# Patient Record
Sex: Female | Born: 1970
Health system: Southern US, Community
[De-identification: ages and names within clinical notes are randomized; demographics above are authoritative.]

## PROBLEM LIST (undated history)

## (undated) DIAGNOSIS — K561 Intussusception: Secondary | ICD-10-CM

## (undated) DIAGNOSIS — K559 Vascular disorder of intestine, unspecified: Secondary | ICD-10-CM

## (undated) DIAGNOSIS — K589 Irritable bowel syndrome without diarrhea: Secondary | ICD-10-CM

## (undated) DIAGNOSIS — K8020 Calculus of gallbladder without cholecystitis without obstruction: Secondary | ICD-10-CM

## (undated) DIAGNOSIS — J309 Allergic rhinitis, unspecified: Secondary | ICD-10-CM

## (undated) DIAGNOSIS — J45909 Unspecified asthma, uncomplicated: Secondary | ICD-10-CM

## (undated) HISTORY — DX: Vascular disorder of intestine, unspecified: K55.9

## (undated) HISTORY — DX: Irritable bowel syndrome, unspecified: K58.9

## (undated) HISTORY — DX: Intussusception: K56.1

## (undated) HISTORY — DX: Allergic rhinitis, unspecified: J30.9

## (undated) HISTORY — DX: Unspecified asthma, uncomplicated: J45.909

## (undated) HISTORY — DX: Calculus of gallbladder without cholecystitis without obstruction: K80.20

---

## 2007-04-23 DIAGNOSIS — K559 Vascular disorder of intestine, unspecified: Secondary | ICD-10-CM

## 2007-04-23 DIAGNOSIS — K561 Intussusception: Secondary | ICD-10-CM

## 2007-04-23 DIAGNOSIS — K8020 Calculus of gallbladder without cholecystitis without obstruction: Secondary | ICD-10-CM

## 2007-04-23 HISTORY — DX: Vascular disorder of intestine, unspecified: K55.9

## 2007-04-23 HISTORY — PX: COLONOSCOPY: SHX174

## 2007-04-23 HISTORY — DX: Intussusception: K56.1

## 2007-04-23 HISTORY — DX: Calculus of gallbladder without cholecystitis without obstruction: K80.20

## 2007-04-25 ENCOUNTER — Inpatient Hospital Stay (HOSPITAL_COMMUNITY): Admission: EM | Admit: 2007-04-25 | Discharge: 2007-04-27 | Payer: Self-pay | Admitting: Emergency Medicine

## 2007-04-27 ENCOUNTER — Encounter: Payer: Self-pay | Admitting: Internal Medicine

## 2007-05-01 ENCOUNTER — Ambulatory Visit: Payer: Self-pay | Admitting: Internal Medicine

## 2007-05-13 ENCOUNTER — Ambulatory Visit: Payer: Self-pay | Admitting: Internal Medicine

## 2007-05-13 ENCOUNTER — Encounter: Payer: Self-pay | Admitting: Internal Medicine

## 2007-05-13 DIAGNOSIS — R142 Eructation: Secondary | ICD-10-CM

## 2007-05-13 DIAGNOSIS — R143 Flatulence: Secondary | ICD-10-CM

## 2007-05-13 DIAGNOSIS — R141 Gas pain: Secondary | ICD-10-CM | POA: Insufficient documentation

## 2007-05-13 DIAGNOSIS — K55059 Acute (reversible) ischemia of intestine, part and extent unspecified: Secondary | ICD-10-CM | POA: Insufficient documentation

## 2007-05-13 DIAGNOSIS — K8000 Calculus of gallbladder with acute cholecystitis without obstruction: Secondary | ICD-10-CM | POA: Insufficient documentation

## 2007-05-13 DIAGNOSIS — J309 Allergic rhinitis, unspecified: Secondary | ICD-10-CM | POA: Insufficient documentation

## 2007-05-13 DIAGNOSIS — K561 Intussusception: Secondary | ICD-10-CM | POA: Insufficient documentation

## 2007-06-25 ENCOUNTER — Encounter: Payer: Self-pay | Admitting: Internal Medicine

## 2007-06-25 ENCOUNTER — Ambulatory Visit: Payer: Self-pay | Admitting: Internal Medicine

## 2007-06-25 DIAGNOSIS — K589 Irritable bowel syndrome without diarrhea: Secondary | ICD-10-CM | POA: Insufficient documentation

## 2008-09-13 ENCOUNTER — Encounter: Admission: RE | Admit: 2008-09-13 | Discharge: 2008-09-13 | Payer: Self-pay | Admitting: Family Medicine

## 2010-09-04 NOTE — Discharge Summary (Signed)
[name], [name] NO.:  [id number]   MEDICAL RECORD NO.:  [id number]          PATIENT TYPE:  INP   LOCATION:  1501                         FACILITY:  [facility]   PHYSICIAN:  Beckey Rutter, MD  DATE OF BIRTH:  1970/12/04   DATE OF ADMISSION:  04/25/2007  DATE OF DISCHARGE:  04/27/2007                               DISCHARGE SUMMARY   CHIEF COMPLAINT:  Abdominal pain.   HISTORY OF PRESENT ILLNESS:  A 39 year old who came in with abdominal  pain to the emergency room.  The abdominal pain was associated with  increased loose stool.   HOSPITAL COURSE:  During hospitalization, the patient was started on  antibiotics because of suspicion of infectious colitis.  The patient  underwent colonoscopy today.  The result was indicative for ischemic  colitis.  Please refer to the result of the colonoscopy report down  below.   The patient today is stable after the colonoscopy.  Her abdomen is soft.  She tolerated low-residue meals very good with no pain.  The patient  will be discharged today after her second meal if she remained without  pain as well.  The plan is to follow up with Dr. Leone Payor on January 21,  at 2:15 p.m.  The patient is aware and agreeable to the plan.   The patient will be discharged with Levsin sublingual p.r.n.   CONSULTATIONS:  Gastroenterology.  The patient had colonoscopy done  today by Dr. Leone Payor.   PROCEDURES:  1. Colonoscopy showing one intermittent inflammatory change and some      linear ulcer from sigmoid to the transverse colon about 40 cm.      Clinical scenario seems most compatible with ischemic colitis. but      inflammatory bowel disease like Crohn's could show these changes      also.  Otherwise okay at the terminal ileum.  I think jejunal      intussusception and gallstones are incidental findings and not      related to her colitis.  2. CT of her abdomen and pelvis with findings of cholelithiasis,      segment of jejunal  intussusception in the left mid abdomen which      has been described as physiologic process or incidental finding,      but recommend clinical correlation and potential additional imaging      as clinically warranted.  Bowel wall thickening of transverse      descending colon compatible with colitis.  Differential diagnosis      favoring infectious versus inflammatory bowel disease in a patient      of this age.  Her CT pelvis reading nonspecific free pelvic fluid,      colonic wall thickening compatible with colitis of the transverse      and descending colon.  In regards to her cholelithiasis and      intussusception, it was felt this is an incidental finding.  The      patient now is stable, tolerating food and she agreed to the plan      of the followup for further address  of this issue if needed.   DISCHARGE MEDICATIONS:  Levsin 0.125 mg sublingual q.4 h. p.r.n.,  prescription given.   DISCHARGE DIAGNOSES:  1. Ischemic colitis with the possibility of inflammatory bowel disease      and Crohn's disease to be further ruled out.  2. Cholelithiasis.  3. Intussusception, resolved.   FOLLOW UP:  The patient has agreed to the discharge plan and she agreed  to follow up with Dr. Leone Payor on January 21, at 2:15 p.m.      Beckey Rutter, MD  Electronically Signed     EME/MEDQ  D:  04/27/2007  T:  04/28/2007  Job:  [id number]

## 2010-09-04 NOTE — H&P (Signed)
[name], [name] NO.:  [id number]   MEDICAL RECORD NO.:  [id number]          PATIENT TYPE:  INP   LOCATION:  1501                         FACILITY:  [facility]   PHYSICIAN:  Della Goo, M.D. DATE OF BIRTH:  [birth year]   DATE OF ADMISSION:  04/25/2007  DATE OF DISCHARGE:                              HISTORY & PHYSICAL   CHIEF COMPLAINT:  Abdominal pain.   HISTORY OF PRESENT ILLNESS:  This is a 40 year old female presenting to  the emergency department with complaints of severe, left, lower quadrant  abdominal pain since January 1, associated with increased loose stools  three daily.  She reports having severe low back pain on December 31,  and presented to the [facility], was evaluated and given  medication of Hydrocodone, Flexeril and diclofenac.  She began to  develop left, lower quadrant pain the next day.  She reports that the  back pain resolved, however, she began to have constant left, lower  quadrant abdominal pain.  She denies having any melena, any nausea or  vomiting.  She denies having any fevers or chills.   PAST MEDICAL HISTORY:  None.   PAST SURGICAL HISTORY:  None.   MEDICATIONS:  Mentioned above, also Loestrin FE 1/20, 28-day pack.   ALLERGIES:  NO KNOWN DRUG ALLERGIES.   SOCIAL HISTORY:  Married, nonsmoker, nondrinker.   FAMILY HISTORY:  No history of coronary artery disease, hypertension,  diabetes or cancer that she knows of.  No history of colitis that she  knows of.   PHYSICAL EXAMINATION:  GENERAL:  This is a pleasant, well-nourished,  well-developed, 40 year old female in discomfort, but no acute distress.  VITAL SIGNS:  Temperature 99.2, blood pressure 121/60, heart rate 115,  respirations 16, O2 saturations 100% on room air.  HEENT:  Normocephalic, atraumatic.  Pupils equally round reactive to light.  Extraocular muscles are intact.  There is no scleral icterus.  Funduscopic benign.  Oropharynx is clear.  NECK:  Supple with full range of motion.  No thyromegaly, adenopathy,  jugular venous distention.  CARDIOVASCULAR:  Mild tachycardiac rate and rhythm.  No murmurs, gallops  or rubs.  LUNGS:  Clear to auscultation bilaterally.  ABDOMEN:  Positive bowel sounds, mild tenderness in left lower quadrant  area.  No guarding or rebound.  No hepatosplenomegaly. EXTREMITIES:  Without cyanosis, clubbing or edema.  NEUROLOGIC:  Nonfocal.   LABORATORY DATA AND X-RAY FINDINGS:  White blood cell count 21.3,  hemoglobin 12.9, hematocrit 37, platelets 275, neutrophils 91%,  lymphocytes 7%.  Sodium 134, potassium 4.1, carbon dioxide 27, chloride  102, BUN 9, creatinine 0.85 and glucose 105.  Albumin 3.2, AST 80, ALT  18.  Urinalysis with small urine hemoglobin, otherwise negative.  Urine  pregnancy negative.   CT scan of the abdomen and pelvis reveals cholelithiasis, jejunal  intussusception of the left mid abdomen that was incidentally found,  bowel wall thickening of the transverse and descending colon compatible  with colitis.  No pelvic mass, adenopathy or hernia seen in the pelvic  portion.  Nonspecific free pelvic fluid.   ASSESSMENT:  1. A  40 year old female being admitted with left lower quadrant      abdominal pain.  2. Colitis, possibly infectious versus inflammatory.  3. Mild jejunal intussusception.  4. Leukocytosis.   PLAN:  The patient will be admitted and placed on bowel rest.  IV fluids  have been ordered for hydration.  Antispasmodics have been ordered as  well p.r.n.  The patient will be placed on IV antibiotic therapy of  Cipro and Flagyl.  DVT and GI process prophylaxis have been ordered and  GI has been consulted.  The consultant on call was Dr. Elnoria Howard, who will  evaluate the patient this evening.      Della Goo, M.D.  Electronically Signed     HJ/MEDQ  D:  04/26/2007  T:  04/27/2007  Job:  [id number]

## 2010-09-04 NOTE — Consult Note (Signed)
[name], TINDEL NO.:  [id number]   MEDICAL RECORD NO.:  [id number]          PATIENT TYPE:  INP   LOCATION:  1501                         FACILITY:  [facility]   PHYSICIAN:  Jordan Hawks. Elnoria Howard, MD    DATE OF BIRTH:  09/14/1970   DATE OF CONSULTATION:  04/25/2007  DATE OF DISCHARGE:                                 CONSULTATION   REASON FOR CONSULTATION:  Abnormal CT scan revealing colitis.   REFERRING PHYSICIAN:  [facility].   HISTORY OF PRESENT ILLNESS:  This is a 40 year-old female with no  significant past medical history who presents to the hospital with  complaints of left lower quadrant abdominal pain.  The patient states  that on April 22, 2007 she acutely experienced lower back pain and  subsequently presented to the [facility] and she was treated  with Flexeril that helped to improve her symptoms.  However, on the  following day she started to experience left lower quadrant abdominal  pain that was associated with loose intermittent diarrhea.  The patient  denies any hematochezia or melena and no fever.  She subsequently  represented back to the [facility] physician and a CBC was performed which  revealed a significant leukocytosis.  She was then recommended to  present to the emergency room for further evaluation and treatment.  A  CT scan of the abdomen and pelvis was performed and there was evidence  of bowel wall thickening in the transverse and descending colon that has  felt to be the compatible with colitis.  Additionally there was an area  of jejunal intussusception, however, the patient denied having any  nausea or vomiting and the pain did not appear to correlate with the  site of the small bowel intussusception.  Subsequently GI consultation  was requested for further evaluation and treatment.   PAST MEDICAL AND SURGICAL HISTORY:  As stated above.   MEDICATIONS:  None.   ALLERGIES:  NONE.   REVIEW OF SYSTEMS:  As stated above in the  history of present illness  otherwise negative.   FAMILY HISTORY:  Noncontributory.   SOCIAL HISTORY:  Negative for alcohol, tobacco or illicit drug use.  She  is married with children.   PHYSICAL EXAMINATION:  VITAL SIGNS:  Stable.  GENERAL:  The patient is in no acute distress.  Alert and oriented.  HEENT:  Normocephalic, atraumatic.  Extraocular muscles intact.  NECK:  Supple.  No lymphadenopathy.  LUNGS:  Clear to auscultation bilaterally.  CARDIOVASCULAR:  Regular rate and rhythm.  ABDOMEN:  Flat, soft.  Tender in the left lower quadrant.  No rebound or  rigidity.  Positive bowel sounds.  RECTAL:  Negative for any palpable masses but heme positive.  EXTREMITIES:  No clubbing, cyanosis or edema.   LABORATORY DATA:  White blood cell count 21.3, hemoglobin 12.9,  platelets 275.  Sodium 134, potassium 4.1, chloride 102, CO2 27, glucose  105, BUN 5, creatinine 0.8.  Total bilirubin 1.2, alk phos is 52, AST  18, ALT 18 and albumin is 2.2.   IMPRESSION:  1. Colitis.  2. Jejunal intussusception.  The patient is noted to have heme-positive stool as well as colitis on  CT scan.  Further evaluation with a colonoscopy will be required as I am  uncertain if this is an infectious etiology versus an inflammatory bowel  disease.  Additionally the patient is noted to have intussusception and  this does not appear to have any clinical bearing at this time.  She  does not have any symptoms consistent with the intussusception currently  and this issue will be followed clinically.   PLAN:  The patient will undergo a colonoscopy on April 27, 2007.  Further recommendations and treatment will be pending the findings.      Jordan Hawks Elnoria Howard, MD  Electronically Signed     PDH/MEDQ  D:  04/25/2007  T:  04/26/2007  Job:  [id number]

## 2010-09-04 NOTE — Consult Note (Signed)
NAMEJENISHA, Darlene NO.:  [id number]   MEDICAL RECORD NO.:  [id number]          PATIENT TYPE:  INP   LOCATION:  1501                         FACILITY:  [facility]   PHYSICIAN:  Iva Boop, MD,FACGDATE OF BIRTH:  1970-11-25   DATE OF CONSULTATION:  04/27/2007  DATE OF DISCHARGE:                                 CONSULTATION   FOLLOW UP CONSULTATION NOTE:  [name] is a 40 year old white woman seen in consultation by Dr.  Elnoria Howard while he was covering our service.  I performed a colonoscopy today  because CT scanning demonstrated thickening of the transverse and left  colon.  She had linear ulcers and patchy and intermittent areas of  inflammatory change from the proximal sigmoid to the transverse colon.  I think this is most likely ischemic colitis, but as I explained to the  patient and her husband, Crohn's colitis is possible.  The terminal  ileum was normal.   She is clinically much improved, and it was my recommendation that she  go home either later today or tomorrow, pending rounding by the  [facility] hospitalist.  I have made her an appointment to see me in 2  weeks with plans for the use of Levsin SL intermittently as needed.  Further plans depending upon what the biopsies show.  At this point, my  working diagnosis is that she suffered acute ischemic colitis probably  as a result of vasospasm.  The distribution in her colon is a little  broader than usual (i.e. it usually does not go much beyond the splenic  flexure, though).  She seemed to have inflammatory findings up to about  70 or 80 cm which would put that in the range of the watershed areas, so  that does still seem to be the most likely diagnosis.      Iva Boop, MD,FACG  Electronically Signed     CEG/MEDQ  D:  04/27/2007  T:  04/27/2007  Job:  [id number]

## 2011-01-10 LAB — COMPREHENSIVE METABOLIC PANEL
ALT: 18
AST: 18
Albumin: 3.2 — ABNORMAL LOW
Alkaline Phosphatase: 52
BUN: 9
CO2: 27
Calcium: 8.5
Chloride: 102
Creatinine, Ser: 0.85
GFR calc non Af Amer: >60
Glucose, Bld: 105 — ABNORMAL HIGH
Potassium: 4.1
Sodium: 134 — ABNORMAL LOW
Total Bilirubin: 1.2
Total Protein: 6.5

## 2011-01-10 LAB — CBC
HCT: 33.2 — ABNORMAL LOW
HCT: 37
Hemoglobin: 11.5 — ABNORMAL LOW
Hemoglobin: 12.9
MCHC: 34.7
MCHC: 34.9
MCV: 84.3
MCV: 84.5
Platelets: 275
Platelets: 287
RBC: 3.93
RBC: 4.4
RDW: 12.5
RDW: 12.8
WBC: 10.2
WBC: 21.3 — ABNORMAL HIGH

## 2011-01-10 LAB — CLOSTRIDIUM DIFFICILE EIA: C difficile Toxins A+B, EIA: NEGATIVE

## 2011-01-10 LAB — DIFFERENTIAL
Basophils Absolute: 0
Basophils Absolute: 0
Basophils Relative: 0
Basophils Relative: 0
Eosinophils Absolute: 0.1
Eosinophils Absolute: 0.5
Eosinophils Relative: 1
Eosinophils Relative: 5
Lymphocytes Relative: 15
Lymphocytes Relative: 7 — ABNORMAL LOW
Lymphs Abs: 1.5
Lymphs Abs: 1.5
Monocytes Absolute: 0.4
Monocytes Absolute: 0.5
Monocytes Relative: 2 — ABNORMAL LOW
Monocytes Relative: 5
Neutro Abs: 19.3 — ABNORMAL HIGH
Neutro Abs: 7.6
Neutrophils Relative %: 74
Neutrophils Relative %: 91 — ABNORMAL HIGH

## 2011-01-10 LAB — URINALYSIS, ROUTINE W REFLEX MICROSCOPIC
Bilirubin Urine: NEGATIVE
Glucose, UA: NEGATIVE
Ketones, ur: NEGATIVE
Leukocytes, UA: NEGATIVE
Nitrite: NEGATIVE
Protein, ur: NEGATIVE
Specific Gravity, Urine: 1.008
Urobilinogen, UA: 0.2
pH: 6

## 2011-01-10 LAB — URINE MICROSCOPIC-ADD ON

## 2011-01-10 LAB — PREGNANCY, URINE

## 2013-07-08 ENCOUNTER — Ambulatory Visit (INDEPENDENT_AMBULATORY_CARE_PROVIDER_SITE_OTHER): Payer: BC Managed Care – PPO | Admitting: Internal Medicine

## 2013-07-08 ENCOUNTER — Other Ambulatory Visit (INDEPENDENT_AMBULATORY_CARE_PROVIDER_SITE_OTHER): Payer: BC Managed Care – PPO

## 2013-07-08 ENCOUNTER — Encounter: Payer: Self-pay | Admitting: Internal Medicine

## 2013-07-08 VITALS — BP 92/60 | HR 72 | Ht 64.57 in | Wt 133.4 lb

## 2013-07-08 DIAGNOSIS — K561 Intussusception: Secondary | ICD-10-CM

## 2013-07-08 DIAGNOSIS — R141 Gas pain: Secondary | ICD-10-CM

## 2013-07-08 DIAGNOSIS — IMO0001 Reserved for inherently not codable concepts without codable children: Secondary | ICD-10-CM

## 2013-07-08 DIAGNOSIS — E739 Lactose intolerance, unspecified: Secondary | ICD-10-CM

## 2013-07-08 DIAGNOSIS — R140 Abdominal distension (gaseous): Secondary | ICD-10-CM

## 2013-07-08 DIAGNOSIS — R198 Other specified symptoms and signs involving the digestive system and abdomen: Secondary | ICD-10-CM

## 2013-07-08 DIAGNOSIS — R143 Flatulence: Secondary | ICD-10-CM

## 2013-07-08 DIAGNOSIS — K589 Irritable bowel syndrome without diarrhea: Secondary | ICD-10-CM

## 2013-07-08 DIAGNOSIS — R142 Eructation: Secondary | ICD-10-CM

## 2013-07-08 DIAGNOSIS — R1915 Other abnormal bowel sounds: Secondary | ICD-10-CM

## 2013-07-08 LAB — CBC WITH DIFFERENTIAL/PLATELET
Basophils Absolute: 0.1 10*3/uL (ref 0.0–0.1)
Basophils Relative: 0.6 % (ref 0.0–3.0)
Eosinophils Absolute: 0.3 10*3/uL (ref 0.0–0.7)
Eosinophils Relative: 2.7 % (ref 0.0–5.0)
HCT: 39.5 % (ref 36.0–46.0)
Hemoglobin: 13.1 g/dL (ref 12.0–15.0)
Lymphocytes Relative: 20.6 % (ref 12.0–46.0)
Lymphs Abs: 2.2 10*3/uL (ref 0.7–4.0)
MCHC: 33.2 g/dL (ref 30.0–36.0)
MCV: 88.1 fl (ref 78.0–100.0)
Monocytes Absolute: 0.9 10*3/uL (ref 0.1–1.0)
Monocytes Relative: 8.1 % (ref 3.0–12.0)
Neutro Abs: 7.3 10*3/uL (ref 1.4–7.7)
Neutrophils Relative %: 68 % (ref 43.0–77.0)
Platelets: 253 10*3/uL (ref 150.0–400.0)
RBC: 4.48 Mil/uL (ref 3.87–5.11)
RDW: 13.4 % (ref 11.5–14.6)
WBC: 10.8 10*3/uL — ABNORMAL HIGH (ref 4.5–10.5)

## 2013-07-08 LAB — COMPREHENSIVE METABOLIC PANEL
ALT: 30 U/L (ref 0–35)
AST: 26 U/L (ref 0–37)
Albumin: 4.5 g/dL (ref 3.5–5.2)
Alkaline Phosphatase: 59 U/L (ref 39–117)
BUN: 15 mg/dL (ref 6–23)
CO2: 25 mEq/L (ref 19–32)
Calcium: 9.1 mg/dL (ref 8.4–10.5)
Chloride: 104 mEq/L (ref 96–112)
Creatinine, Ser: 0.8 mg/dL (ref 0.4–1.2)
GFR: 82.74 mL/min (ref >60.00)
Glucose, Bld: 96 mg/dL (ref 70–99)
Potassium: 4.5 mEq/L (ref 3.5–5.1)
Sodium: 139 mEq/L (ref 135–145)
Total Bilirubin: 0.8 mg/dL (ref 0.3–1.2)
Total Protein: 7.4 g/dL (ref 6.0–8.3)

## 2013-07-08 LAB — IGA: IgA: 374 mg/dL (ref 68–378)

## 2013-07-08 LAB — C-REACTIVE PROTEIN: CRP: 0.6 mg/dL (ref 0.5–20.0)

## 2013-07-08 LAB — TSH: TSH: 1.76 u[IU]/mL (ref 0.35–5.50)

## 2013-07-08 NOTE — Progress Notes (Signed)
         Subjective:    Patient ID: Darlene ReachSandra Hoover, female    DOB: 1971-03-23, 43 y.o.   MRN: [id number]  HPI The patient is a very pleasant woman, originally from [place],  here after > 3 year absence with multiple GI c/o. She is having frequent borborygmi, bloating, and incomplete defecation. She has a hx of IBS and has had ischemic colitis. She has kept a food diary but has not noticed any consistent food triggers though says I told her she was lactose intolerant and believes she is. She had a negative colonoscopy + bx in 2009. Many of her symptoms are post-prandial. She has tried yogurt with active cx but no pro-biotics. Sleep is not disturbed by GI sxs. Weight stable, appetite ok. No diarrhea or constipation per se, just the senes of incomplete defecation after typical AM movement. No Known Allergies No outpatient prescriptions prior to visit.   No facility-administered medications prior to visit.   Past Medical History  Diagnosis Date  . Ischemic colitis 1/09  . Allergic rhinitis   . Gallstones 1/09    on CT  . Jejunal intussusception 1/09    on CT  . IBS (irritable bowel syndrome)   . Asthma    Past Surgical History  Procedure Laterality Date  . Colonoscopy  1/09   History   Social History  . Marital Status: Married    Spouse Name: N/A    Number of Children: 2  . Years of Education: N/A   Occupational History  . stay at home mom    Social History Main Topics  . Smoking status: Never Smoker   . Smokeless tobacco: Never Used  . Alcohol Use: Yes     Comment: occasional  . 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 No pertinent family history.   No colon cancer    Review of Systems + some back pain, allergies All other ROS negative    Objective:   Physical Exam General:  Well-developed, well-nourished and in no  acute distress Eyes:  anicteric. ENT:   Mouth and posterior pharynx free of lesions.  Neck:   supple w/o thyromegaly or mass.  Lungs: Clear to auscultation bilaterally. Heart:  S1S2, no rubs, murmurs, gallops. Abdomen:  soft, non-tender, no hepatosplenomegaly, hernia, or mass and BS+.  Lymph:  no cervical or supraclavicular adenopathy. Extremities:   no edema Skin   no rash. Neuro:  A&O x 3.  Psych:  appropriate mood and  Affect.   Data Reviewed:  2009 GI notes, colonoscopy, pathology    Assessment & Plan:  Intussusception ? If this is a sign of organic illness or just an incidental finding back in 2009 - favor latter  Irritable bowel syndrome I still think this is main issue. Will screen for celiac disease with labs. Also cbc cmet, crp, tsh Have recommended she try a FODMAPS diet since most of her sxs are bloating/gas related Also add Align qd F/u 2 months   Lactose intolerance By history   WU:JWJXBJY,NWGNFACc:WOLTERS,SHARON A, MD

## 2013-07-08 NOTE — Patient Instructions (Addendum)
Your physician has requested that you go to the basement for lab work before leaving today.  Please purchase Align. This puts good bacteria back into your colon. You should take 1 capsule by mouth once daily. If this works well for you, it can be purchased over the counter.  Today you have been given a FOD map to read and follow.  Follow up with us in 2 months.   I appreciate the opportunity to care for you.

## 2013-07-09 LAB — TISSUE TRANSGLUTAMINASE, IGA: Tissue Transglutaminase Ab, IgA: 4.4 U/mL (ref <20)

## 2013-07-10 ENCOUNTER — Encounter: Payer: Self-pay | Admitting: Internal Medicine

## 2013-07-10 DIAGNOSIS — E739 Lactose intolerance, unspecified: Secondary | ICD-10-CM | POA: Insufficient documentation

## 2013-07-10 NOTE — Assessment & Plan Note (Addendum)
I still think this is main issue. Will screen for celiac disease with labs. Also cbc cmet, crp, tsh Have recommended she try a FODMAPS diet since most of her sxs are bloating/gas related Also add Align qd F/u 2 months

## 2013-07-10 NOTE — Assessment & Plan Note (Signed)
By history

## 2013-07-10 NOTE — Assessment & Plan Note (Signed)
?   If this is a sign of organic illness or just an incidental finding back in 2009 - favor latter

## 2013-07-13 ENCOUNTER — Telehealth: Payer: Self-pay | Admitting: *Deleted

## 2013-07-13 NOTE — Progress Notes (Signed)
Quick Note:  Let her know testing for celiac disease (gluten allergy) is negative. ______

## 2013-07-13 NOTE — Telephone Encounter (Signed)
Error

## 2013-09-08 ENCOUNTER — Encounter: Payer: Self-pay | Admitting: Internal Medicine

## 2013-09-08 ENCOUNTER — Ambulatory Visit (INDEPENDENT_AMBULATORY_CARE_PROVIDER_SITE_OTHER): Payer: BC Managed Care – PPO | Admitting: Internal Medicine

## 2013-09-08 VITALS — BP 100/60 | HR 64 | Ht 64.5 in | Wt 126.8 lb

## 2013-09-08 DIAGNOSIS — K589 Irritable bowel syndrome without diarrhea: Secondary | ICD-10-CM

## 2013-09-08 NOTE — Assessment & Plan Note (Signed)
Much improved with FODMAPS diet She may re-introduce certain foods to see if she an tolerat (misses apples) May stop Align and see

## 2013-09-08 NOTE — Progress Notes (Signed)
         Subjective:    Patient ID: [name], female    DOB: 1970-04-29, 43 y.o.   MRN: [id number]  HPI Ms. [name] reports much less bloating and reduced weight on FODMAPS diet and Align. She still experiences some bororygmi after meals - variable.  No Known Allergies Outpatient Prescriptions Prior to Visit  Medication Sig Dispense Refill  . cetirizine (ZYRTEC) 10 MG tablet Take 10 mg by mouth daily.      . cyclobenzaprine (FLEXERIL) 10 MG tablet Take 10 mg by mouth as needed.       . diclofenac (VOLTAREN) 75 MG EC tablet Take 75 mg by mouth 2 (two) times daily.      . Glucosamine HCl (GLUCOSAMINE PO) Take 1 tablet by mouth 2 (two) times daily.      . montelukast (SINGULAIR) 10 MG tablet Take 10 mg by mouth daily.       . Multiple Vitamin (MULTIVITAMIN) tablet Take 1 tablet by mouth daily.      . VENTOLIN HFA 108 (90 BASE) MCG/ACT inhaler Inhale 2 puffs into the lungs. Before exercise       No facility-administered medications prior to visit.   Past Medical History  Diagnosis Date  . Ischemic colitis 1/09  . Allergic rhinitis   . Gallstones 1/09    on CT  . Jejunal intussusception 1/09    on CT  . IBS (irritable bowel syndrome)   . Asthma    Past Surgical History  Procedure Laterality Date  . Colonoscopy  1/09   Review of Systems As above    Objective:   Physical Exam WDWN NAD       Assessment & Plan:  Irritable bowel syndrome Much improved with FODMAPS diet She may re-introduce certain foods to see if she an tolerat (misses apples) May stop Align and see   WU:JWJXBJY,NWGNFACc:WOLTERS,SHARON A, MD

## 2013-09-08 NOTE — Patient Instructions (Signed)
Follow up with us as needed.   I appreciate the opportunity to care for you.  

## 2014-04-01 ENCOUNTER — Other Ambulatory Visit: Payer: Self-pay | Admitting: Obstetrics and Gynecology

## 2014-04-05 LAB — CYTOLOGY - PAP

## 2015-05-30 ENCOUNTER — Other Ambulatory Visit: Payer: Self-pay | Admitting: Neurological Surgery

## 2015-05-30 DIAGNOSIS — M545 Low back pain: Principal | ICD-10-CM

## 2015-05-30 DIAGNOSIS — G8929 Other chronic pain: Secondary | ICD-10-CM

## 2015-06-06 ENCOUNTER — Ambulatory Visit
Admission: RE | Admit: 2015-06-06 | Discharge: 2015-06-06 | Disposition: A | Discharge status: Home / Self Care | Payer: BLUE CROSS/BLUE SHIELD | Source: Ambulatory Visit | Attending: Neurological Surgery | Admitting: Neurological Surgery

## 2015-06-06 DIAGNOSIS — M545 Low back pain, unspecified: Secondary | ICD-10-CM

## 2015-06-06 DIAGNOSIS — G8929 Other chronic pain: Secondary | ICD-10-CM

## 2016-02-28 DIAGNOSIS — F4323 Adjustment disorder with mixed anxiety and depressed mood: Secondary | ICD-10-CM | POA: Diagnosis not present

## 2016-03-11 DIAGNOSIS — F4323 Adjustment disorder with mixed anxiety and depressed mood: Secondary | ICD-10-CM | POA: Diagnosis not present

## 2016-04-01 DIAGNOSIS — F4323 Adjustment disorder with mixed anxiety and depressed mood: Secondary | ICD-10-CM | POA: Diagnosis not present

## 2016-04-23 DIAGNOSIS — F4323 Adjustment disorder with mixed anxiety and depressed mood: Secondary | ICD-10-CM | POA: Diagnosis not present

## 2016-05-15 DIAGNOSIS — F4323 Adjustment disorder with mixed anxiety and depressed mood: Secondary | ICD-10-CM | POA: Diagnosis not present

## 2016-05-29 DIAGNOSIS — F4323 Adjustment disorder with mixed anxiety and depressed mood: Secondary | ICD-10-CM | POA: Diagnosis not present

## 2016-05-29 DIAGNOSIS — D225 Melanocytic nevi of trunk: Secondary | ICD-10-CM | POA: Diagnosis not present

## 2016-05-29 DIAGNOSIS — L700 Acne vulgaris: Secondary | ICD-10-CM | POA: Diagnosis not present

## 2016-05-29 DIAGNOSIS — L738 Other specified follicular disorders: Secondary | ICD-10-CM | POA: Diagnosis not present

## 2016-06-18 DIAGNOSIS — F4323 Adjustment disorder with mixed anxiety and depressed mood: Secondary | ICD-10-CM | POA: Diagnosis not present

## 2016-07-03 DIAGNOSIS — F4323 Adjustment disorder with mixed anxiety and depressed mood: Secondary | ICD-10-CM | POA: Diagnosis not present

## 2016-07-10 DIAGNOSIS — Z1231 Encounter for screening mammogram for malignant neoplasm of breast: Secondary | ICD-10-CM | POA: Diagnosis not present

## 2016-07-10 DIAGNOSIS — Z6821 Body mass index (BMI) 21.0-21.9, adult: Secondary | ICD-10-CM | POA: Diagnosis not present

## 2016-07-10 DIAGNOSIS — Z01419 Encounter for gynecological examination (general) (routine) without abnormal findings: Secondary | ICD-10-CM | POA: Diagnosis not present

## 2016-07-16 DIAGNOSIS — F4323 Adjustment disorder with mixed anxiety and depressed mood: Secondary | ICD-10-CM | POA: Diagnosis not present

## 2016-07-29 DIAGNOSIS — N921 Excessive and frequent menstruation with irregular cycle: Secondary | ICD-10-CM | POA: Diagnosis not present

## 2016-07-29 DIAGNOSIS — N926 Irregular menstruation, unspecified: Secondary | ICD-10-CM | POA: Diagnosis not present

## 2016-08-01 DIAGNOSIS — F4323 Adjustment disorder with mixed anxiety and depressed mood: Secondary | ICD-10-CM | POA: Diagnosis not present

## 2016-08-02 ENCOUNTER — Ambulatory Visit (INDEPENDENT_AMBULATORY_CARE_PROVIDER_SITE_OTHER): Payer: BLUE CROSS/BLUE SHIELD

## 2016-08-02 ENCOUNTER — Encounter: Payer: Self-pay | Admitting: Podiatry

## 2016-08-02 ENCOUNTER — Telehealth: Payer: Self-pay | Admitting: *Deleted

## 2016-08-02 ENCOUNTER — Ambulatory Visit (INDEPENDENT_AMBULATORY_CARE_PROVIDER_SITE_OTHER): Payer: BLUE CROSS/BLUE SHIELD | Admitting: Podiatry

## 2016-08-02 DIAGNOSIS — M722 Plantar fascial fibromatosis: Secondary | ICD-10-CM

## 2016-08-02 DIAGNOSIS — M2010 Hallux valgus (acquired), unspecified foot: Secondary | ICD-10-CM

## 2016-08-02 MED ORDER — TRIAMCINOLONE ACETONIDE 10 MG/ML IJ SUSP: 10.0000 mg | Freq: Once | INTRAMUSCULAR | Status: AC

## 2016-08-02 NOTE — Patient Instructions (Signed)
Bunionectomy A bunionectomy is a surgical procedure to remove a bunion. A bunion is a visible bump of bone on the inside of your foot where your big toe meets the rest of your foot. A bunion can develop when pressure turns this bone (first metatarsal) toward the other toes. Shoes that are too tight are the most common cause of bunions. Bunions can also be caused by diseases, such as arthritis and polio. You may need a bunionectomy if your bunion is very large and painful or it affects your ability to walk. Tell a health care provider about:  Any allergies you have.  All medicines you are taking, including vitamins, herbs, eye drops, creams, and over-the-counter medicines.  Any problems you or family members have had with anesthetic medicines.  Any blood disorders you have.  Any surgeries you have had.  Any medical conditions you have. What are the risks? Generally, this is a safe procedure. However, problems may occur, including:  Infection.  Pain.  Nerve damage.  Bleeding or blood clots.  Reactions to medicines.  Numbness, stiffness, or arthritis in your toe.  Foot problems that continue even after the procedure. What happens before the procedure?  Ask your health care provider about:  Changing or stopping your regular medicines. This is especially important if you are taking diabetes medicines or blood thinners.  Taking medicines such as aspirin and ibuprofen. These medicines can thin your blood. Do not take these medicines before your procedure if your health care provider instructs you not to.  Do not drink alcohol before the procedure as directed by your health care provider.  Do not use tobacco products, including cigarettes, chewing tobacco, or electronic cigarettes, before the procedure as directed by your health care provider. If you need help quitting, ask your health care provider.  Ask your health care provider what kind of medicine you will be given during  your procedure. A bunionectomy may be done using one of these:  A medicine that numbs the area (local anesthetic).  A medicine that makes you go to sleep (general anesthetic). If you will be given general anesthetic, do not eat or drink anything after midnight on the night before the procedure or as directed by your health care provider. What happens during the procedure?  An IV tube may be inserted into a vein.  You will be given local anesthetic or general anesthetic.  The surgeon will make a cut (incision) over the enlarged area at the first joint of the big toe. The surgeon will remove the bunion.  You may have more than one incision if any of the bones in your big toe need to be moved. A bone itself may need to be cut.  Sometimes the tissues around the big toe may also need to be cut then tightened or loosened to reposition the toe.  Screws or other hardware may be used to keep your foot in thecorrect position.  The incision will be closed with stitches (sutures) and covered with adhesive strips or another type of bandage (dressing). What happens after the procedure?  You may spend some time in a recovery area.  Your blood pressure, heart rate, breathing rate, and blood oxygen level will be monitored often until the medicines you were given have worn off. This information is not intended to replace advice given to you by your health care provider. Make sure you discuss any questions you have with your health care provider. Document Released: 03/22/2005 Document Revised: 09/14/2015 Document Reviewed: 11/24/2013   Elsevier Interactive Patient Education  2017 [employer]  

## 2016-08-02 NOTE — Telephone Encounter (Signed)
"  I'm a patient of Dr. Beverlee Nims.  I saw him this morning.  He wants to do surgery but I need to know how much I am going to have to pay before I proceed.  Can you help me?"  I will get Chip Boer in insurance and billing to give you a call back with an estimate for the physician fees.  You will have to call the surgical center at [phone] to get the anesthesia and facility fees.  Your procedure will take about 30-45 minutes and the procedure code is 28[address].  They will need this information when you talk to them.

## 2016-08-04 NOTE — Progress Notes (Signed)
Subjective:     Patient ID: [name], female   DOB: 11/21/1970, 46 y.o.   MRN: [id number]  HPI patient presents stating she's had trouble with ongoing problems with bunion deformity left over right and also gets pain underneath the lesser metatarsals left and right with keratotic tissue and is developed exquisite discomfort in the plantar heel right at the insertional point of the tendon into the calcaneus. States the bunions are her major problem for the long-term but that the heel pain is major problem now   Review of Systems  All other systems reviewed and are negative.      Objective:   Physical Exam  Constitutional: She is oriented to person, place, and time.  Cardiovascular: Intact distal pulses.   Musculoskeletal: Normal range of motion.  Neurological: She is oriented to person, place, and time.  Skin: Skin is warm.  Nursing note and vitals reviewed.  neurovascular status intact muscle strength is adequate range of motion within normal limits with patient noted to have hyperostosis medial aspect first metatarsal head left over right with redness around the joint surface and pain and also noted to have exquisite discomfort of the plantar heel right with inflammation fluid buildup and mild keratotic tissue underneath the second third metatarsal left over right. Patient has good digital perfusion well oriented 3     Assessment:     Inflammatory capsulitis of the lesser MPJ left with structural bunion deformity bilateral with pain around the first metatarsal head bilateral and also inflammation with heel pain right    Plan:     H&P x-rays reviewed all conditions discussed. I do think ultimately will be best to go ahead and correct structural bunion deformity and I reviewed the procedure and what would be required and patient wants surgery on this particular problem. I'm going to focus on the heel first and today injected the right plantar fascia 3 mg Kenalog 5 mg Xylocaine and  applied fascial prep brace with instructions on usage. Patient will return for consult and wants surgery in the near future. I also discussed long-term orthotics for this patient  X-rays indicate elevation of the intermetatarsal angle bilateral with sesamoidal shift position noted bilateral

## 2016-08-08 ENCOUNTER — Telehealth: Payer: Self-pay | Admitting: *Deleted

## 2016-08-08 NOTE — Telephone Encounter (Signed)
"  I need to schedule my bunionectomy with Dr. Charlsie Merles."  Do you have a date in mind.  "I'd like to do it on May 22.  I am scheduled to see him tomorrow."  I will get it scheduled.

## 2016-08-09 ENCOUNTER — Ambulatory Visit (INDEPENDENT_AMBULATORY_CARE_PROVIDER_SITE_OTHER): Payer: BLUE CROSS/BLUE SHIELD | Admitting: Podiatry

## 2016-08-09 ENCOUNTER — Encounter: Payer: Self-pay | Admitting: Podiatry

## 2016-08-09 DIAGNOSIS — M2010 Hallux valgus (acquired), unspecified foot: Secondary | ICD-10-CM

## 2016-08-09 DIAGNOSIS — M722 Plantar fascial fibromatosis: Secondary | ICD-10-CM

## 2016-08-09 NOTE — Patient Instructions (Signed)
Pre-Operative Instructions  Congratulations, you have decided to take an important step to improving your quality of life.  You can be assured that the doctors of [facility] will be with you every step of the way.  1. Plan to be at the surgery center/hospital at least 1 (one) hour prior to your scheduled time unless otherwise directed by the surgical center/hospital staff.  You must have a responsible adult accompany you, remain during the surgery and drive you home.  Make sure you have directions to the surgical center/hospital and know how to get there on time. 2. For hospital based surgery you will need to obtain a history and physical form from your family physician within 1 month prior to the date of surgery- we will give you a form for you primary physician.  3. We make every effort to accommodate the date you request for surgery.  There are however, times where surgery dates or times have to be moved.  We will contact you as soon as possible if a change in schedule is required.   4. No Aspirin/Ibuprofen for one week before surgery.  If you are on aspirin, any non-steroidal anti-inflammatory medications (Mobic, Aleve, Ibuprofen) you should stop taking it 7 days prior to your surgery.  You make take Tylenol  For pain prior to surgery.  5. Medications- If you are taking daily heart and blood pressure medications, seizure, reflux, allergy, asthma, anxiety, pain or diabetes medications, make sure the surgery center/hospital is aware before the day of surgery so they may notify you which medications to take or avoid the day of surgery. 6. No food or drink after midnight the night before surgery unless directed otherwise by surgical center/hospital staff. 7. No alcoholic beverages 24 hours prior to surgery.  No smoking 24 hours prior to or 24 hours after surgery. 8. Wear loose pants or shorts- loose enough to fit over bandages, boots, and casts. 9. No slip on shoes, sneakers are best. 10. Bring  your boot with you to the surgery center/hospital.  Also bring crutches or a walker if your physician has prescribed it for you.  If you do not have this equipment, it will be provided for you after surgery. 11. If you have not been contracted by the surgery center/hospital by the day before your surgery, call to confirm the date and time of your surgery. 12. Leave-time from work may vary depending on the type of surgery you have.  Appropriate arrangements should be made prior to surgery with your employer. 13. Prescriptions will be provided immediately following surgery by your doctor.  Have these filled as soon as possible after surgery and take the medication as directed. 14. Remove nail polish on the operative foot. 15. Wash the night before surgery.  The night before surgery wash the foot and leg well with the antibacterial soap provided and water paying special attention to beneath the toenails and in between the toes.  Rinse thoroughly with water and dry well with a towel.  Perform this wash unless told not to do so by your physician.  Enclosed: 1 Ice pack (please put in freezer the night before surgery)   1 Hibiclens skin cleaner   Pre-op Instructions  If you have any questions regarding the instructions, do not hesitate to call our office.  [place]: [address] [place], [place] [address] [phone]  [place]: [address] [place], [place] [address] [phone]  [place]: [address]  [place], [place] [address] [phone]   Dr.   Cristie Hem DPM, Dr. Ovid Curd DPM, Dr. Ardelle Anton DPM, Dr. Asencion Islam DPM    Bunionectomy A bunionectomy is a surgical procedure to remove a bunion. A bunion is a visible bump of bone on the inside of your foot where your big toe meets the rest of your foot. A bunion can develop when pressure turns this bone (first metatarsal) toward the other toes. Shoes that are too tight are the most common cause of bunions. Bunions can also be caused  by diseases, such as arthritis and polio. You may need a bunionectomy if your bunion is very large and painful or it affects your ability to walk. Tell a health care provider about:  Any allergies you have.  All medicines you are taking, including vitamins, herbs, eye drops, creams, and over-the-counter medicines.  Any problems you or family members have had with anesthetic medicines.  Any blood disorders you have.  Any surgeries you have had.  Any medical conditions you have. What are the risks? Generally, this is a safe procedure. However, problems may occur, including:  Infection.  Pain.  Nerve damage.  Bleeding or blood clots.  Reactions to medicines.  Numbness, stiffness, or arthritis in your toe.  Foot problems that continue even after the procedure. What happens before the procedure?  Ask your health care provider about:  Changing or stopping your regular medicines. This is especially important if you are taking diabetes medicines or blood thinners.  Taking medicines such as aspirin and ibuprofen. These medicines can thin your blood. Do not take these medicines before your procedure if your health care provider instructs you not to.  Do not drink alcohol before the procedure as directed by your health care provider.  Do not use tobacco products, including cigarettes, chewing tobacco, or electronic cigarettes, before the procedure as directed by your health care provider. If you need help quitting, ask your health care provider.  Ask your health care provider what kind of medicine you will be given during your procedure. A bunionectomy may be done using one of these:  A medicine that numbs the area (local anesthetic).  A medicine that makes you go to sleep (general anesthetic). If you will be given general anesthetic, do not eat or drink anything after midnight on the night before the procedure or as directed by your health care provider. What happens during the  procedure?  An IV tube may be inserted into a vein.  You will be given local anesthetic or general anesthetic.  The surgeon will make a cut (incision) over the enlarged area at the first joint of the big toe. The surgeon will remove the bunion.  You may have more than one incision if any of the bones in your big toe need to be moved. A bone itself may need to be cut.  Sometimes the tissues around the big toe may also need to be cut then tightened or loosened to reposition the toe.  Screws or other hardware may be used to keep your foot in thecorrect position.  The incision will be closed with stitches (sutures) and covered with adhesive strips or another type of bandage (dressing). What happens after the procedure?  You may spend some time in a recovery area.  Your blood pressure, heart rate, breathing rate, and blood oxygen level will be monitored often until the medicines you were given have worn off. This information is not intended to replace advice given to you by your health care provider. Make sure  you discuss any questions you have with your health care provider. Document Released: 03/22/2005 Document Revised: 09/14/2015 Document Reviewed: 11/24/2013 Elsevier Interactive Patient Education  2017 [employer].

## 2016-08-11 NOTE — Progress Notes (Signed)
Subjective:     Patient ID: Lovina Reach, female   DOB: 1970-10-24, 46 y.o.   MRN: [id number]  HPI patient presents with husband to discuss chronic bunion deformity left that's been painful   Review of Systems  All other systems reviewed and are negative.      Objective:   Physical Exam  Constitutional: She is oriented to person, place, and time.  Cardiovascular: Intact distal pulses.   Musculoskeletal: Normal range of motion.  Neurological: She is oriented to person, place, and time.  Skin: Skin is warm.  Nursing note and vitals reviewed.  neurovascular status intact muscle strength adequate range of motion within normal limits with patient found to have clinical bunion deformity left over right with redness irritation and deviation the hallux with pain with palpation. Moderate flatfoot deformity is noted and family history noted     Assessment:     Structural HAV deformity left over right    Plan:     H&P condition reviewed with family. I've recommended Austin-type osteotomy and I reviewed with them x-rays and spent a great of time going over alternative treatments complications associated with procedure. They understand this want procedure and I did explain that radiographically I may not be able to get complete correction. I then went ahead and dispensed air fracture walker with all instructions on usage and for them to get used to it prior to procedure and I explained that recovery for this type of thing can take 6 months to one year for complete recovery. Patient is encouraged to call with questions and is scheduled for outpatient procedure

## 2016-08-22 DIAGNOSIS — N840 Polyp of corpus uteri: Secondary | ICD-10-CM | POA: Diagnosis not present

## 2016-08-23 DIAGNOSIS — F4323 Adjustment disorder with mixed anxiety and depressed mood: Secondary | ICD-10-CM | POA: Diagnosis not present

## 2016-08-26 DIAGNOSIS — L700 Acne vulgaris: Secondary | ICD-10-CM | POA: Diagnosis not present

## 2016-08-26 DIAGNOSIS — L738 Other specified follicular disorders: Secondary | ICD-10-CM | POA: Diagnosis not present

## 2016-09-02 DIAGNOSIS — F4323 Adjustment disorder with mixed anxiety and depressed mood: Secondary | ICD-10-CM | POA: Diagnosis not present

## 2016-09-09 ENCOUNTER — Telehealth: Payer: Self-pay | Admitting: *Deleted

## 2016-09-09 NOTE — Telephone Encounter (Signed)
"  I'm calling to cancel my surgery.  It's too much.  We just found out we will have to pay $700.00 off the bat.  It's just too much.  We can't do it because there's too many uncertainties.  My husband said no."  I will cancel the surgery.  [facility] and canceled patient's surgery.

## 2016-09-09 NOTE — Telephone Encounter (Signed)
I attempted to call patient and her husband.  I left them a message to call me back.

## 2016-09-09 NOTE — Telephone Encounter (Signed)
Chip BoerVicki transferred call from patient's husband regarding bunion surgery.  He requested a call back.

## 2016-09-09 NOTE — Telephone Encounter (Signed)
"  I'm calling for my wife, [name].  She's scheduled for bunionectomy on tomorrow.  My daughter has a pneumatic boot that is similar to the one my wife received.  I'm wondering if we could use that as a substitute.  I'd like a call back to talk about it.  Please give me a call back."  "I calling for my wife, [name].  I was just speaking with the billing assistant.  She said to leave a message for your.  My wife is scheduled for surgery tomorrow.  It looks like my insurance has kicked out the bunionectomy.  I need complete clarity about what's covered and what isn't.  I have a $450 bill here.  I need to make sure the surgery is cleared.  If the bunionectomy is not covered, we're going to have to cancel.  She is scheduled for surgery tomorrow so there is some pressure to get this done.  Give me a call."

## 2016-09-10 NOTE — Telephone Encounter (Signed)
I attempted to call [name].  Calling to apologize if I may have offended them in anyway.  Someone from the surgical center stated the patient was upset with me, stated I was rude and didn't return their calls.

## 2016-09-18 ENCOUNTER — Encounter: Payer: BLUE CROSS/BLUE SHIELD | Admitting: Podiatry

## 2016-09-19 DIAGNOSIS — F4323 Adjustment disorder with mixed anxiety and depressed mood: Secondary | ICD-10-CM | POA: Diagnosis not present

## 2016-10-02 DIAGNOSIS — F4323 Adjustment disorder with mixed anxiety and depressed mood: Secondary | ICD-10-CM | POA: Diagnosis not present

## 2016-10-21 DIAGNOSIS — F4323 Adjustment disorder with mixed anxiety and depressed mood: Secondary | ICD-10-CM | POA: Diagnosis not present

## 2016-11-04 DIAGNOSIS — F4323 Adjustment disorder with mixed anxiety and depressed mood: Secondary | ICD-10-CM | POA: Diagnosis not present

## 2016-11-11 DIAGNOSIS — J45990 Exercise induced bronchospasm: Secondary | ICD-10-CM | POA: Diagnosis not present

## 2016-11-11 DIAGNOSIS — J3089 Other allergic rhinitis: Secondary | ICD-10-CM | POA: Diagnosis not present

## 2016-11-11 DIAGNOSIS — J301 Allergic rhinitis due to pollen: Secondary | ICD-10-CM | POA: Diagnosis not present

## 2016-11-11 DIAGNOSIS — J3081 Allergic rhinitis due to animal (cat) (dog) hair and dander: Secondary | ICD-10-CM | POA: Diagnosis not present

## 2016-11-18 DIAGNOSIS — F4323 Adjustment disorder with mixed anxiety and depressed mood: Secondary | ICD-10-CM | POA: Diagnosis not present

## 2016-12-05 DIAGNOSIS — F4323 Adjustment disorder with mixed anxiety and depressed mood: Secondary | ICD-10-CM | POA: Diagnosis not present

## 2016-12-19 DIAGNOSIS — F4323 Adjustment disorder with mixed anxiety and depressed mood: Secondary | ICD-10-CM | POA: Diagnosis not present

## 2016-12-30 DIAGNOSIS — F4323 Adjustment disorder with mixed anxiety and depressed mood: Secondary | ICD-10-CM | POA: Diagnosis not present

## 2017-01-13 DIAGNOSIS — F4323 Adjustment disorder with mixed anxiety and depressed mood: Secondary | ICD-10-CM | POA: Diagnosis not present

## 2017-01-27 DIAGNOSIS — F4323 Adjustment disorder with mixed anxiety and depressed mood: Secondary | ICD-10-CM | POA: Diagnosis not present

## 2017-02-10 DIAGNOSIS — F4323 Adjustment disorder with mixed anxiety and depressed mood: Secondary | ICD-10-CM | POA: Diagnosis not present

## 2017-02-24 DIAGNOSIS — L738 Other specified follicular disorders: Secondary | ICD-10-CM | POA: Diagnosis not present

## 2017-02-24 DIAGNOSIS — L700 Acne vulgaris: Secondary | ICD-10-CM | POA: Diagnosis not present

## 2017-03-03 DIAGNOSIS — F4323 Adjustment disorder with mixed anxiety and depressed mood: Secondary | ICD-10-CM | POA: Diagnosis not present

## 2017-03-17 DIAGNOSIS — F4323 Adjustment disorder with mixed anxiety and depressed mood: Secondary | ICD-10-CM | POA: Diagnosis not present

## 2017-03-27 DIAGNOSIS — M25552 Pain in left hip: Secondary | ICD-10-CM | POA: Diagnosis not present

## 2017-03-27 DIAGNOSIS — M25562 Pain in left knee: Secondary | ICD-10-CM | POA: Diagnosis not present

## 2017-03-28 ENCOUNTER — Ambulatory Visit
Admission: RE | Admit: 2017-03-28 | Discharge: 2017-03-28 | Disposition: A | Discharge status: Home / Self Care | Payer: BLUE CROSS/BLUE SHIELD | Source: Ambulatory Visit | Attending: Family Medicine | Admitting: Family Medicine

## 2017-03-28 ENCOUNTER — Other Ambulatory Visit: Payer: Self-pay | Admitting: Family Medicine

## 2017-03-28 DIAGNOSIS — M25562 Pain in left knee: Secondary | ICD-10-CM

## 2017-04-08 DIAGNOSIS — F4323 Adjustment disorder with mixed anxiety and depressed mood: Secondary | ICD-10-CM | POA: Diagnosis not present

## 2017-04-25 DIAGNOSIS — F4323 Adjustment disorder with mixed anxiety and depressed mood: Secondary | ICD-10-CM | POA: Diagnosis not present

## 2017-04-28 DIAGNOSIS — M5137 Other intervertebral disc degeneration, lumbosacral region: Secondary | ICD-10-CM | POA: Diagnosis not present

## 2017-04-28 DIAGNOSIS — M545 Low back pain: Secondary | ICD-10-CM | POA: Diagnosis not present

## 2017-04-28 DIAGNOSIS — M779 Enthesopathy, unspecified: Secondary | ICD-10-CM | POA: Diagnosis not present

## 2017-05-05 DIAGNOSIS — F4323 Adjustment disorder with mixed anxiety and depressed mood: Secondary | ICD-10-CM | POA: Diagnosis not present

## 2017-05-20 DIAGNOSIS — F4323 Adjustment disorder with mixed anxiety and depressed mood: Secondary | ICD-10-CM | POA: Diagnosis not present

## 2017-07-09 DIAGNOSIS — M7661 Achilles tendinitis, right leg: Secondary | ICD-10-CM | POA: Diagnosis not present

## 2017-07-09 DIAGNOSIS — M21619 Bunion of unspecified foot: Secondary | ICD-10-CM | POA: Diagnosis not present

## 2017-07-09 DIAGNOSIS — M79672 Pain in left foot: Secondary | ICD-10-CM | POA: Diagnosis not present

## 2017-07-09 DIAGNOSIS — Q6622 Congenital metatarsus adductus: Secondary | ICD-10-CM | POA: Diagnosis not present

## 2017-07-09 DIAGNOSIS — M79671 Pain in right foot: Secondary | ICD-10-CM | POA: Diagnosis not present

## 2017-07-14 DIAGNOSIS — M7661 Achilles tendinitis, right leg: Secondary | ICD-10-CM | POA: Diagnosis not present

## 2017-07-14 DIAGNOSIS — M25552 Pain in left hip: Secondary | ICD-10-CM | POA: Diagnosis not present

## 2017-07-21 DIAGNOSIS — M7661 Achilles tendinitis, right leg: Secondary | ICD-10-CM | POA: Diagnosis not present

## 2017-07-21 DIAGNOSIS — M25552 Pain in left hip: Secondary | ICD-10-CM | POA: Diagnosis not present

## 2017-07-24 DIAGNOSIS — M25552 Pain in left hip: Secondary | ICD-10-CM | POA: Diagnosis not present

## 2017-07-24 DIAGNOSIS — M7661 Achilles tendinitis, right leg: Secondary | ICD-10-CM | POA: Diagnosis not present

## 2017-07-28 DIAGNOSIS — M7661 Achilles tendinitis, right leg: Secondary | ICD-10-CM | POA: Diagnosis not present

## 2017-07-28 DIAGNOSIS — M25552 Pain in left hip: Secondary | ICD-10-CM | POA: Diagnosis not present

## 2017-07-29 DIAGNOSIS — Z6822 Body mass index (BMI) 22.0-22.9, adult: Secondary | ICD-10-CM | POA: Diagnosis not present

## 2017-07-29 DIAGNOSIS — Z01419 Encounter for gynecological examination (general) (routine) without abnormal findings: Secondary | ICD-10-CM | POA: Diagnosis not present

## 2017-07-29 DIAGNOSIS — Z1231 Encounter for screening mammogram for malignant neoplasm of breast: Secondary | ICD-10-CM | POA: Diagnosis not present

## 2017-07-31 DIAGNOSIS — M7661 Achilles tendinitis, right leg: Secondary | ICD-10-CM | POA: Diagnosis not present

## 2017-07-31 DIAGNOSIS — M25552 Pain in left hip: Secondary | ICD-10-CM | POA: Diagnosis not present

## 2017-08-01 DIAGNOSIS — Z1329 Encounter for screening for other suspected endocrine disorder: Secondary | ICD-10-CM | POA: Diagnosis not present

## 2017-08-01 DIAGNOSIS — Z1321 Encounter for screening for nutritional disorder: Secondary | ICD-10-CM | POA: Diagnosis not present

## 2017-08-01 DIAGNOSIS — Z131 Encounter for screening for diabetes mellitus: Secondary | ICD-10-CM | POA: Diagnosis not present

## 2017-08-01 DIAGNOSIS — Z13220 Encounter for screening for lipoid disorders: Secondary | ICD-10-CM | POA: Diagnosis not present

## 2017-08-25 DIAGNOSIS — L700 Acne vulgaris: Secondary | ICD-10-CM | POA: Diagnosis not present

## 2017-08-25 DIAGNOSIS — L739 Follicular disorder, unspecified: Secondary | ICD-10-CM | POA: Diagnosis not present

## 2017-11-05 DIAGNOSIS — E559 Vitamin D deficiency, unspecified: Secondary | ICD-10-CM | POA: Diagnosis not present

## 2017-11-18 DIAGNOSIS — J3089 Other allergic rhinitis: Secondary | ICD-10-CM | POA: Diagnosis not present

## 2017-11-18 DIAGNOSIS — J45990 Exercise induced bronchospasm: Secondary | ICD-10-CM | POA: Diagnosis not present

## 2017-11-18 DIAGNOSIS — J301 Allergic rhinitis due to pollen: Secondary | ICD-10-CM | POA: Diagnosis not present

## 2017-11-18 DIAGNOSIS — J3081 Allergic rhinitis due to animal (cat) (dog) hair and dander: Secondary | ICD-10-CM | POA: Diagnosis not present

## 2018-01-20 DIAGNOSIS — N951 Menopausal and female climacteric states: Secondary | ICD-10-CM | POA: Diagnosis not present

## 2018-02-13 ENCOUNTER — Inpatient Hospital Stay (HOSPITAL_BASED_OUTPATIENT_CLINIC_OR_DEPARTMENT_OTHER)
Admission: EM | Admit: 2018-02-13 | Discharge: 2018-02-17 | DRG: 418 | Disposition: A | Discharge status: Home / Self Care | Payer: BLUE CROSS/BLUE SHIELD | Attending: Family Medicine | Admitting: Family Medicine

## 2018-02-13 ENCOUNTER — Encounter (HOSPITAL_BASED_OUTPATIENT_CLINIC_OR_DEPARTMENT_OTHER): Payer: Self-pay

## 2018-02-13 ENCOUNTER — Emergency Department (HOSPITAL_BASED_OUTPATIENT_CLINIC_OR_DEPARTMENT_OTHER): Payer: BLUE CROSS/BLUE SHIELD

## 2018-02-13 ENCOUNTER — Other Ambulatory Visit: Payer: Self-pay

## 2018-02-13 DIAGNOSIS — K8020 Calculus of gallbladder without cholecystitis without obstruction: Secondary | ICD-10-CM | POA: Diagnosis not present

## 2018-02-13 DIAGNOSIS — K859 Acute pancreatitis without necrosis or infection, unspecified: Secondary | ICD-10-CM | POA: Diagnosis not present

## 2018-02-13 DIAGNOSIS — E876 Hypokalemia: Secondary | ICD-10-CM | POA: Diagnosis present

## 2018-02-13 DIAGNOSIS — K589 Irritable bowel syndrome without diarrhea: Secondary | ICD-10-CM | POA: Diagnosis not present

## 2018-02-13 DIAGNOSIS — K851 Biliary acute pancreatitis without necrosis or infection: Principal | ICD-10-CM | POA: Insufficient documentation

## 2018-02-13 DIAGNOSIS — Z8719 Personal history of other diseases of the digestive system: Secondary | ICD-10-CM | POA: Diagnosis not present

## 2018-02-13 DIAGNOSIS — G8929 Other chronic pain: Secondary | ICD-10-CM | POA: Diagnosis present

## 2018-02-13 DIAGNOSIS — K8070 Calculus of gallbladder and bile duct without cholecystitis without obstruction: Secondary | ICD-10-CM | POA: Diagnosis not present

## 2018-02-13 DIAGNOSIS — K559 Vascular disorder of intestine, unspecified: Secondary | ICD-10-CM | POA: Diagnosis present

## 2018-02-13 DIAGNOSIS — R1110 Vomiting, unspecified: Secondary | ICD-10-CM | POA: Diagnosis not present

## 2018-02-13 DIAGNOSIS — M545 Low back pain: Secondary | ICD-10-CM | POA: Diagnosis not present

## 2018-02-13 DIAGNOSIS — J45909 Unspecified asthma, uncomplicated: Secondary | ICD-10-CM | POA: Diagnosis present

## 2018-02-13 DIAGNOSIS — Z79899 Other long term (current) drug therapy: Secondary | ICD-10-CM | POA: Diagnosis not present

## 2018-02-13 DIAGNOSIS — Z419 Encounter for procedure for purposes other than remedying health state, unspecified: Secondary | ICD-10-CM

## 2018-02-13 DIAGNOSIS — K8500 Idiopathic acute pancreatitis without necrosis or infection: Secondary | ICD-10-CM

## 2018-02-13 DIAGNOSIS — K219 Gastro-esophageal reflux disease without esophagitis: Secondary | ICD-10-CM | POA: Diagnosis not present

## 2018-02-13 DIAGNOSIS — K8010 Calculus of gallbladder with chronic cholecystitis without obstruction: Secondary | ICD-10-CM | POA: Diagnosis not present

## 2018-02-13 DIAGNOSIS — R52 Pain, unspecified: Secondary | ICD-10-CM

## 2018-02-13 LAB — CBC WITH DIFFERENTIAL/PLATELET
Abs Immature Granulocytes: 0.06 10*3/uL (ref 0.00–0.07)
Basophils Absolute: 0.1 10*3/uL (ref 0.0–0.1)
Basophils Relative: 0 %
Eosinophils Absolute: 0.1 10*3/uL (ref 0.0–0.5)
Eosinophils Relative: 0 %
HCT: 39.3 % (ref 36.0–46.0)
Hemoglobin: 12.9 g/dL (ref 12.0–15.0)
Immature Granulocytes: 0 %
Lymphocytes Relative: 6 %
Lymphs Abs: 1.1 10*3/uL (ref 0.7–4.0)
MCH: 28.7 pg (ref 26.0–34.0)
MCHC: 32.8 g/dL (ref 30.0–36.0)
MCV: 87.5 fL (ref 80.0–100.0)
Monocytes Absolute: 0.8 10*3/uL (ref 0.1–1.0)
Monocytes Relative: 4 %
Neutro Abs: 15.3 10*3/uL — ABNORMAL HIGH (ref 1.7–7.7)
Neutrophils Relative %: 90 %
Platelets: 309 10*3/uL (ref 150–400)
RBC: 4.49 MIL/uL (ref 3.87–5.11)
RDW: 13.7 % (ref 11.5–15.5)
WBC: 17.4 10*3/uL — ABNORMAL HIGH (ref 4.0–10.5)
nRBC: 0 % (ref 0.0–0.2)

## 2018-02-13 LAB — URINALYSIS, ROUTINE W REFLEX MICROSCOPIC
Bilirubin Urine: NEGATIVE
Glucose, UA: NEGATIVE mg/dL
Ketones, ur: 15 mg/dL — AB
Leukocytes, UA: NEGATIVE
Nitrite: NEGATIVE
Protein, ur: NEGATIVE mg/dL
Specific Gravity, Urine: 1.015 (ref 1.005–1.030)
pH: 6 (ref 5.0–8.0)

## 2018-02-13 LAB — URINALYSIS, MICROSCOPIC (REFLEX)

## 2018-02-13 LAB — COMPREHENSIVE METABOLIC PANEL
ALT: 16 U/L (ref 0–44)
AST: 19 U/L (ref 15–41)
Albumin: 4.1 g/dL (ref 3.5–5.0)
Alkaline Phosphatase: 43 U/L (ref 38–126)
Anion gap: 9 (ref 5–15)
BUN: 13 mg/dL (ref 6–20)
CO2: 24 mmol/L (ref 22–32)
Calcium: 8.5 mg/dL — ABNORMAL LOW (ref 8.9–10.3)
Chloride: 103 mmol/L (ref 98–111)
Creatinine, Ser: 0.71 mg/dL (ref 0.44–1.00)
GFR calc Af Amer: >60 mL/min (ref >60)
GFR calc non Af Amer: >60 mL/min (ref >60)
Glucose, Bld: 103 mg/dL — ABNORMAL HIGH (ref 70–99)
Potassium: 3.7 mmol/L (ref 3.5–5.1)
Sodium: 136 mmol/L (ref 135–145)
Total Bilirubin: 1.1 mg/dL (ref 0.3–1.2)
Total Protein: 7.2 g/dL (ref 6.5–8.1)

## 2018-02-13 LAB — LIPASE, BLOOD: Lipase: 109 U/L — ABNORMAL HIGH (ref 11–51)

## 2018-02-13 MED ORDER — MORPHINE SULFATE (PF) 4 MG/ML IV SOLN
4.0000 mg | Freq: Once | INTRAVENOUS | Status: AC
  Administered 2018-02-13: 4 mg via INTRAVENOUS
  Filled 2018-02-13: qty 1

## 2018-02-13 MED ORDER — ONDANSETRON HCL 4 MG PO TABS: 4.0000 mg | ORAL_TABLET | Freq: Four times a day (QID) | ORAL | Status: DC | PRN

## 2018-02-13 MED ORDER — SODIUM CHLORIDE 0.9 % IV BOLUS
500.0000 mL | Freq: Once | INTRAVENOUS | Status: AC
  Administered 2018-02-13: 500 mL via INTRAVENOUS

## 2018-02-13 MED ORDER — SODIUM CHLORIDE 0.9 % IV BOLUS (SEPSIS)
1000.0000 mL | Freq: Once | INTRAVENOUS | Status: AC
  Administered 2018-02-13: 1000 mL via INTRAVENOUS

## 2018-02-13 MED ORDER — METOCLOPRAMIDE HCL 5 MG/ML IJ SOLN
10.0000 mg | Freq: Once | INTRAMUSCULAR | Status: AC
  Administered 2018-02-13: 10 mg via INTRAVENOUS
  Filled 2018-02-13: qty 2

## 2018-02-13 MED ORDER — ONDANSETRON HCL 4 MG/2ML IJ SOLN
4.0000 mg | Freq: Four times a day (QID) | INTRAMUSCULAR | Status: DC | PRN
  Administered 2018-02-16: 4 mg via INTRAVENOUS

## 2018-02-13 MED ORDER — FAMOTIDINE IN NACL 20-0.9 MG/50ML-% IV SOLN
20.0000 mg | Freq: Once | INTRAVENOUS | Status: AC
  Administered 2018-02-13: 20 mg via INTRAVENOUS
  Filled 2018-02-13: qty 50

## 2018-02-13 MED ORDER — ACETAMINOPHEN 325 MG PO TABS
650.0000 mg | ORAL_TABLET | Freq: Four times a day (QID) | ORAL | Status: DC | PRN
  Administered 2018-02-15: 650 mg via ORAL
  Filled 2018-02-13: qty 2

## 2018-02-13 MED ORDER — LACTATED RINGERS IV SOLN
INTRAVENOUS | Status: AC
  Administered 2018-02-14 (×2): via INTRAVENOUS

## 2018-02-13 MED ORDER — ACETAMINOPHEN 650 MG RE SUPP: 650.0000 mg | Freq: Four times a day (QID) | RECTAL | Status: DC | PRN

## 2018-02-13 MED ORDER — IOPAMIDOL (ISOVUE-300) INJECTION 61%
100.0000 mL | Freq: Once | INTRAVENOUS | Status: AC | PRN
  Administered 2018-02-13: 100 mL via INTRAVENOUS

## 2018-02-13 MED ORDER — MORPHINE SULFATE (PF) 2 MG/ML IV SOLN
1.0000 mg | INTRAVENOUS | Status: DC | PRN
  Administered 2018-02-14 – 2018-02-15 (×4): 1 mg via INTRAVENOUS
  Filled 2018-02-13 (×4): qty 1

## 2018-02-13 MED ORDER — SODIUM CHLORIDE 0.9 % IV SOLN: 1000.0000 mL | INTRAVENOUS | Status: DC

## 2018-02-13 MED ORDER — ONDANSETRON HCL 4 MG/2ML IJ SOLN
4.0000 mg | Freq: Once | INTRAMUSCULAR | Status: AC
  Administered 2018-02-13: 4 mg via INTRAVENOUS
  Filled 2018-02-13: qty 2

## 2018-02-13 NOTE — H&P (Addendum)
History and Physical    [name] ZOX:[id number] DOB: 04/02/1971 DOA: 02/13/2018  PCP: Mila Palmer, MD  Patient coming from: Home.  Chief Complaint: Abdominal pain.  HPI: [name] is a 47 y.o. female with history of chronic back pain and previous history of ischemic colitis in 2009 at the time patient also had some jejunal intussusception presents to the ER admits in [facility] with complaints of acute worsening of abdominal pain since this morning.  Denies any vomiting prior to coming to the ER.  Denies any diarrhea.  The night before patient had some flank pain which patient thought was her usual low back pain.  Denies any fever chills or any recent travel or sick contacts.  Does not drink alcohol.  ED Course: In the ER patient's lipase was mildly elevated with CT scan showing possible pancreatitis versus duodenitis and also showing gallstones.  Patient was started on fluids and admitted for further management.  On my exam patient has significant tenderness in the epigastric area.  LFTs are normal.  Review of Systems: As per HPI, rest all negative.   Past Medical History:  Diagnosis Date  . Allergic rhinitis   . Asthma   . Gallstones 1/09   on CT  . IBS (irritable bowel syndrome)   . Ischemic colitis (HCC) 1/09  . Jejunal intussusception (HCC) 1/09   on CT    Past Surgical History:  Procedure Laterality Date  . COLONOSCOPY  1/09     reports that she has never smoked. She has never used smokeless tobacco. She reports that she drinks alcohol. She reports that she does not use drugs.  No Known Allergies  Family History  Problem Relation Age of Onset  . Diabetes Mellitus II Neg Hx   . Hypertension Neg Hx     Prior to Admission medications   Medication Sig Start Date End Date Taking? Authorizing Provider  calcium-vitamin D (OSCAL WITH D) 500-200 MG-UNIT tablet Take 2 tablets by mouth daily with breakfast.   Yes [provider]  cholecalciferol  (VITAMIN D) 1000 units tablet Take 1,000 Units by mouth daily.   Yes [provider]  cyclobenzaprine (FLEXERIL) 10 MG tablet Take 10 mg by mouth 3 (three) times daily as needed for muscle spasms.  04/01/13  Yes [provider]  fexofenadine (ALLEGRA) 180 MG tablet Take 180 mg by mouth daily.   Yes [provider]  GLUCOSAMINE-CHONDROITIN PO Take 1 tablet by mouth daily.   Yes [provider]  montelukast (SINGULAIR) 10 MG tablet Take 10 mg by mouth daily.  06/26/13  Yes [provider]  Multiple Vitamin (MULTIVITAMIN WITH MINERALS) TABS tablet Take 1 tablet by mouth daily.   Yes [provider]  Omega-3 Fatty Acids (EQL OMEGA 3 FISH OIL) 1400 MG CAPS Take 1,400 mg by mouth daily.   Yes [provider]  Probiotic Product (ALIGN) 4 MG CAPS Take 1 capsule by mouth daily.   Yes [provider]  tretinoin (RETIN-A) 0.025 % cream Apply 1 application topically 2 (two) times a week.  05/30/16  Yes [provider]  VENTOLIN HFA 108 (90 BASE) MCG/ACT inhaler Inhale 2 puffs into the lungs every 4 (four) hours as needed for wheezing or shortness of breath. Before exercise 04/24/13  Yes [provider]  ACZONE 7.5 % GEL  05/29/16   [provider]  cetirizine (ZYRTEC) 10 MG tablet Take 10 mg by mouth daily.    [provider]  diclofenac (  VOLTAREN) 75 MG EC tablet Take 75 mg by mouth 2 (two) times daily as needed for mild pain.     [provider]    Physical Exam: Vitals:   02/13/18 1707 02/13/18 1926 02/13/18 2148 02/13/18 2259  BP:  108/69 118/74 130/79  Pulse:  71 73 67  Resp:  16 16 15   Temp:  98.3 F (36.8 C) 98.7 F (37.1 C) 99.1 F (37.3 C)  TempSrc:  Oral Oral Oral  SpO2:  100% 98% 100%  Weight: 63.5 kg     Height: 5\' 5"  (1.651 m)         Constitutional: Moderately built and nourished. Vitals:   02/13/18 1707 02/13/18 1926 02/13/18 2148 02/13/18 2259  BP:  108/69 118/74 130/79   Pulse:  71 73 67  Resp:  16 16 15   Temp:  98.3 F (36.8 C) 98.7 F (37.1 C) 99.1 F (37.3 C)  TempSrc:  Oral Oral Oral  SpO2:  100% 98% 100%  Weight: 63.5 kg     Height: 5\' 5"  (1.651 m)      Eyes: Anicteric no pallor. ENMT: No discharge from the ears eyes nose or mouth. Neck: No mass felt.  No neck rigidity.  No JVD appreciated. Respiratory: No rhonchi or crepitations. Cardiovascular: S1-S2 heard no murmurs appreciated. Abdomen: Soft epigastric tenderness present.  No guarding or rigidity. Musculoskeletal: No edema.   Skin: No rash. Neurologic: Alert awake oriented to time place and person.  Moves all extremities. Psychiatric: Appears normal.   Labs on Admission: I have personally reviewed following labs and imaging studies  CBC: Recent Labs  Lab 02/13/18 1801  WBC 17.4*  NEUTROABS 15.3*  HGB 12.9  HCT 39.3  MCV 87.5  PLT 309   Basic Metabolic Panel: Recent Labs  Lab 02/13/18 1801  NA 136  K 3.7  CL 103  CO2 24  GLUCOSE 103*  BUN 13  CREATININE 0.71  CALCIUM 8.5*   GFR: Estimated Creatinine Clearance: 79.9 mL/min (by C-G formula based on SCr of 0.71 mg/dL). Liver Function Tests: Recent Labs  Lab 02/13/18 1801  AST 19  ALT 16  ALKPHOS 43  BILITOT 1.1  PROT 7.2  ALBUMIN 4.1   Recent Labs  Lab 02/13/18 1801  LIPASE 109*   No results for input(s): AMMONIA in the last 168 hours. Coagulation Profile: No results for input(s): INR, PROTIME in the last 168 hours. Cardiac Enzymes: No results for input(s): CKTOTAL, CKMB, CKMBINDEX, TROPONINI in the last 168 hours. BNP (last 3 results) No results for input(s): PROBNP in the last 8760 hours. HbA1C: No results for input(s): HGBA1C in the last 72 hours. CBG: No results for input(s): GLUCAP in the last 168 hours. Lipid Profile: No results for input(s): CHOL, HDL, LDLCALC, TRIG, CHOLHDL, LDLDIRECT in the last 72 hours. Thyroid Function Tests: No results for input(s): TSH, T4TOTAL, FREET4, T3FREE,  THYROIDAB in the last 72 hours. Anemia Panel: No results for input(s): VITAMINB12, FOLATE, FERRITIN, TIBC, IRON, RETICCTPCT in the last 72 hours. Urine analysis:    Component Value Date/Time   COLORURINE YELLOW 02/13/2018 1715   APPEARANCEUR CLEAR 02/13/2018 1715   LABSPEC 1.015 02/13/2018 1715   PHURINE 6.0 02/13/2018 1715   GLUCOSEU NEGATIVE 02/13/2018 1715   HGBUR TRACE (A) 02/13/2018 1715   BILIRUBINUR NEGATIVE 02/13/2018 1715   KETONESUR 15 (A) 02/13/2018 1715   PROTEINUR NEGATIVE 02/13/2018 1715   UROBILINOGEN 0.2 04/25/2007 1418   NITRITE NEGATIVE 02/13/2018 1715   LEUKOCYTESUR NEGATIVE 02/13/2018 1715  Sepsis Labs: @LABRCNTIP (procalcitonin:4,lacticidven:4) )No results found for this or any previous visit (from the past 240 hour(s)).   Radiological Exams on Admission: Ct Abdomen Pelvis W Contrast  Result Date: 02/13/2018 CLINICAL DATA:  Nausea vomiting and abdominal pain EXAM: CT ABDOMEN AND PELVIS WITH CONTRAST TECHNIQUE: Multidetector CT imaging of the abdomen and pelvis was performed using the standard protocol following bolus administration of intravenous contrast. CONTRAST:  ISOVUE-300 IOPAMIDOL (ISOVUE-300) INJECTION 61% COMPARISON:  CT 04/25/2007 FINDINGS: Lower chest: Lung bases demonstrate no acute consolidation or effusion. The heart size is normal Hepatobiliary: No focal liver abnormality is seen. Multiple calcified stones in the gallbladder. No biliary dilatation. Pancreas: Head, uncinate process and proximal body appear indistinct and enlarged. Moderate peripancreatic edema. No organized fluid collections. Normal enhancement of the pancreas. Spleen: Normal in size without focal abnormality. Adrenals/Urinary Tract: Adrenal glands are unremarkable. Kidneys are normal, without renal calculi, focal lesion, or hydronephrosis. Bladder is unremarkable. Stomach/Bowel: Stomach is within normal limits. Wall thickening of the duodenum and proximal jejunal loops. No dilated  small bowel. No colon wall thickening. Negative appendix. Sigmoid colon diverticular disease. Collapsed appearance of the sigmoid colon. Vascular/Lymphatic: Normal enhancement of the portal vessels and splenic vein. Nonaneurysmal aorta. No significant adenopathy Reproductive: Multiple hypodensities at the cervix may reflect nabothian cysts. Tampon in the vagina. Cyst in the right ovary measuring 2 cm. Other: Small free fluid in the pelvis.  No free air. Musculoskeletal: Degenerative changes at L5-S1. No acute or suspicious abnormality. IMPRESSION: 1. Enlarged indistinct appearance of the head, uncinate process and proximal body of the pancreas with surrounding fluid and moderate edema suspect for an acute pancreatitis. No organized fluid collection. No necrosis. 2. Thickened appearance of the duodenum and proximal jejunal bowel loops, either due to reactive inflammation from the pancreas inflammation or duodenitis/enteritis 3. Gallstones 4. Sigmoid colon diverticular disease without acute inflammation 5. Small free fluid in the pelvis 6. Multiple low-density lesions in the cervix likely representing nabothian cysts, suggest nonemergent pelvic ultrasound correlation Electronically Signed   By: Jasmine Pang M.D.   On: 02/13/2018 20:26     Assessment/Plan Principal Problem:   Acute pancreatitis without infection or necrosis Active Problems:   Gallstones   Acute pancreatitis    1. Acute abdominal pain likely from possible pancreatitis versus duodenitis -we will keep patient n.p.o. and on aggressive IV fluid hydration.  Pepcid.  Repeat labs in the morning including LFTs.  Check lactate levels given previous history of ischemic bowel.  I have ordered MRCP to make sure there is no choledocholithiasis given the gallstones.  Pain relief medications. 2. Gallstones -check MRCP to make sure there is no choledocholithiasis.  If MRCP does show pancreatitis patient eventually may need cholecystectomy.  No definite  signs of cholecystitis at this time. 3. Previous history of ischemic colitis per colonoscopy done in 2009.  Follow lactate levels. 4. Chronic low back pain. 5. Abnormal lesions in the cervix seen in the CAT scan will need further work-up.   DVT prophylaxis: SCDs until we get MRCP results. Code Status: Full code. Family Communication: Patient husband. Disposition Plan: Home. Consults called: None. Admission status: Inpatient.   Eduard Clos MD [facility] Pager [phone].  If 7PM-7AM, please contact night-coverage [email or web address] Password [facility]  02/13/2018, 11:42 PM

## 2018-02-13 NOTE — ED Notes (Signed)
Report to [facility] 

## 2018-02-13 NOTE — ED Provider Notes (Signed)
[facility] EMERGENCY DEPARTMENT Provider Note   CSN: [id number] Arrival date & time: 02/13/18  1653     History   Chief Complaint Chief Complaint  Patient presents with  . Abdominal Pain    HPI [name] is a 47 y.o. female presenting for evaluation of epigastric abdominal pain.  Patient states she developed acute onset epigastric pain when she woke up this morning.  Pain radiates to her back.  She has associated nausea, threw up 2 times in the ER.  She denies fevers, chills, chest pain, shortness of breath, urinary symptoms, abnormal bowel movements.  She denies history of similar.  She took Pepto-Bismol which did not improve her symptoms.  She has not tried anything else.  She has not anything to eat or drink today.  Pain is constant, nothing makes it better.  Nothing makes it worse.  Patient states she feels very bloated.  This began last night.  Patient states she has had no abdominal surgeries, although has been seen by GI in the past for IBS, ischemic colitis, and jejunal intussusception. She saw Dr. Leone Payor with GI, has been several years since last apt. she takes probiotics daily, takes no other medications daily.  Denies sick contacts.  No recent travel.  Patient denies alcohol use.  Denies tobacco or drug use.  HPI  Past Medical History:  Diagnosis Date  . Allergic rhinitis   . Asthma   . Gallstones 1/09   on CT  . IBS (irritable bowel syndrome)   . Ischemic colitis (HCC) 1/09  . Jejunal intussusception (HCC) 1/09   on CT    Patient Active Problem List   Diagnosis Date Noted  . Lactose intolerance 07/10/2013  . Irritable bowel syndrome 06/25/2007  . ALLERGIC RHINITIS CAUSE UNSPECIFIED 05/13/2007  . Intussusception (HCC) 05/13/2007  . CALCU GALLBLADD W/ACUT CHOLCYST W/O MENTION OBST 05/13/2007  . FLATULENCE ERUCTATION AND GAS PAIN 05/13/2007    Past Surgical History:  Procedure Laterality Date  . COLONOSCOPY  1/09     OB History   None      Home Medications    Prior to Admission medications   Medication Sig Start Date End Date Taking? Authorizing Provider  ACZONE 7.5 % GEL  05/29/16   [provider]  cetirizine (ZYRTEC) 10 MG tablet Take 10 mg by mouth daily.    [provider]  cyclobenzaprine (FLEXERIL) 10 MG tablet Take 10 mg by mouth as needed.  04/01/13   [provider]  diclofenac (VOLTAREN) 75 MG EC tablet Take 75 mg by mouth 2 (two) times daily.    [provider]  Glucosamine HCl (GLUCOSAMINE PO) Take 1 tablet by mouth 2 (two) times daily.    [provider]  montelukast (SINGULAIR) 10 MG tablet Take 10 mg by mouth daily.  06/26/13   [provider]  Multiple Vitamin (MULTIVITAMIN) tablet Take 1 tablet by mouth daily.    [provider]  Probiotic Product (ALIGN) 4 MG CAPS Take 1 capsule by mouth daily.    [provider]  tretinoin (RETIN-A) 0.025 % cream  05/30/16   [provider]  VENTOLIN HFA 108 (90 BASE) MCG/ACT inhaler Inhale 2 puffs into the lungs. Before exercise 04/24/13   [provider]    Family History No family history on file.  Social History Social History   Tobacco Use  . Smoking status: Never Smoker  . Smokeless tobacco: Never Used  Substance Use Topics  . Alcohol use: Yes  Comment: occasional  . Drug use: No     Allergies   Patient has no known allergies.   Review of Systems Review of Systems  Gastrointestinal: Positive for abdominal pain, nausea and vomiting.  All other systems reviewed and are negative.    Physical Exam Updated Vital Signs BP 108/69 (BP Location: Right Arm)   Pulse 71   Temp 98.3 F (36.8 C) (Oral)   Resp 16   Ht 5\' 5"  (1.651 m)   Wt 63.5 kg   LMP 02/08/2018   SpO2 100%   BMI 23.30 kg/m   Physical Exam  Constitutional: She is oriented to person, place, and time. She appears well-developed and well-nourished.  Appears uncomfortable due to pain  HENT:   Head: Normocephalic and atraumatic.  Eyes: Pupils are equal, round, and reactive to light. Conjunctivae and EOM are normal.  Neck: Normal range of motion. Neck supple.  Cardiovascular: Normal rate, regular rhythm and intact distal pulses.  Pulmonary/Chest: Effort normal and breath sounds normal. No respiratory distress. She has no wheezes.  Abdominal: Soft. She exhibits no distension and no mass. There is tenderness in the right upper quadrant, epigastric area and left upper quadrant. There is no rigidity, no rebound, no guarding and negative Murphy's sign.  Tenderness to palpation of epigastric, right upper quadrant, and left upper quadrant abdomen.  No tenderness palpation of lower abdomen.  Soft without rigidity, guarding, distention.  Negative rebound.  Musculoskeletal: Normal range of motion.  Neurological: She is alert and oriented to person, place, and time.  Skin: Skin is warm and dry. Capillary refill takes less than 2 seconds.  Psychiatric: She has a normal mood and affect.  Nursing note and vitals reviewed.    ED Treatments / Results  Labs (all labs ordered are listed, but only abnormal results are displayed) Labs Reviewed  CBC WITH DIFFERENTIAL/PLATELET - Abnormal; Notable for the following components:      Result Value   WBC 17.4 (*)    Neutro Abs 15.3 (*)    All other components within normal limits  COMPREHENSIVE METABOLIC PANEL - Abnormal; Notable for the following components:   Glucose, Bld 103 (*)    Calcium 8.5 (*)    All other components within normal limits  LIPASE, BLOOD - Abnormal; Notable for the following components:   Lipase 109 (*)    All other components within normal limits  URINALYSIS, ROUTINE W REFLEX MICROSCOPIC - Abnormal; Notable for the following components:   Hgb urine dipstick TRACE (*)    Ketones, ur 15 (*)    All other components within normal limits  URINALYSIS, MICROSCOPIC (REFLEX) - Abnormal; Notable for the following components:    Bacteria, UA RARE (*)    All other components within normal limits  URINE CULTURE    EKG None  Radiology Ct Abdomen Pelvis W Contrast  Result Date: 02/13/2018 CLINICAL DATA:  Nausea vomiting and abdominal pain EXAM: CT ABDOMEN AND PELVIS WITH CONTRAST TECHNIQUE: Multidetector CT imaging of the abdomen and pelvis was performed using the standard protocol following bolus administration of intravenous contrast. CONTRAST:  ISOVUE-300 IOPAMIDOL (ISOVUE-300) INJECTION 61% COMPARISON:  CT 04/25/2007 FINDINGS: Lower chest: Lung bases demonstrate no acute consolidation or effusion. The heart size is normal Hepatobiliary: No focal liver abnormality is seen. Multiple calcified stones in the gallbladder. No biliary dilatation. Pancreas: Head, uncinate process and proximal body appear indistinct and enlarged. Moderate peripancreatic edema. No organized fluid collections. Normal enhancement of the pancreas. Spleen: Normal in size without  focal abnormality. Adrenals/Urinary Tract: Adrenal glands are unremarkable. Kidneys are normal, without renal calculi, focal lesion, or hydronephrosis. Bladder is unremarkable. Stomach/Bowel: Stomach is within normal limits. Wall thickening of the duodenum and proximal jejunal loops. No dilated small bowel. No colon wall thickening. Negative appendix. Sigmoid colon diverticular disease. Collapsed appearance of the sigmoid colon. Vascular/Lymphatic: Normal enhancement of the portal vessels and splenic vein. Nonaneurysmal aorta. No significant adenopathy Reproductive: Multiple hypodensities at the cervix may reflect nabothian cysts. Tampon in the vagina. Cyst in the right ovary measuring 2 cm. Other: Small free fluid in the pelvis.  No free air. Musculoskeletal: Degenerative changes at L5-S1. No acute or suspicious abnormality. IMPRESSION: 1. Enlarged indistinct appearance of the head, uncinate process and proximal body of the pancreas with surrounding fluid and moderate edema  suspect for an acute pancreatitis. No organized fluid collection. No necrosis. 2. Thickened appearance of the duodenum and proximal jejunal bowel loops, either due to reactive inflammation from the pancreas inflammation or duodenitis/enteritis 3. Gallstones 4. Sigmoid colon diverticular disease without acute inflammation 5. Small free fluid in the pelvis 6. Multiple low-density lesions in the cervix likely representing nabothian cysts, suggest nonemergent pelvic ultrasound correlation Electronically Signed   By: Jasmine Pang M.D.   On: 02/13/2018 20:26    Procedures Procedures (including critical care time)  Medications Ordered in ED Medications  morphine 4 MG/ML injection 4 mg (has no administration in time range)  ondansetron (ZOFRAN) injection 4 mg (has no administration in time range)  sodium chloride 0.9 % bolus 1,000 mL (has no administration in time range)    Followed by  0.9 %  sodium chloride infusion (has no administration in time range)  famotidine (PEPCID) IVPB 20 mg premix (has no administration in time range)  sodium chloride 0.9 % bolus 500 mL (0 mLs Intravenous Stopped 02/13/18 1923)  morphine 4 MG/ML injection 4 mg (4 mg Intravenous Given 02/13/18 1811)  metoCLOPramide (REGLAN) injection 10 mg (10 mg Intravenous Given 02/13/18 1810)  iopamidol (ISOVUE-300) 61 % injection 100 mL (100 mLs Intravenous Contrast Given 02/13/18 2001)     Initial Impression / Assessment and Plan / ED Course  I have reviewed the triage vital signs and the nursing notes.  Pertinent labs & imaging results that were available during my care of the patient were reviewed by me and considered in my medical decision making (see chart for details).     Pt presenting for evaluation of epigastric abdominal pain, nausea, vomiting.  Physical exam shows patient who appears uncomfortable, but in no acute distress.  She is afebrile not tachycardic.  Tenderness palpation of epigastric abdomen.  Significant abd  history including ischemic colitis, intussusception, and IBS.  Will obtain abdominal labs, urine, and CT abdomen pelvis for further evaluation. Morphine and reglan given for pain control.   Labs show elevated lipase 109, and leukocytosis at 17.4.  Otherwise reassuring.  Kidney and liver function reassuring.  Bili normal at 1.1.  Urine without infection.  CT pending.  CT assistant with pancreatitis.  Additionally, shows gallstones without cholecystitis.  As patient denies alcohol use, and has no risk factors for pancreatitis or previous episodes of pancreatitis, consider possible gallstone as cause.  Case discussed with attending, Dr. Donnald Garre evaluated the patient.  Patient has recurrence of pain and nausea, repeat symptom medic control.  Findings discussed with patient and husband.  Patient may have gallstone pancreatitis, likely needs to be admitted for further management.  Will call for admission.  Discussed with Dr. Toniann Fail  from [facility] service, patient to be [facility] via [facility] and admitted at [place] [facility].  Final Clinical Impressions(s) / ED Diagnoses   Final diagnoses:  Acute pancreatitis without infection or necrosis, unspecified pancreatitis type    ED Discharge Orders    None       Alveria Apley, PA-C 02/13/18 2331    Arby Barrette, MD 02/21/18 1457

## 2018-02-13 NOTE — ED Provider Notes (Signed)
Medical screening examination/treatment/procedure(s) were conducted as a shared visit with non-physician practitioner(s) and myself.  I personally evaluated the patient during the encounter.  None Symptoms started today with onset of epigastric pain.  Aching in quality.  Pain has worsened over the course of the day.  After arriving to the emergency department she started to have vomiting of thin yellow material x2.  Denies alcohol use.  No history of pancreatitis.  She is alert and nontoxic.  She is nauseated with episode vomiting.  No respiratory distress.  Lungs clear.  Heart regular.  Abdomen with epigastric tenderness to palpation.  Lower extremities normal without peripheral edema.  Skin warm and dry.  Patient has pancreatitis without known risk factors.  She does have multiple stones in the gallbladder without evident biliary dilation.  Seems most likely source.  Agree with plan and management.   Arby Barrette, MD 02/13/18 2110

## 2018-02-13 NOTE — ED Notes (Signed)
Due to pt's h/o colitis, oral contrast given to pt at 6 pm to be scanned at approx 8pm

## 2018-02-13 NOTE — ED Triage Notes (Signed)
C/o abd pain lower back pain x today-denies n/v/d-NAD-steady gait

## 2018-02-13 NOTE — ED Notes (Signed)
Pt transferred to [facility] via [facility]. 

## 2018-02-14 ENCOUNTER — Inpatient Hospital Stay (HOSPITAL_COMMUNITY): Payer: BLUE CROSS/BLUE SHIELD

## 2018-02-14 ENCOUNTER — Encounter (HOSPITAL_COMMUNITY): Payer: Self-pay | Admitting: Surgery

## 2018-02-14 DIAGNOSIS — K859 Acute pancreatitis without necrosis or infection, unspecified: Secondary | ICD-10-CM

## 2018-02-14 LAB — CBC WITH DIFFERENTIAL/PLATELET
Abs Immature Granulocytes: 0.09 10*3/uL — ABNORMAL HIGH (ref 0.00–0.07)
Basophils Absolute: 0.1 10*3/uL (ref 0.0–0.1)
Basophils Relative: 0 %
Eosinophils Absolute: 0 10*3/uL (ref 0.0–0.5)
Eosinophils Relative: 0 %
HCT: 34.3 % — ABNORMAL LOW (ref 36.0–46.0)
Hemoglobin: 11.1 g/dL — ABNORMAL LOW (ref 12.0–15.0)
Immature Granulocytes: 1 %
Lymphocytes Relative: 5 %
Lymphs Abs: 0.9 10*3/uL (ref 0.7–4.0)
MCH: 28.7 pg (ref 26.0–34.0)
MCHC: 32.4 g/dL (ref 30.0–36.0)
MCV: 88.6 fL (ref 80.0–100.0)
Monocytes Absolute: 0.9 10*3/uL (ref 0.1–1.0)
Monocytes Relative: 5 %
Neutro Abs: 17.7 10*3/uL — ABNORMAL HIGH (ref 1.7–7.7)
Neutrophils Relative %: 89 %
Platelets: 270 10*3/uL (ref 150–400)
RBC: 3.87 MIL/uL (ref 3.87–5.11)
RDW: 13.7 % (ref 11.5–15.5)
WBC: 19.7 10*3/uL — ABNORMAL HIGH (ref 4.0–10.5)
nRBC: 0 % (ref 0.0–0.2)

## 2018-02-14 LAB — BASIC METABOLIC PANEL
Anion gap: 8 (ref 5–15)
BUN: 8 mg/dL (ref 6–20)
CO2: 23 mmol/L (ref 22–32)
Calcium: 7.2 mg/dL — ABNORMAL LOW (ref 8.9–10.3)
Chloride: 106 mmol/L (ref 98–111)
Creatinine, Ser: 0.64 mg/dL (ref 0.44–1.00)
GFR calc Af Amer: >60 mL/min (ref >60)
GFR calc non Af Amer: >60 mL/min (ref >60)
Glucose, Bld: 127 mg/dL — ABNORMAL HIGH (ref 70–99)
Potassium: 3.6 mmol/L (ref 3.5–5.1)
Sodium: 137 mmol/L (ref 135–145)

## 2018-02-14 LAB — LACTIC ACID, PLASMA: Lactic Acid, Venous: 0.8 mmol/L (ref 0.5–1.9)

## 2018-02-14 LAB — C-REACTIVE PROTEIN: CRP: 7 mg/dL — ABNORMAL HIGH (ref <1.0)

## 2018-02-14 LAB — HEPATIC FUNCTION PANEL
ALT: 12 U/L (ref 0–44)
AST: 15 U/L (ref 15–41)
Albumin: 3.1 g/dL — ABNORMAL LOW (ref 3.5–5.0)
Alkaline Phosphatase: 37 U/L — ABNORMAL LOW (ref 38–126)
Bilirubin, Direct: 0.3 mg/dL — ABNORMAL HIGH (ref 0.0–0.2)
Indirect Bilirubin: 1.3 mg/dL — ABNORMAL HIGH (ref 0.3–0.9)
Total Bilirubin: 1.6 mg/dL — ABNORMAL HIGH (ref 0.3–1.2)
Total Protein: 5.8 g/dL — ABNORMAL LOW (ref 6.5–8.1)

## 2018-02-14 LAB — HIV ANTIBODY (ROUTINE TESTING W REFLEX): HIV Screen 4th Generation wRfx: NONREACTIVE

## 2018-02-14 LAB — TRIGLYCERIDES: Triglycerides: 20 mg/dL (ref <150)

## 2018-02-14 MED ORDER — FAMOTIDINE IN NACL 20-0.9 MG/50ML-% IV SOLN
20.0000 mg | Freq: Two times a day (BID) | INTRAVENOUS | Status: DC
  Administered 2018-02-14 – 2018-02-15 (×4): 20 mg via INTRAVENOUS
  Filled 2018-02-14 (×4): qty 50

## 2018-02-14 MED ORDER — LORAZEPAM 2 MG/ML IJ SOLN
1.0000 mg | Freq: Once | INTRAMUSCULAR | Status: AC
  Administered 2018-02-14: 2 mg via INTRAVENOUS
  Filled 2018-02-14: qty 1

## 2018-02-14 NOTE — Progress Notes (Signed)
[facility] PROGRESS NOTE  [name] ZOX:[id number] DOB: [birth year] DOA: 02/13/2018 PCP: Darlene Palmer, MD   Narrative: 14 fem with IBS, well controlled asthma, Acne and ? Intussusception in past Admit with Pancreatitis of unknow etiology Ct showed Duodenitis vs pancreatitis MRI confirms acute pancreatitis with small layering GB stone   A & Plan Pancreatitis-bowel rest-saline-soft diet--usually  need interval Cholecystectomy once pancreatitis resolves--get lipase in am and will d/c gen surgery at some point during this admit  IBS-improved-sees Dr. Madaline Hoover, full code, ip, d/w husband bedside    Darlene Menghini, MD  [facility] Direct contact: [phone] --Via amion app OR  --[email or web address]; password [email or web address]  7PM-7AM contact night coverage as above 02/14/2018, 10:46 AM  LOS: 1 day   Consultants:  Gen surg  Procedures:  MRI  CT scan  Antimicrobials:  none  Interval history/Subjective:  Tired needed ativan for MRI No distress No n now tol sips Pain is controlled in 4/10 range No fever  Objective:  Vitals:  Vitals:   02/13/18 2259 02/14/18 0616  BP: 130/79 116/71  Pulse: 67 88  Resp: 15 14  Temp: 99.1 F (37.3 C) 99.1 F (37.3 C)  SpO2: 100% 98%    Exam:  Awake alert pleasant in nad but tired eomi ncat ctab abd soft slight tende rin epigastrium No le edema Neuro is intact moving 4 limbs  I have personally reviewed the following:   Labs:  Bili up from 0.8-->1.6  TG 20  Lactic acid 0.8, PCT 7.0  Imaging studies:  mri  Medical tests:  n   Test discussed with performing physician:  n  Decision to obtain old records:  n  Review and summation of old records:  n  Scheduled Meds: Continuous Infusions: . famotidine (PEPCID) IV 20 mg (02/14/18 0927)  . lactated ringers 125 mL/hr at 02/14/18 5621    Principal Problem:   Acute pancreatitis without infection or necrosis Active Problems:   Gallstones  Acute pancreatitis   LOS: 1 day

## 2018-02-14 NOTE — Consult Note (Signed)
General Surgery [facility]  Reason for Consult: biliary pancreatitis, abd pain  Referring Physician: Dr. Verlon Au, [facility]  [name] is an 47 y.o. female.  HPI: patient is a 47 yo female admitted to the medical service with biliary pancreatitis.  Patient developed epigastric abd pain radiating to the back yesterday morning.  Pain became more severe and they presented to [place] for evaluation.  CT scan demonstrated gallstones.  LFT's were elevated.  Lipase level was mildly elevated.  Subsequent MRCP demonstrated no ductal obstruction but did show evidence of pancreatitis.  General surgery now consulted for possible cholecystectomy.  No history of significant alcohol use.  No prior abdominal surgery.  Two children.  Husband at bedside.  Past Medical History:  Diagnosis Date  . Allergic rhinitis   . Asthma   . Gallstones 1/09   on CT  . IBS (irritable bowel syndrome)   . Ischemic colitis ([place]) 1/09  . Jejunal intussusception ([place]) 1/09   on CT    Past Surgical History:  Procedure Laterality Date  . COLONOSCOPY  1/09    Family History  Problem Relation Age of Onset  . Diabetes Mellitus II Neg Hx   . Hypertension Neg Hx     Social History:  reports that she has never smoked. She has never used smokeless tobacco. She reports that she drinks alcohol. She reports that she does not use drugs.  Allergies: No Known Allergies  Medications: I have reviewed the patient's current medications.  Results for orders placed or performed during the hospital encounter of 02/13/18 (from the past 48 hour(s))  Urinalysis, Routine w reflex microscopic     Status: Abnormal   Collection Time: 02/13/18  5:15 PM  Result Value Ref Range   Color, Urine YELLOW YELLOW   APPearance CLEAR CLEAR   Specific Gravity, Urine 1.015 1.005 - 1.030   pH 6.0 5.0 - 8.0   Glucose, UA NEGATIVE NEGATIVE mg/dL   Hgb urine dipstick TRACE (A) NEGATIVE   Bilirubin Urine  NEGATIVE NEGATIVE   Ketones, ur 15 (A) NEGATIVE mg/dL   Protein, ur NEGATIVE NEGATIVE mg/dL   Nitrite NEGATIVE NEGATIVE   Leukocytes, UA NEGATIVE NEGATIVE    Comment: Performed at [facility], [address] [place], [place] [address]  Urinalysis, Microscopic (reflex)     Status: Abnormal   Collection Time: 02/13/18  5:15 PM  Result Value Ref Range   RBC / HPF 0-5 0 - 5 RBC/hpf   WBC, UA 0-5 0 - 5 WBC/hpf   Bacteria, UA RARE (A) NONE SEEN   Squamous Epithelial / LPF 0-5 0 - 5   Mucus PRESENT     Comment: Performed at [facility], [address] [place], [place] [address]  CBC with Differential     Status: Abnormal   Collection Time: 02/13/18  6:01 PM  Result Value Ref Range   WBC 17.4 (H) 4.0 - 10.5 K/uL   RBC 4.49 3.87 - 5.11 MIL/uL   Hemoglobin 12.9 12.0 - 15.0 g/dL   HCT 39.3 36.0 - 46.0 %   MCV 87.5 80.0 - 100.0 fL   MCH 28.7 26.0 - 34.0 pg   MCHC 32.8 30.0 - 36.0 g/dL   RDW 13.7 11.5 - 15.5 %   Platelets 309 150 - 400 K/uL   nRBC 0.0 0.0 - 0.2 %   Neutrophils Relative % 90 %   Neutro Abs 15.3 (H) 1.7 - 7.7 K/uL  Lymphocytes Relative 6 %   Lymphs Abs 1.1 0.7 - 4.0 K/uL   Monocytes Relative 4 %   Monocytes Absolute 0.8 0.1 - 1.0 K/uL   Eosinophils Relative 0 %   Eosinophils Absolute 0.1 0.0 - 0.5 K/uL   Basophils Relative 0 %   Basophils Absolute 0.1 0.0 - 0.1 K/uL   Immature Granulocytes 0 %   Abs Immature Granulocytes 0.06 0.00 - 0.07 K/uL    Comment: Performed at [facility], [address] [place], [place] [address]  Comprehensive metabolic panel     Status: Abnormal   Collection Time: 02/13/18  6:01 PM  Result Value Ref Range   Sodium 136 135 - 145 mmol/L   Potassium 3.7 3.5 - 5.1 mmol/L   Chloride 103 98 - 111 mmol/L   CO2 24 22 - 32 mmol/L   Glucose, Bld 103 (H) 70 - 99 mg/dL   BUN 13 6 - 20 mg/dL   Creatinine, Ser 0.71 0.44 - 1.00 mg/dL   Calcium 8.5 (L) 8.9 - 10.3 mg/dL   Total Protein 7.2 6.5 - 8.1 g/dL    Albumin 4.1 3.5 - 5.0 g/dL   AST 19 15 - 41 U/L   ALT 16 0 - 44 U/L   Alkaline Phosphatase 43 38 - 126 U/L   Total Bilirubin 1.1 0.3 - 1.2 mg/dL   GFR calc non Af Amer >60 >60 mL/min   GFR calc Af Amer >60 >60 mL/min    Comment: (NOTE) The eGFR has been calculated using the CKD EPI equation. This calculation has not been validated in all clinical situations. eGFR's persistently <60 mL/min signify possible Chronic Kidney Disease.    Anion gap 9 5 - 15    Comment: Performed at [facility], [address] [place], [place] [address]  Lipase, blood     Status: Abnormal   Collection Time: 02/13/18  6:01 PM  Result Value Ref Range   Lipase 109 (H) 11 - 51 U/L    Comment: Performed at [facility], [address] [place], [place] [address]  Basic metabolic panel     Status: Abnormal   Collection Time: 02/14/18  4:00 AM  Result Value Ref Range   Sodium 137 135 - 145 mmol/L   Potassium 3.6 3.5 - 5.1 mmol/L   Chloride 106 98 - 111 mmol/L   CO2 23 22 - 32 mmol/L   Glucose, Bld 127 (H) 70 - 99 mg/dL   BUN 8 6 - 20 mg/dL   Creatinine, Ser 0.64 0.44 - 1.00 mg/dL   Calcium 7.2 (L) 8.9 - 10.3 mg/dL   GFR calc non Af Amer >60 >60 mL/min   GFR calc Af Amer >60 >60 mL/min    Comment: (NOTE) The eGFR has been calculated using the CKD EPI equation. This calculation has not been validated in all clinical situations. eGFR's persistently <60 mL/min signify possible Chronic Kidney Disease.    Anion gap 8 5 - 15    Comment: Performed at [facility], [place] [address] [place], [place] [address]  Hepatic function panel     Status: Abnormal   Collection Time: 02/14/18  4:00 AM  Result Value Ref Range   Total Protein 5.8 (L) 6.5 - 8.1 g/dL   Albumin 3.1 (L) 3.5 - 5.0 g/dL   AST 15 15 - 41 U/L   ALT 12 0 - 44 U/L   Alkaline Phosphatase 37 (L) 38 -  126 U/L   Total Bilirubin 1.6 (H) 0.3 - 1.2 mg/dL   Bilirubin, Direct 0.3 (H) 0.0 - 0.2 mg/dL   Indirect  Bilirubin 1.3 (H) 0.3 - 0.9 mg/dL    Comment: Performed at [facility], [place] [address] [place], [place] [address]  CBC WITH DIFFERENTIAL     Status: Abnormal   Collection Time: 02/14/18  4:00 AM  Result Value Ref Range   WBC 19.7 (H) 4.0 - 10.5 K/uL   RBC 3.87 3.87 - 5.11 MIL/uL   Hemoglobin 11.1 (L) 12.0 - 15.0 g/dL   HCT 34.3 (L) 36.0 - 46.0 %   MCV 88.6 80.0 - 100.0 fL   MCH 28.7 26.0 - 34.0 pg   MCHC 32.4 30.0 - 36.0 g/dL   RDW 13.7 11.5 - 15.5 %   Platelets 270 150 - 400 K/uL   nRBC 0.0 0.0 - 0.2 %   Neutrophils Relative % 89 %   Neutro Abs 17.7 (H) 1.7 - 7.7 K/uL   Lymphocytes Relative 5 %   Lymphs Abs 0.9 0.7 - 4.0 K/uL   Monocytes Relative 5 %   Monocytes Absolute 0.9 0.1 - 1.0 K/uL   Eosinophils Relative 0 %   Eosinophils Absolute 0.0 0.0 - 0.5 K/uL   Basophils Relative 0 %   Basophils Absolute 0.1 0.0 - 0.1 K/uL   Immature Granulocytes 1 %   Abs Immature Granulocytes 0.09 (H) 0.00 - 0.07 K/uL    Comment: Performed at [facility], [place] [address], [place], [place] [address]  C-reactive protein     Status: Abnormal   Collection Time: 02/14/18  4:00 AM  Result Value Ref Range   CRP 7.0 (H) <1.0 mg/dL    Comment: Performed at [facility], [place] [address] [place], [place] [address]  Triglycerides     Status: None   Collection Time: 02/14/18  7:09 AM  Result Value Ref Range   Triglycerides 20 <150 mg/dL    Comment: Performed at [facility], [place] [address] [place], [place] [address]  Lactic acid, plasma     Status: None   Collection Time: 02/14/18  7:09 AM  Result Value Ref Range   Lactic Acid, Venous 0.8 0.5 - 1.9 mmol/L    Comment: Performed at [facility], [place] [address] [place], [place] [address]    Ct Abdomen Pelvis W Contrast  Result Date: 02/13/2018 CLINICAL DATA:  Nausea vomiting and abdominal pain EXAM: CT ABDOMEN AND PELVIS WITH CONTRAST TECHNIQUE:  Multidetector CT imaging of the abdomen and pelvis was performed using the standard protocol following bolus administration of intravenous contrast. CONTRAST:  133m ISOVUE-300 IOPAMIDOL (ISOVUE-300) INJECTION 61% COMPARISON:  CT 04/25/2007 FINDINGS: Lower chest: Lung bases demonstrate no acute consolidation or effusion. The heart size is normal Hepatobiliary: No focal liver abnormality is seen. Multiple calcified stones in the gallbladder. No biliary dilatation. Pancreas: Head, uncinate process and proximal body appear indistinct and enlarged. Moderate peripancreatic edema. No organized fluid collections. Normal enhancement of the pancreas. Spleen: Normal in size without focal abnormality. Adrenals/Urinary Tract: Adrenal glands are unremarkable. Kidneys are normal, without renal calculi, focal lesion, or hydronephrosis. Bladder is unremarkable. Stomach/Bowel: Stomach is within normal limits. Wall thickening of the duodenum and proximal jejunal loops. No dilated small bowel. No colon wall thickening. Negative appendix. Sigmoid colon diverticular disease. Collapsed appearance of the sigmoid colon. Vascular/Lymphatic: Normal enhancement of the portal vessels and splenic vein. Nonaneurysmal aorta. No significant adenopathy Reproductive:  Multiple hypodensities at the cervix may reflect nabothian cysts. Tampon in the vagina. Cyst in the right ovary measuring 2 cm. Other: Small free fluid in the pelvis.  No free air. Musculoskeletal: Degenerative changes at L5-S1. No acute or suspicious abnormality. IMPRESSION: 1. Enlarged indistinct appearance of the head, uncinate process and proximal body of the pancreas with surrounding fluid and moderate edema suspect for an acute pancreatitis. No organized fluid collection. No necrosis. 2. Thickened appearance of the duodenum and proximal jejunal bowel loops, either due to reactive inflammation from the pancreas inflammation or duodenitis/enteritis 3. Gallstones 4. Sigmoid colon  diverticular disease without acute inflammation 5. Small free fluid in the pelvis 6. Multiple low-density lesions in the cervix likely representing nabothian cysts, suggest nonemergent pelvic ultrasound correlation Electronically Signed   By: Donavan Foil M.D.   On: 02/13/2018 20:26   Mr Abdomen Mrcp Wo Contrast  Result Date: 02/14/2018 CLINICAL DATA:  Epigastric pain, vomiting, suspected acute pancreatitis on CT EXAM: MRI ABDOMEN WITHOUT CONTRAST TECHNIQUE: Multiplanar multisequence MR imaging was performed without the administration of intravenous contrast. COMPARISON:  CT abdomen/pelvis dated 02/13/2018 FINDINGS: Lower chest: Trace bilateral pleural effusions, left greater than right. Hepatobiliary: Liver is within normal limits. Layering small gallstones (series 3/image 40), without gallbladder wall thickening or pericholecystic fluid. No intrahepatic or extrahepatic ductal dilatation. Common duct measures 4 mm. No choledocholithiasis is seen. Pancreas: Peripancreatic fluid/inflammatory changes, suggesting acute pancreatitis. No pancreatic ductal dilatation. No discrete fluid collection/pseudocyst. Spleen:  Within normal limits. Adrenals/Urinary Tract:  Adrenal glands are within normal limits. Kidneys are within normal limits. Perinephric fluid along the left kidney is likely reactive. Stomach/Bowel: Stomach and visualized bowel are grossly unremarkable. Vascular/Lymphatic:  No evidence of abdominal aortic aneurysm. No suspicious abdominal lymphadenopathy. Other: Peripancreatic fluid/edema in the left upper abdomen along the pancreatic tail. Musculoskeletal: No focal osseous lesions. IMPRESSION: Acute pancreatitis.  No drainable fluid collection/pseudocyst. Cholelithiasis, without associated inflammatory changes. No choledocholithiasis is seen. Electronically Signed   By: Julian Hy M.D.   On: 02/14/2018 09:40   Mr 3d Recon At Scanner  Result Date: 02/14/2018 CLINICAL DATA:  Epigastric pain,  vomiting, suspected acute pancreatitis on CT EXAM: MRI ABDOMEN WITHOUT CONTRAST TECHNIQUE: Multiplanar multisequence MR imaging was performed without the administration of intravenous contrast. COMPARISON:  CT abdomen/pelvis dated 02/13/2018 FINDINGS: Lower chest: Trace bilateral pleural effusions, left greater than right. Hepatobiliary: Liver is within normal limits. Layering small gallstones (series 3/image 40), without gallbladder wall thickening or pericholecystic fluid. No intrahepatic or extrahepatic ductal dilatation. Common duct measures 4 mm. No choledocholithiasis is seen. Pancreas: Peripancreatic fluid/inflammatory changes, suggesting acute pancreatitis. No pancreatic ductal dilatation. No discrete fluid collection/pseudocyst. Spleen:  Within normal limits. Adrenals/Urinary Tract:  Adrenal glands are within normal limits. Kidneys are within normal limits. Perinephric fluid along the left kidney is likely reactive. Stomach/Bowel: Stomach and visualized bowel are grossly unremarkable. Vascular/Lymphatic:  No evidence of abdominal aortic aneurysm. No suspicious abdominal lymphadenopathy. Other: Peripancreatic fluid/edema in the left upper abdomen along the pancreatic tail. Musculoskeletal: No focal osseous lesions. IMPRESSION: Acute pancreatitis.  No drainable fluid collection/pseudocyst. Cholelithiasis, without associated inflammatory changes. No choledocholithiasis is seen. Electronically Signed   By: Julian Hy M.D.   On: 02/14/2018 09:40    Review of Systems  Constitutional: Negative for chills, diaphoresis and fever.  HENT: Negative.   Eyes: Negative.   Respiratory: Negative.   Cardiovascular: Negative.   Gastrointestinal: Positive for abdominal pain. Negative for nausea and vomiting.  Genitourinary: Negative.   Musculoskeletal: Negative.  Skin: Negative.   Neurological: Negative.   Endo/Heme/Allergies: Negative.   Psychiatric/Behavioral: Negative.    Blood pressure 116/71,  pulse 88, temperature 99.1 F (37.3 C), temperature source Oral, resp. rate 14, height '5\' 5"'  (1.651 m), weight 65.9 kg, last menstrual period 02/08/2018, SpO2 98 %. Physical Exam  Constitutional: She is oriented to person, place, and time. She appears well-developed and well-nourished. No distress.  HENT:  Head: Normocephalic and atraumatic.  Right Ear: External ear normal.  Left Ear: External ear normal.  Mouth/Throat: No oropharyngeal exudate.  Eyes: Pupils are equal, round, and reactive to light. Conjunctivae are normal. No scleral icterus.  Neck: Normal range of motion. Neck supple. No tracheal deviation present. No thyromegaly present.  Cardiovascular: Normal rate and regular rhythm.  No murmur heard. Respiratory: Effort normal and breath sounds normal. No respiratory distress. She has no wheezes.  GI: Soft. Bowel sounds are normal. She exhibits no distension and no mass. There is tenderness (epigastrium, mild tenderness diffusely). There is no rebound and no guarding.  Musculoskeletal: Normal range of motion. She exhibits no edema or deformity.  Neurological: She is alert and oriented to person, place, and time.  Skin: Skin is warm and dry. She is not diaphoretic.  Psychiatric: She has a normal mood and affect. Her behavior is normal.    Assessment/Plan: Biliary pancreatitis, cholelithiasis, elevated LFT's  Agree with medical admission  Bowel rest, NPO, ice chips, IV hydration  Repeat labs in AM 10/27  Will monitor with you.  Hopefully pancreatitis is self-limiting and resolves quickly.  Almost certainly etiology of pancreatitis is transient choledocholithiasis.  Would recommend cholecystectomy during this admission or in near future to prevent recurrence of pancreatitis.  Discussed surgery with patient and husband today.  Will continue to assess and discuss timing of surgery in near future.  Armandina Gemma, [facility] Office: [place] 02/14/2018, 1:04 PM

## 2018-02-15 DIAGNOSIS — K851 Biliary acute pancreatitis without necrosis or infection: Principal | ICD-10-CM

## 2018-02-15 LAB — COMPREHENSIVE METABOLIC PANEL
ALT: 10 U/L (ref 0–44)
AST: 13 U/L — ABNORMAL LOW (ref 15–41)
Albumin: 2.9 g/dL — ABNORMAL LOW (ref 3.5–5.0)
Alkaline Phosphatase: 37 U/L — ABNORMAL LOW (ref 38–126)
Anion gap: 8 (ref 5–15)
BUN: 7 mg/dL (ref 6–20)
CO2: 23 mmol/L (ref 22–32)
Calcium: 7.5 mg/dL — ABNORMAL LOW (ref 8.9–10.3)
Chloride: 107 mmol/L (ref 98–111)
Creatinine, Ser: 0.66 mg/dL (ref 0.44–1.00)
GFR calc Af Amer: >60 mL/min (ref >60)
GFR calc non Af Amer: >60 mL/min (ref >60)
Glucose, Bld: 81 mg/dL (ref 70–99)
Potassium: 3.3 mmol/L — ABNORMAL LOW (ref 3.5–5.1)
Sodium: 138 mmol/L (ref 135–145)
Total Bilirubin: 1.5 mg/dL — ABNORMAL HIGH (ref 0.3–1.2)
Total Protein: 5.7 g/dL — ABNORMAL LOW (ref 6.5–8.1)

## 2018-02-15 LAB — URINE CULTURE: Culture: NO GROWTH

## 2018-02-15 LAB — LIPASE, BLOOD: Lipase: 38 U/L (ref 11–51)

## 2018-02-15 LAB — MRSA PCR SCREENING: MRSA by PCR: NEGATIVE

## 2018-02-15 MED ORDER — POTASSIUM CHLORIDE 2 MEQ/ML IV SOLN
INTRAVENOUS | Status: DC
  Administered 2018-02-15 – 2018-02-16 (×2): via INTRAVENOUS
  Filled 2018-02-15 (×3): qty 1000

## 2018-02-15 MED ORDER — SODIUM CHLORIDE 0.9 % IV SOLN
INTRAVENOUS | Status: DC
  Administered 2018-02-15: 05:00:00 via INTRAVENOUS

## 2018-02-15 MED ORDER — CHLORHEXIDINE GLUCONATE CLOTH 2 % EX PADS
6.0000 | MEDICATED_PAD | Freq: Once | CUTANEOUS | Status: AC
  Administered 2018-02-15: 6 via TOPICAL

## 2018-02-15 MED ORDER — CHLORHEXIDINE GLUCONATE CLOTH 2 % EX PADS: 6.0000 | MEDICATED_PAD | Freq: Once | CUTANEOUS | Status: DC

## 2018-02-15 MED ORDER — CEFAZOLIN SODIUM-DEXTROSE 2-4 GM/100ML-% IV SOLN
2.0000 g | INTRAVENOUS | Status: DC
  Filled 2018-02-15: qty 100

## 2018-02-15 NOTE — Progress Notes (Signed)
Assessment & Plan: Biliary pancreatitis, cholelithiasis, elevated LFT's             Lipase improved to normal this AM  Persistent abd pain, radiation to back  Allow clear liquids today  Repeat labs in AM 10/28  Will tentatively plan lap chole for tomorrow (10/28) if continued improvement clinically  The risks and benefits of the procedure have been discussed at length with the patient.  The patient understands the proposed procedure, potential alternative treatments, and the course of recovery to be expected.  All of the patient's questions have been answered at this time.  The patient wishes to proceed with surgery.         Darnell Level, MD       [facility]       Office: [phone]   Chief Complaint: Biliary pancreatitis  Subjective: Patient up in chair, some abdominal pain, some distension.  Denies emesis.  Objective: Vital signs in last 24 hours: Temp:  [99.6 F (37.6 C)-99.9 F (37.7 C)] 99.8 F (37.7 C) (10/27 0359) Pulse Rate:  [88-93] 88 (10/27 0359) Resp:  [14-17] 14 (10/27 0359) BP: (105-120)/(67-74) 118/74 (10/27 0359) SpO2:  [95 %-99 %] 95 % (10/27 0359) Weight:  [66.6 kg] 66.6 kg (10/27 0424) Last BM Date: 02/13/18  Intake/Output from previous day: 10/26 0701 - 10/27 0700 In: 2167.6 [P.O.:60; I.V.:1907.6; IV Piggyback:200] Out: 1650 [Urine:1650] Intake/Output this shift: No intake/output data recorded.  Physical Exam: HEENT - sclerae clear, mucous membranes moist Neck - soft Chest - clear bilaterally Cor - RRR Abdomen - soft, mild distension; mild RUQ tenderness, no guarding Ext - no edema, non-tender Neuro - alert & oriented, no focal deficits  Lab Results:  Recent Labs    02/13/18 1801 02/14/18 0400  WBC 17.4* 19.7*  HGB 12.9 11.1*  HCT 39.3 34.3*  PLT 309 270   BMET Recent Labs    02/14/18 0400 02/15/18 0358  NA 137 138  K 3.6 3.3*  CL 106 107  CO2 23 23  GLUCOSE 127* 81  BUN 8 7  CREATININE 0.64 0.66    CALCIUM 7.2* 7.5*   PT/INR No results for input(s): LABPROT, INR in the last 72 hours. Comprehensive Metabolic Panel:    Component Value Date/Time   NA 138 02/15/2018 0358   NA 137 02/14/2018 0400   K 3.3 (L) 02/15/2018 0358   K 3.6 02/14/2018 0400   CL 107 02/15/2018 0358   CL 106 02/14/2018 0400   CO2 23 02/15/2018 0358   CO2 23 02/14/2018 0400   BUN 7 02/15/2018 0358   BUN 8 02/14/2018 0400   CREATININE 0.66 02/15/2018 0358   CREATININE 0.64 02/14/2018 0400   GLUCOSE 81 02/15/2018 0358   GLUCOSE 127 (H) 02/14/2018 0400   CALCIUM 7.5 (L) 02/15/2018 0358   CALCIUM 7.2 (L) 02/14/2018 0400   AST 13 (L) 02/15/2018 0358   AST 15 02/14/2018 0400   ALT 10 02/15/2018 0358   ALT 12 02/14/2018 0400   ALKPHOS 37 (L) 02/15/2018 0358   ALKPHOS 37 (L) 02/14/2018 0400   BILITOT 1.5 (H) 02/15/2018 0358   BILITOT 1.6 (H) 02/14/2018 0400   PROT 5.7 (L) 02/15/2018 0358   PROT 5.8 (L) 02/14/2018 0400   ALBUMIN 2.9 (L) 02/15/2018 0358   ALBUMIN 3.1 (L) 02/14/2018 0400    Studies/Results: Ct Abdomen Pelvis W Contrast  Result Date: 02/13/2018 CLINICAL DATA:  Nausea vomiting and abdominal pain EXAM: CT ABDOMEN AND PELVIS WITH  CONTRAST TECHNIQUE: Multidetector CT imaging of the abdomen and pelvis was performed using the standard protocol following bolus administration of intravenous contrast. CONTRAST:  ISOVUE-300 IOPAMIDOL (ISOVUE-300) INJECTION 61% COMPARISON:  CT 04/25/2007 FINDINGS: Lower chest: Lung bases demonstrate no acute consolidation or effusion. The heart size is normal Hepatobiliary: No focal liver abnormality is seen. Multiple calcified stones in the gallbladder. No biliary dilatation. Pancreas: Head, uncinate process and proximal body appear indistinct and enlarged. Moderate peripancreatic edema. No organized fluid collections. Normal enhancement of the pancreas. Spleen: Normal in size without focal abnormality. Adrenals/Urinary Tract: Adrenal glands are unremarkable. Kidneys  are normal, without renal calculi, focal lesion, or hydronephrosis. Bladder is unremarkable. Stomach/Bowel: Stomach is within normal limits. Wall thickening of the duodenum and proximal jejunal loops. No dilated small bowel. No colon wall thickening. Negative appendix. Sigmoid colon diverticular disease. Collapsed appearance of the sigmoid colon. Vascular/Lymphatic: Normal enhancement of the portal vessels and splenic vein. Nonaneurysmal aorta. No significant adenopathy Reproductive: Multiple hypodensities at the cervix may reflect nabothian cysts. Tampon in the vagina. Cyst in the right ovary measuring 2 cm. Other: Small free fluid in the pelvis.  No free air. Musculoskeletal: Degenerative changes at L5-S1. No acute or suspicious abnormality. IMPRESSION: 1. Enlarged indistinct appearance of the head, uncinate process and proximal body of the pancreas with surrounding fluid and moderate edema suspect for an acute pancreatitis. No organized fluid collection. No necrosis. 2. Thickened appearance of the duodenum and proximal jejunal bowel loops, either due to reactive inflammation from the pancreas inflammation or duodenitis/enteritis 3. Gallstones 4. Sigmoid colon diverticular disease without acute inflammation 5. Small free fluid in the pelvis 6. Multiple low-density lesions in the cervix likely representing nabothian cysts, suggest nonemergent pelvic ultrasound correlation Electronically Signed   By: Jasmine Pang Hoover.D.   On: 02/13/2018 20:26   Mr Abdomen Mrcp Wo Contrast  Result Date: 02/14/2018 CLINICAL DATA:  Epigastric pain, vomiting, suspected acute pancreatitis on CT EXAM: MRI ABDOMEN WITHOUT CONTRAST TECHNIQUE: Multiplanar multisequence MR imaging was performed without the administration of intravenous contrast. COMPARISON:  CT abdomen/pelvis dated 02/13/2018 FINDINGS: Lower chest: Trace bilateral pleural effusions, left greater than right. Hepatobiliary: Liver is within normal limits. Layering small  gallstones (series 3/image 40), without gallbladder wall thickening or pericholecystic fluid. No intrahepatic or extrahepatic ductal dilatation. Common duct measures 4 mm. No choledocholithiasis is seen. Pancreas: Peripancreatic fluid/inflammatory changes, suggesting acute pancreatitis. No pancreatic ductal dilatation. No discrete fluid collection/pseudocyst. Spleen:  Within normal limits. Adrenals/Urinary Tract:  Adrenal glands are within normal limits. Kidneys are within normal limits. Perinephric fluid along the left kidney is likely reactive. Stomach/Bowel: Stomach and visualized bowel are grossly unremarkable. Vascular/Lymphatic:  No evidence of abdominal aortic aneurysm. No suspicious abdominal lymphadenopathy. Other: Peripancreatic fluid/edema in the left upper abdomen along the pancreatic tail. Musculoskeletal: No focal osseous lesions. IMPRESSION: Acute pancreatitis.  No drainable fluid collection/pseudocyst. Cholelithiasis, without associated inflammatory changes. No choledocholithiasis is seen. Electronically Signed   By: Charline Bills Hoover.D.   On: 02/14/2018 09:40   Mr 3d Recon At Scanner  Result Date: 02/14/2018 CLINICAL DATA:  Epigastric pain, vomiting, suspected acute pancreatitis on CT EXAM: MRI ABDOMEN WITHOUT CONTRAST TECHNIQUE: Multiplanar multisequence MR imaging was performed without the administration of intravenous contrast. COMPARISON:  CT abdomen/pelvis dated 02/13/2018 FINDINGS: Lower chest: Trace bilateral pleural effusions, left greater than right. Hepatobiliary: Liver is within normal limits. Layering small gallstones (series 3/image 40), without gallbladder wall thickening or pericholecystic fluid. No intrahepatic or extrahepatic ductal dilatation. Common duct measures 4  mm. No choledocholithiasis is seen. Pancreas: Peripancreatic fluid/inflammatory changes, suggesting acute pancreatitis. No pancreatic ductal dilatation. No discrete fluid collection/pseudocyst. Spleen:  Within  normal limits. Adrenals/Urinary Tract:  Adrenal glands are within normal limits. Kidneys are within normal limits. Perinephric fluid along the left kidney is likely reactive. Stomach/Bowel: Stomach and visualized bowel are grossly unremarkable. Vascular/Lymphatic:  No evidence of abdominal aortic aneurysm. No suspicious abdominal lymphadenopathy. Other: Peripancreatic fluid/edema in the left upper abdomen along the pancreatic tail. Musculoskeletal: No focal osseous lesions. IMPRESSION: Acute pancreatitis.  No drainable fluid collection/pseudocyst. Cholelithiasis, without associated inflammatory changes. No choledocholithiasis is seen. Electronically Signed   By: Charline Bills Hoover.D.   On: 02/14/2018 09:40      [name] 02/15/2018  Patient ID: [name], female   DOB: 23-Feb-1971, 47 y.o.   MRN: [id number]

## 2018-02-15 NOTE — Progress Notes (Signed)
[facility] PROGRESS NOTE  Yvaine Jankowiak ZOX:[id number] DOB: 01-25-1971 DOA: 02/13/2018 PCP: Mila Palmer, MD   Narrative: 79 fem with IBS, well controlled asthma, Acne and ? Intussusception in past Admit with Pancreatitis of unknow etiology Ct showed Duodenitis vs pancreatitis MRI confirms acute pancreatitis with small layering GB stone   A & Plan Pancreatitis-bowel rest-saline-diet as per Gen surg-will get lap chole in am 10/28 Per Gen surg  IBS-improved-sees Dr. Leone Payor  Hypokalemia-mild-change fluids to LR + K and recheck am labs   Scd, full code, ip    [name], MD  [facility] Direct contact: [phone] --Via amion app OR  --[email or web address]; password [email or web address]  7PM-7AM contact night coverage as above 02/15/2018, 8:41 AM  LOS: 2 days   Consultants:  Gen surg  Procedures:  MRI  CT scan  Antimicrobials:  none  Interval history/Subjective:  Pain ~ 4-5/10-Central abd with rad and LUQ Didn't sleep well No fever, no chills tol ice chips  Was OOB by self want to shower  Objective:  Vitals:  Vitals:   02/14/18 2103 02/15/18 0359  BP: 120/70 118/74  Pulse: 90 88  Resp: 15 14  Temp: 99.9 F (37.7 C) 99.8 F (37.7 C)  SpO2: 98% 95%    Exam:  Awake alert  tired eomi ncat ctab Tender epigastrium, and LUQ No le edema Neuro is intact moving 4 limbs  I have personally reviewed the following:   Labs:  Bili up from 0.8-->1.6--1.5  TG 20  Lactic acid 0.8, PCT 7.0  Imaging studies:  mri  Medical tests:  n   Test discussed with performing physician:  n  Decision to obtain old records:  n  Review and summation of old records:  n  Scheduled Meds: Continuous Infusions: . famotidine (PEPCID) IV 20 mg (02/14/18 2219)  . lactated ringers with kcl      Principal Problem:   Gallstone pancreatitis Active Problems:   Gallstones   Acute pancreatitis   LOS: 2 days

## 2018-02-15 NOTE — Plan of Care (Signed)
Pt stable with no needs. No changes to care plans needed.

## 2018-02-16 ENCOUNTER — Inpatient Hospital Stay (HOSPITAL_COMMUNITY): Payer: BLUE CROSS/BLUE SHIELD | Admitting: Certified Registered Nurse Anesthetist

## 2018-02-16 ENCOUNTER — Encounter (HOSPITAL_COMMUNITY)
Admission: EM | Disposition: A | Discharge status: Home / Self Care | Payer: Self-pay | Source: Home / Self Care | Attending: Family Medicine

## 2018-02-16 ENCOUNTER — Encounter (HOSPITAL_COMMUNITY): Payer: Self-pay | Admitting: Certified Registered Nurse Anesthetist

## 2018-02-16 ENCOUNTER — Inpatient Hospital Stay (HOSPITAL_COMMUNITY): Payer: BLUE CROSS/BLUE SHIELD

## 2018-02-16 HISTORY — PX: CHOLECYSTECTOMY: SHX55

## 2018-02-16 LAB — CBC
HCT: 31.7 % — ABNORMAL LOW (ref 36.0–46.0)
Hemoglobin: 10 g/dL — ABNORMAL LOW (ref 12.0–15.0)
MCH: 28.6 pg (ref 26.0–34.0)
MCHC: 31.5 g/dL (ref 30.0–36.0)
MCV: 90.6 fL (ref 80.0–100.0)
Platelets: 250 10*3/uL (ref 150–400)
RBC: 3.5 MIL/uL — ABNORMAL LOW (ref 3.87–5.11)
RDW: 13.9 % (ref 11.5–15.5)
WBC: 20.4 10*3/uL — ABNORMAL HIGH (ref 4.0–10.5)
nRBC: 0 % (ref 0.0–0.2)

## 2018-02-16 LAB — COMPREHENSIVE METABOLIC PANEL
ALT: 10 U/L (ref 0–44)
AST: 11 U/L — ABNORMAL LOW (ref 15–41)
Albumin: 2.7 g/dL — ABNORMAL LOW (ref 3.5–5.0)
Alkaline Phosphatase: 37 U/L — ABNORMAL LOW (ref 38–126)
Anion gap: 9 (ref 5–15)
BUN: 6 mg/dL (ref 6–20)
CO2: 23 mmol/L (ref 22–32)
Calcium: 7.9 mg/dL — ABNORMAL LOW (ref 8.9–10.3)
Chloride: 107 mmol/L (ref 98–111)
Creatinine, Ser: 0.61 mg/dL (ref 0.44–1.00)
GFR calc Af Amer: >60 mL/min (ref >60)
GFR calc non Af Amer: >60 mL/min (ref >60)
Glucose, Bld: 98 mg/dL (ref 70–99)
Potassium: 3.6 mmol/L (ref 3.5–5.1)
Sodium: 139 mmol/L (ref 135–145)
Total Bilirubin: 1.3 mg/dL — ABNORMAL HIGH (ref 0.3–1.2)
Total Protein: 5.9 g/dL — ABNORMAL LOW (ref 6.5–8.1)

## 2018-02-16 LAB — LIPASE, BLOOD: Lipase: 27 U/L (ref 11–51)

## 2018-02-16 SURGERY — LAPAROSCOPIC CHOLECYSTECTOMY WITH INTRAOPERATIVE CHOLANGIOGRAM
Anesthesia: General | Site: Abdomen

## 2018-02-16 MED ORDER — FENTANYL CITRATE (PF) 100 MCG/2ML IJ SOLN
INTRAMUSCULAR | Status: AC
  Filled 2018-02-16: qty 2

## 2018-02-16 MED ORDER — LIDOCAINE 2% (20 MG/ML) 5 ML SYRINGE
INTRAMUSCULAR | Status: DC | PRN
  Administered 2018-02-16: 100 mg via INTRAVENOUS

## 2018-02-16 MED ORDER — OXYCODONE HCL 5 MG/5ML PO SOLN
5.0000 mg | Freq: Once | ORAL | Status: DC | PRN
  Filled 2018-02-16: qty 5

## 2018-02-16 MED ORDER — SUGAMMADEX SODIUM 200 MG/2ML IV SOLN
INTRAVENOUS | Status: DC | PRN
  Administered 2018-02-16: 150 mg via INTRAVENOUS

## 2018-02-16 MED ORDER — BUPIVACAINE-EPINEPHRINE 0.5% -1:200000 IJ SOLN
INTRAMUSCULAR | Status: DC | PRN
  Administered 2018-02-16: 10 mL

## 2018-02-16 MED ORDER — ONDANSETRON 4 MG PO TBDP: 4.0000 mg | ORAL_TABLET | Freq: Four times a day (QID) | ORAL | Status: DC | PRN

## 2018-02-16 MED ORDER — MIDAZOLAM HCL 2 MG/2ML IJ SOLN
INTRAMUSCULAR | Status: AC
  Filled 2018-02-16: qty 2

## 2018-02-16 MED ORDER — ROCURONIUM BROMIDE 10 MG/ML (PF) SYRINGE
PREFILLED_SYRINGE | INTRAVENOUS | Status: DC | PRN
  Administered 2018-02-16: 50 mg via INTRAVENOUS

## 2018-02-16 MED ORDER — KCL IN DEXTROSE-NACL 20-5-0.45 MEQ/L-%-% IV SOLN
INTRAVENOUS | Status: DC
  Administered 2018-02-16 – 2018-02-17 (×2): via INTRAVENOUS
  Filled 2018-02-16 (×2): qty 1000

## 2018-02-16 MED ORDER — FENTANYL CITRATE (PF) 100 MCG/2ML IJ SOLN: 25.0000 ug | INTRAMUSCULAR | Status: DC | PRN

## 2018-02-16 MED ORDER — PROMETHAZINE HCL 25 MG/ML IJ SOLN: 6.2500 mg | INTRAMUSCULAR | Status: DC | PRN

## 2018-02-16 MED ORDER — IOPAMIDOL (ISOVUE-300) INJECTION 61%
INTRAVENOUS | Status: AC
  Filled 2018-02-16: qty 50

## 2018-02-16 MED ORDER — HYDROMORPHONE HCL 1 MG/ML IJ SOLN: 1.0000 mg | INTRAMUSCULAR | Status: DC | PRN

## 2018-02-16 MED ORDER — LACTATED RINGERS IV SOLN
INTRAVENOUS | Status: DC
  Administered 2018-02-16: 08:00:00 via INTRAVENOUS

## 2018-02-16 MED ORDER — LACTATED RINGERS IR SOLN
Status: DC | PRN
  Administered 2018-02-16: 1000 mL

## 2018-02-16 MED ORDER — MIDAZOLAM HCL 5 MG/5ML IJ SOLN
INTRAMUSCULAR | Status: DC | PRN
  Administered 2018-02-16: 2 mg via INTRAVENOUS

## 2018-02-16 MED ORDER — DEXAMETHASONE SODIUM PHOSPHATE 10 MG/ML IJ SOLN
INTRAMUSCULAR | Status: AC
  Filled 2018-02-16: qty 1

## 2018-02-16 MED ORDER — HYDROCODONE-ACETAMINOPHEN 5-325 MG PO TABS: 1.0000 | ORAL_TABLET | ORAL | Status: DC | PRN

## 2018-02-16 MED ORDER — ONDANSETRON HCL 4 MG/2ML IJ SOLN
INTRAMUSCULAR | Status: AC
  Filled 2018-02-16: qty 2

## 2018-02-16 MED ORDER — ROCURONIUM BROMIDE 10 MG/ML (PF) SYRINGE
PREFILLED_SYRINGE | INTRAVENOUS | Status: AC
  Filled 2018-02-16: qty 10

## 2018-02-16 MED ORDER — CEFAZOLIN SODIUM-DEXTROSE 2-4 GM/100ML-% IV SOLN
2.0000 g | INTRAVENOUS | Status: AC
  Administered 2018-02-16: 2 g via INTRAVENOUS
  Filled 2018-02-16: qty 100

## 2018-02-16 MED ORDER — 0.9 % SODIUM CHLORIDE (POUR BTL) OPTIME
TOPICAL | Status: DC | PRN
  Administered 2018-02-16: 1000 mL

## 2018-02-16 MED ORDER — FENTANYL CITRATE (PF) 100 MCG/2ML IJ SOLN
INTRAMUSCULAR | Status: DC | PRN
  Administered 2018-02-16 (×2): 100 ug via INTRAVENOUS
  Administered 2018-02-16 (×2): 50 ug via INTRAVENOUS

## 2018-02-16 MED ORDER — OXYCODONE HCL 5 MG PO TABS: 5.0000 mg | ORAL_TABLET | Freq: Once | ORAL | Status: DC | PRN

## 2018-02-16 MED ORDER — ACETAMINOPHEN 650 MG RE SUPP: 650.0000 mg | Freq: Four times a day (QID) | RECTAL | Status: DC | PRN

## 2018-02-16 MED ORDER — CHLORHEXIDINE GLUCONATE CLOTH 2 % EX PADS: 6.0000 | MEDICATED_PAD | Freq: Once | CUTANEOUS | Status: DC

## 2018-02-16 MED ORDER — TRAMADOL HCL 50 MG PO TABS
50.0000 mg | ORAL_TABLET | Freq: Four times a day (QID) | ORAL | Status: DC | PRN
  Administered 2018-02-16: 50 mg via ORAL
  Filled 2018-02-16: qty 1

## 2018-02-16 MED ORDER — BUPIVACAINE-EPINEPHRINE 0.5% -1:200000 IJ SOLN
INTRAMUSCULAR | Status: AC
  Filled 2018-02-16: qty 1

## 2018-02-16 MED ORDER — IOPAMIDOL (ISOVUE-300) INJECTION 61%
INTRAVENOUS | Status: DC | PRN
  Administered 2018-02-16: 7 mL

## 2018-02-16 MED ORDER — SUGAMMADEX SODIUM 200 MG/2ML IV SOLN
INTRAVENOUS | Status: AC
  Filled 2018-02-16: qty 2

## 2018-02-16 MED ORDER — PROPOFOL 10 MG/ML IV BOLUS
INTRAVENOUS | Status: AC
  Filled 2018-02-16: qty 20

## 2018-02-16 MED ORDER — ONDANSETRON HCL 4 MG/2ML IJ SOLN
4.0000 mg | Freq: Four times a day (QID) | INTRAMUSCULAR | Status: DC | PRN
  Filled 2018-02-16: qty 2

## 2018-02-16 MED ORDER — ACETAMINOPHEN 325 MG PO TABS: 650.0000 mg | ORAL_TABLET | Freq: Four times a day (QID) | ORAL | Status: DC | PRN

## 2018-02-16 MED ORDER — LIDOCAINE 2% (20 MG/ML) 5 ML SYRINGE
INTRAMUSCULAR | Status: AC
  Filled 2018-02-16: qty 5

## 2018-02-16 MED ORDER — DEXAMETHASONE SODIUM PHOSPHATE 4 MG/ML IJ SOLN
INTRAMUSCULAR | Status: DC | PRN
  Administered 2018-02-16: 10 mg via INTRAVENOUS

## 2018-02-16 MED ORDER — PROPOFOL 10 MG/ML IV BOLUS
INTRAVENOUS | Status: DC | PRN
  Administered 2018-02-16: 160 mg via INTRAVENOUS
  Administered 2018-02-16: 40 mg via INTRAVENOUS

## 2018-02-16 SURGICAL SUPPLY — 30 items
APPLIER CLIP ROT 10 11.4 M/L (STAPLE) ×2
CABLE HIGH FREQUENCY MONO STRZ (ELECTRODE) ×2 IMPLANT
CHLORAPREP W/TINT 26ML (MISCELLANEOUS) ×4 IMPLANT
CLIP APPLIE ROT 10 11.4 M/L (STAPLE) ×1 IMPLANT
COVER MAYO STAND STRL (DRAPES) ×2 IMPLANT
COVER SURGICAL LIGHT HANDLE (MISCELLANEOUS) ×2 IMPLANT
COVER WAND RF STERILE (DRAPES) IMPLANT
DECANTER SPIKE VIAL GLASS SM (MISCELLANEOUS) ×2 IMPLANT
DRAPE C-ARM 42X120 X-RAY (DRAPES) ×2 IMPLANT
ELECT REM PT RETURN 15FT ADLT (MISCELLANEOUS) ×2 IMPLANT
GAUZE SPONGE 2X2 8PLY STRL LF (GAUZE/BANDAGES/DRESSINGS) ×1 IMPLANT
GLOVE SURG ORTHO 8.0 STRL STRW (GLOVE) ×2 IMPLANT
GOWN STRL REUS W/TWL XL LVL3 (GOWN DISPOSABLE) ×4 IMPLANT
HEMOSTAT SURGICEL 4X8 (HEMOSTASIS) IMPLANT
KIT BASIN OR (CUSTOM PROCEDURE TRAY) ×2 IMPLANT
POUCH SPECIMEN RETRIEVAL 10MM (ENDOMECHANICALS) ×2 IMPLANT
SCISSORS LAP 5X35 DISP (ENDOMECHANICALS) ×2 IMPLANT
SET CHOLANGIOGRAPH MIX (MISCELLANEOUS) ×2 IMPLANT
SET IRRIG TUBING LAPAROSCOPIC (IRRIGATION / IRRIGATOR) ×2 IMPLANT
SLEEVE XCEL OPT CAN 5 100 (ENDOMECHANICALS) ×2 IMPLANT
SPONGE GAUZE 2X2 STER 10/PKG (GAUZE/BANDAGES/DRESSINGS) ×1
STRIP CLOSURE SKIN 1/2X4 (GAUZE/BANDAGES/DRESSINGS) ×2 IMPLANT
SUT MNCRL AB 4-0 PS2 18 (SUTURE) ×2 IMPLANT
TOWEL OR 17X26 10 PK STRL BLUE (TOWEL DISPOSABLE) ×2 IMPLANT
TOWEL OR NON WOVEN STRL DISP B (DISPOSABLE) ×2 IMPLANT
TRAY LAPAROSCOPIC (CUSTOM PROCEDURE TRAY) ×2 IMPLANT
TROCAR BLADELESS OPT 5 100 (ENDOMECHANICALS) ×2 IMPLANT
TROCAR XCEL BLUNT TIP 100MML (ENDOMECHANICALS) ×2 IMPLANT
TROCAR XCEL NON-BLD 11X100MML (ENDOMECHANICALS) ×2 IMPLANT
TUBING INSUF HEATED (TUBING) IMPLANT

## 2018-02-16 NOTE — Discharge Instructions (Signed)
[facility] ° °Please arrive at least 30 min before your appointment to complete your check in paperwork.  If you are unable to arrive 30 min prior to your appointment time we may have to cancel or reschedule you. °LAPAROSCOPIC SURGERY: POST OP INSTRUCTIONS °Always review your discharge instruction sheet given to you by the facility where your surgery was performed. °IF YOU HAVE DISABILITY OR FAMILY LEAVE FORMS, YOU MUST BRING THEM TO THE OFFICE FOR PROCESSING.   °DO NOT GIVE THEM TO YOUR DOCTOR. ° °PAIN CONTROL ° °1. First take acetaminophen (Tylenol) AND/or ibuprofen (Advil) to control your pain after surgery.  Follow directions on package.  Taking acetaminophen (Tylenol) and/or ibuprofen (Advil) regularly after surgery will help to control your pain and lower the amount of prescription pain medication you may need.  You should not take more than 4,000 mg (4 grams) of acetaminophen (Tylenol) in 24 hours.  You should not take ibuprofen (Advil), aleve, motrin, naprosyn or other NSAIDS if you have a history of stomach ulcers or chronic kidney disease.  °2. A prescription for pain medication may be given to you upon discharge.  Take your pain medication as prescribed, if you still have uncontrolled pain after taking acetaminophen (Tylenol) or ibuprofen (Advil). °3. Use ice packs to help control pain. °4. If you need a refill on your pain medication, please contact your pharmacy.  They will contact our office to request authorization. Prescriptions will not be filled after 5pm or on week-ends. ° °HOME MEDICATIONS °5. Take your usually prescribed medications unless otherwise directed. ° °DIET °6. You should follow a light diet the first few days after arrival home.  Be sure to include lots of fluids daily. Avoid fatty, fried foods.  ° °CONSTIPATION °7. It is common to experience some constipation after surgery and if you are taking pain medication.  Increasing fluid intake and taking a stool  softener (such as Colace) will usually help or prevent this problem from occurring.  A mild laxative (Milk of Magnesia or Miralax) should be taken according to package instructions if there are no bowel movements after 48 hours. ° °WOUND/INCISION CARE °8. Most patients will experience some swelling and bruising in the area of the incisions.  Ice packs will help.  Swelling and bruising can take several days to resolve.  °9. Unless discharge instructions indicate otherwise, follow guidelines below  °a. STERI-STRIPS - you may remove your outer bandages 48 hours after surgery, and you may shower at that time.  You have steri-strips (small skin tapes) in place directly over the incision.  These strips should be left on the skin for 7-10 days.   °b. DERMABOND/SKIN GLUE - you may shower in 24 hours.  The glue will flake off over the next 2-3 weeks. °10. Any sutures or staples will be removed at the office during your follow-up visit. ° °ACTIVITIES °11. You may resume regular (light) daily activities beginning the next day--such as daily self-care, walking, climbing stairs--gradually increasing activities as tolerated.  You may have sexual intercourse when it is comfortable.  Refrain from any heavy lifting or straining until approved by your doctor. °a. You may drive when you are no longer taking prescription pain medication, you can comfortably wear a seatbelt, and you can safely maneuver your car and apply brakes. ° °FOLLOW-UP °12. You should see your doctor in the office for a follow-up appointment approximately 2-3 weeks after your surgery.  You should have been given your post-op/follow-up appointment when   your surgery was scheduled.  If you did not receive a post-op/follow-up appointment, make sure that you call for this appointment within a day or two after you arrive home to insure a convenient appointment time. ° °OTHER INSTRUCTIONS ° °WHEN TO CALL YOUR DOCTOR: °1. Fever over 101.0 °2. Inability to  urinate °3. Continued bleeding from incision. °4. Increased pain, redness, or drainage from the incision. °5. Increasing abdominal pain ° °The clinic staff is available to answer your questions during regular business hours.  Please don’t hesitate to call and ask to speak to one of the nurses for clinical concerns.  If you have a medical emergency, go to the nearest emergency room or call 911.  A surgeon from [facility] is always on call at the hospital. °[address], [address], [place], [place]  [address] ? [address] [address], [place], [place]   [address] °[phone] ? [phone] ? FAX [phone] ° ° ° °

## 2018-02-16 NOTE — Op Note (Signed)
Procedure Note  Pre-operative Diagnosis:  Biliary pancreatitis, cholelithiasis  Post-operative Diagnosis:  same  Surgeon:  Darnell Level, MD  Assistant:  none   Procedure:  Laparoscopic cholecystectomy with intra-operative cholangiography  Anesthesia:  General  Estimated Blood Loss:  minimal  Drains: none         Specimen: Gallbladder to pathology  Indications:  Patient admitted to medical service with biliary pancreatitis, cholelithiasis.  Now for cholecystectomy.  Procedure Details:  The patient was seen in the pre-op holding area. The risks, benefits, complications, treatment options, and expected outcomes were previously discussed with the patient. The patient agreed with the proposed plan and has signed the informed consent form.  The patient was brought to the Operating Room, identified as [name] and the procedure verified as laparoscopic cholecystectomy with intraoperative cholangiography. A "time out" was completed and the above information confirmed.  The patient was placed in the supine position. Following induction of general anesthesia, the abdomen was prepped and draped in the usual aseptic fashion.  An incision was made in the skin near the umbilicus. The midline fascia was incised and the peritoneal cavity was entered and a Hasson canula was introduced under direct vision.  The Hasson canula was secured with a 0-Vicryl pursestring suture. Pneumoperitoneum was established with carbon dioxide. Additional trocars were introduced under direct vision along the right costal margin in the midline, mid-clavicular line, and anterior axillary line.   The gallbladder was identified and the fundus grasped and retracted cephalad. There were moderate acute inflammatory changes and moderate edema.  Adhesions were taken down bluntly and the electrocautery was utilized as needed, taking care not to injure any adjacent structures. The infundibulum was grasped and retracted laterally,  exposing the peritoneum overlying the triangle of Calot. The peritoneum was incised and structures exposed with blunt dissection. The cystic duct was clearly identified, bluntly dissected circumferentially, and clipped at the neck of the gallbladder.  An incision was made in the cystic duct and the cholangiogram catheter introduced. The catheter was secured using an ligaclip.  Real-time cholangiography was performed using C-arm fluoroscopy.  There was rapid filling of a normal caliber common bile duct.  There was reflux of contrast into the left and right hepatic ductal systems.  There was free flow distally into the duodenum without filling defect or obstruction.  The catheter was removed from the peritoneal cavity.  The cystic duct was then ligated with surgical clips and divided. The cystic artery was identified, dissected circumferentially, ligated with ligaclips, and divided.  The gallbladder was dissected away from the liver bed using the electrocautery for hemostasis. The gallbladder was completely removed from the liver and placed into an endocatch bag. The gallbladder was removed in the endocatch bag through the umbilical port site and submitted to pathology for review.  The right upper quadrant was irrigated and the gallbladder bed was inspected. Hemostasis was achieved with the electrocautery.  Pneumoperitoneum was released after viewing removal of the trocars with good hemostasis noted. The umbilical wound was irrigated and the fascia was then closed with the pursestring suture.  Local anesthetic was infiltrated at all port sites. The skin incisions were closed with 4-0 Monocril subcuticular sutures and steri-strips and dressings were applied.  Instrument, sponge, and needle counts were correct at the conclusion of the case.  The patient was awakened from anesthesia and brought to the recovery room in stable condition.  The patient tolerated the procedure well.   Darnell Level, MD [facility] Office:  [phone]   

## 2018-02-16 NOTE — Progress Notes (Signed)
[facility] PROGRESS NOTE  [name] ZOX:[id number] DOB: 05-Nov-1970 DOA: 02/13/2018 PCP: Mila Palmer, MD   Narrative: 60 fem with IBS, well controlled asthma, Acne and ? Intussusception in past Admit with Pancreatitis of unknow etiology Ct showed Duodenitis vs pancreatitis MRI confirms acute pancreatitis with small layering GB stone   A & Plan Pancreatitis-bowel rest-saline-diet as per Gen surg  GS pancreatitis-uincomplicated lap chole 10/28-Per Gen surg-expectant management and ? D/c am if all remains stable Continue IV pain control and convert to PO tramadol/Norco once taking PO well  IBS-queiscent-OP f/u Dr. Leone Payor  Hypokalemia-mild-on IV C K-stop once eating-recheck labs am  Leukocytosis-likely SIRS from pancreatitis-no fever no chills   Scd, full code, ip    Darlene Menghini, MD  [facility] Direct contact: [phone] --Via amion app OR  --[email or web address]; password [email or web address]  7PM-7AM contact night coverage as above 02/16/2018, 12:06 PM  LOS: 3 days   Consultants:  Gen surg  Procedures:  MRI  CT scan  Antimicrobials:  none  Interval history/Subjective:  S/p op Seen on floor Pain mod No fever hasnt eaten yet  Objective:  Vitals:  Vitals:   02/16/18 1046 02/16/18 1149  BP: 126/76 118/77  Pulse: 92 88  Resp: 16 16  Temp: 98.3 F (36.8 C) 98.6 F (37 C)  SpO2: 100% 97%    Exam:  Awake alert  eomi ncat ctab Tender epigastrium, and LUQ-Multiple wounds No le edema Neuro is intact moving 4 limbs  I have personally reviewed the following:   Labs:  Bili up from 0.8-->1.6--1.5->1.3  TG 20  Lactic acid 0.8, PCT 7.0  Imaging studies:  Mri  LAp chole and Cholangiogram 10/28 Dr. Silvano Rusk  Medical tests:  n   Test discussed with performing physician:  n  Decision to obtain old records:  n  Review and summation of old records:  n  Scheduled Meds: Continuous Infusions: . dextrose 5 % and 0.45 % NaCl with KCl  20 mEq/L      Principal Problem:   Gallstone pancreatitis Active Problems:   Gallstones   Acute pancreatitis   LOS: 3 days

## 2018-02-16 NOTE — Progress Notes (Signed)
Pt has not had recent pregnancy test. Dr. Mal Amabile discussed this with pt and pt states there is no chance she could be pregnant, she just finished her menstrual cycle, and is on birth control. Dr. Mal Amabile asked pt if she wanted pregnancy test prior to surgery, Pt verbalized that she does not wish to have pregnancy test prior to surgery.

## 2018-02-16 NOTE — Anesthesia Procedure Notes (Signed)
Procedure Name: Intubation Date/Time: 02/16/2018 8:48 AM Performed by: Vanessa Stockdale, CRNA Pre-anesthesia Checklist: Patient identified, Emergency Drugs available, Suction available and Patient being monitored Patient Re-evaluated:Patient Re-evaluated prior to induction Oxygen Delivery Method: Circle system utilized Preoxygenation: Pre-oxygenation with 100% oxygen Induction Type: IV induction Ventilation: Mask ventilation without difficulty Laryngoscope Size: 2 and Miller Grade View: Grade I Tube type: Oral Tube size: 7.0 mm Number of attempts: 1 Airway Equipment and Method: Stylet Placement Confirmation: ETT inserted through vocal cords under direct vision,  positive ETCO2 and breath sounds checked- equal and bilateral Secured at: 20 cm Tube secured with: Tape Dental Injury: Teeth and Oropharynx as per pre-operative assessment

## 2018-02-16 NOTE — Transfer of Care (Signed)
Immediate Anesthesia Transfer of Care Note  Patient: [name]  Procedure(s) Performed: LAPAROSCOPIC CHOLECYSTECTOMY WITH INTRAOPERATIVE CHOLANGIOGRAM (N/A Abdomen)  Patient Location: PACU  Anesthesia Type:General  Level of Consciousness: awake, alert , oriented and patient cooperative  Airway & Oxygen Therapy: Patient Spontanous Breathing and Patient connected to face mask  Post-op Assessment: Report given to RN and Post -op Vital signs reviewed and stable  Post vital signs: Reviewed and stable  Last Vitals:  Vitals Value Taken Time  BP 126/88 02/16/2018 10:04 AM  Temp    Pulse 99 02/16/2018 10:05 AM  Resp 19 02/16/2018 10:05 AM  SpO2 98 % 02/16/2018 10:05 AM  Vitals shown include unvalidated device data.  Last Pain:  Vitals:   02/16/18 0805  TempSrc: Oral  PainSc:       Patients Stated Pain Goal: 4 (02/16/18 0802)  Complications: No apparent anesthesia complications

## 2018-02-16 NOTE — Anesthesia Preprocedure Evaluation (Addendum)
Anesthesia Evaluation  Patient identified by MRN, date of birth, ID band Patient awake    Reviewed: Allergy & Precautions, NPO status , Patient's Chart, lab work & pertinent test results  History of Anesthesia Complications Negative for: history of anesthetic complications  Airway Mallampati: I  TM Distance: >3 FB Neck ROM: Full    Dental  (+) Dental Advisory Given, Teeth Intact   Pulmonary asthma (exercise-induced) ,    breath sounds clear to auscultation       Cardiovascular negative cardio ROS   Rhythm:Regular Rate:Normal     Neuro/Psych negative neurological ROS  negative psych ROS   GI/Hepatic Neg liver ROS,  Biliary cholelithiasis IBS Pancreatitis Hx ischemic colitis, jejunal intussusception    Endo/Other  negative endocrine ROS  Renal/GU negative Renal ROS     Musculoskeletal negative musculoskeletal ROS (+)   Abdominal   Peds  Hematology negative hematology ROS (+)   Anesthesia Other Findings   Reproductive/Obstetrics                            Anesthesia Physical Anesthesia Plan  ASA: II  Anesthesia Plan: General   Post-op Pain Management:    Induction: Intravenous  PONV Risk Score and Plan: 4 or greater and Treatment may vary due to age or medical condition, Ondansetron, Scopolamine patch - Pre-op, Midazolam and Dexamethasone  Airway Management Planned: Oral ETT  Additional Equipment: None  Intra-op Plan:   Post-operative Plan: Extubation in OR  Informed Consent: I have reviewed the patients History and Physical, chart, labs and discussed the procedure including the risks, benefits and alternatives for the proposed anesthesia with the patient or authorized representative who has indicated his/her understanding and acceptance.   Dental advisory given  Plan Discussed with: CRNA and Anesthesiologist  Anesthesia Plan Comments:        Anesthesia Quick  Evaluation

## 2018-02-16 NOTE — Progress Notes (Signed)
Assessment & Plan: Biliary pancreatitis, cholelithiasis, elevated LFT's Lipase normal this AM, LFT's normal, WBC remains elevated at 20K (?)             Pain largely relieved             Plan lap chole with IOC today  The risks and benefits of the procedure have been discussed at length with the patient.  The patient understands the proposed procedure, potential alternative treatments, and the course of recovery to be expected.  All of the patient's questions have been answered at this time.  The patient wishes to proceed with surgery.        Darnell Level, MD       [facility]       Office: [phone]   Chief Complaint: Abdominal pain, biliary pancreatitis  Subjective: Patient comfortable, ready for surgery  Objective: Vital signs in last 24 hours: Temp:  [98.7 F (37.1 C)-98.9 F (37.2 C)] 98.7 F (37.1 C) (10/28 0625) Pulse Rate:  [84-90] 90 (10/28 0625) Resp:  [15-18] 18 (10/28 0625) BP: (102-120)/(63-67) 117/63 (10/28 0625) SpO2:  [96 %-98 %] 96 % (10/28 0625) Last BM Date: 02/15/18  Intake/Output from previous day: 10/27 0701 - 10/28 0700 In: 2708.2 [P.O.:1260; I.V.:1398.2; IV Piggyback:50] Out: 2300 [Urine:2300] Intake/Output this shift: No intake/output data recorded.  Physical Exam: HEENT - sclerae clear, mucous membranes moist Neck - soft Chest - clear bilaterally Cor - RRR Abdomen - soft without distension; non-tender; no mass Ext - no edema, non-tender Neuro - alert & oriented, no focal deficits  Lab Results:  Recent Labs    02/14/18 0400 02/16/18 0413  WBC 19.7* 20.4*  HGB 11.1* 10.0*  HCT 34.3* 31.7*  PLT 270 250   BMET Recent Labs    02/15/18 0358 02/16/18 0413  NA 138 139  K 3.3* 3.6  CL 107 107  CO2 23 23  GLUCOSE 81 98  BUN 7 6  CREATININE 0.66 0.61  CALCIUM 7.5* 7.9*   PT/INR No results for input(s): LABPROT, INR in the last 72 hours. Comprehensive Metabolic Panel:    Component Value  Date/Time   NA 139 02/16/2018 0413   NA 138 02/15/2018 0358   K 3.6 02/16/2018 0413   K 3.3 (L) 02/15/2018 0358   CL 107 02/16/2018 0413   CL 107 02/15/2018 0358   CO2 23 02/16/2018 0413   CO2 23 02/15/2018 0358   BUN 6 02/16/2018 0413   BUN 7 02/15/2018 0358   CREATININE 0.61 02/16/2018 0413   CREATININE 0.66 02/15/2018 0358   GLUCOSE 98 02/16/2018 0413   GLUCOSE 81 02/15/2018 0358   CALCIUM 7.9 (L) 02/16/2018 0413   CALCIUM 7.5 (L) 02/15/2018 0358   AST 11 (L) 02/16/2018 0413   AST 13 (L) 02/15/2018 0358   ALT 10 02/16/2018 0413   ALT 10 02/15/2018 0358   ALKPHOS 37 (L) 02/16/2018 0413   ALKPHOS 37 (L) 02/15/2018 0358   BILITOT 1.3 (H) 02/16/2018 0413   BILITOT 1.5 (H) 02/15/2018 0358   PROT 5.9 (L) 02/16/2018 0413   PROT 5.7 (L) 02/15/2018 0358   ALBUMIN 2.7 (L) 02/16/2018 0413   ALBUMIN 2.9 (L) 02/15/2018 0358    Studies/Results: Mr Abdomen Mrcp Wo Contrast  Result Date: 02/14/2018 CLINICAL DATA:  Epigastric pain, vomiting, suspected acute pancreatitis on CT EXAM: MRI ABDOMEN WITHOUT CONTRAST TECHNIQUE: Multiplanar multisequence MR imaging was performed without the administration of intravenous contrast. COMPARISON:  CT abdomen/pelvis dated 02/13/2018  FINDINGS: Lower chest: Trace bilateral pleural effusions, left greater than right. Hepatobiliary: Liver is within normal limits. Layering small gallstones (series 3/image 40), without gallbladder wall thickening or pericholecystic fluid. No intrahepatic or extrahepatic ductal dilatation. Common duct measures 4 mm. No choledocholithiasis is seen. Pancreas: Peripancreatic fluid/inflammatory changes, suggesting acute pancreatitis. No pancreatic ductal dilatation. No discrete fluid collection/pseudocyst. Spleen:  Within normal limits. Adrenals/Urinary Tract:  Adrenal glands are within normal limits. Kidneys are within normal limits. Perinephric fluid along the left kidney is likely reactive. Stomach/Bowel: Stomach and visualized bowel  are grossly unremarkable. Vascular/Lymphatic:  No evidence of abdominal aortic aneurysm. No suspicious abdominal lymphadenopathy. Other: Peripancreatic fluid/edema in the left upper abdomen along the pancreatic tail. Musculoskeletal: No focal osseous lesions. IMPRESSION: Acute pancreatitis.  No drainable fluid collection/pseudocyst. Cholelithiasis, without associated inflammatory changes. No choledocholithiasis is seen. Electronically Signed   By: Charline Bills Hoover.D.   On: 02/14/2018 09:40   Mr 3d Recon At Scanner  Result Date: 02/14/2018 CLINICAL DATA:  Epigastric pain, vomiting, suspected acute pancreatitis on CT EXAM: MRI ABDOMEN WITHOUT CONTRAST TECHNIQUE: Multiplanar multisequence MR imaging was performed without the administration of intravenous contrast. COMPARISON:  CT abdomen/pelvis dated 02/13/2018 FINDINGS: Lower chest: Trace bilateral pleural effusions, left greater than right. Hepatobiliary: Liver is within normal limits. Layering small gallstones (series 3/image 40), without gallbladder wall thickening or pericholecystic fluid. No intrahepatic or extrahepatic ductal dilatation. Common duct measures 4 mm. No choledocholithiasis is seen. Pancreas: Peripancreatic fluid/inflammatory changes, suggesting acute pancreatitis. No pancreatic ductal dilatation. No discrete fluid collection/pseudocyst. Spleen:  Within normal limits. Adrenals/Urinary Tract:  Adrenal glands are within normal limits. Kidneys are within normal limits. Perinephric fluid along the left kidney is likely reactive. Stomach/Bowel: Stomach and visualized bowel are grossly unremarkable. Vascular/Lymphatic:  No evidence of abdominal aortic aneurysm. No suspicious abdominal lymphadenopathy. Other: Peripancreatic fluid/edema in the left upper abdomen along the pancreatic tail. Musculoskeletal: No focal osseous lesions. IMPRESSION: Acute pancreatitis.  No drainable fluid collection/pseudocyst. Cholelithiasis, without associated  inflammatory changes. No choledocholithiasis is seen. Electronically Signed   By: Charline Bills Hoover.D.   On: 02/14/2018 09:40      [name] 02/16/2018  Patient ID: [name], female   DOB: 03-Jul-1970, 47 y.o.   MRN: [id number]

## 2018-02-17 ENCOUNTER — Encounter (HOSPITAL_COMMUNITY): Payer: Self-pay | Admitting: Surgery

## 2018-02-17 LAB — CBC WITH DIFFERENTIAL/PLATELET
Abs Immature Granulocytes: 0.1 10*3/uL — ABNORMAL HIGH (ref 0.00–0.07)
Basophils Absolute: 0 10*3/uL (ref 0.0–0.1)
Basophils Relative: 0 %
Eosinophils Absolute: 0.1 10*3/uL (ref 0.0–0.5)
Eosinophils Relative: 0 %
HCT: 30.3 % — ABNORMAL LOW (ref 36.0–46.0)
Hemoglobin: 10 g/dL — ABNORMAL LOW (ref 12.0–15.0)
Immature Granulocytes: 1 %
Lymphocytes Relative: 5 %
Lymphs Abs: 1 10*3/uL (ref 0.7–4.0)
MCH: 29 pg (ref 26.0–34.0)
MCHC: 33 g/dL (ref 30.0–36.0)
MCV: 87.8 fL (ref 80.0–100.0)
Monocytes Absolute: 1.2 10*3/uL — ABNORMAL HIGH (ref 0.1–1.0)
Monocytes Relative: 6 %
Neutro Abs: 18.3 10*3/uL — ABNORMAL HIGH (ref 1.7–7.7)
Neutrophils Relative %: 88 %
Platelets: 328 10*3/uL (ref 150–400)
RBC: 3.45 MIL/uL — ABNORMAL LOW (ref 3.87–5.11)
RDW: 13.8 % (ref 11.5–15.5)
WBC: 20.7 10*3/uL — ABNORMAL HIGH (ref 4.0–10.5)
nRBC: 0 % (ref 0.0–0.2)

## 2018-02-17 LAB — COMPREHENSIVE METABOLIC PANEL
ALT: 38 U/L (ref 0–44)
AST: 57 U/L — ABNORMAL HIGH (ref 15–41)
Albumin: 2.8 g/dL — ABNORMAL LOW (ref 3.5–5.0)
Alkaline Phosphatase: 99 U/L (ref 38–126)
Anion gap: 6 (ref 5–15)
BUN: 7 mg/dL (ref 6–20)
CO2: 25 mmol/L (ref 22–32)
Calcium: 8 mg/dL — ABNORMAL LOW (ref 8.9–10.3)
Chloride: 108 mmol/L (ref 98–111)
Creatinine, Ser: 0.53 mg/dL (ref 0.44–1.00)
GFR calc Af Amer: >60 mL/min (ref >60)
GFR calc non Af Amer: >60 mL/min (ref >60)
Glucose, Bld: 131 mg/dL — ABNORMAL HIGH (ref 70–99)
Potassium: 4 mmol/L (ref 3.5–5.1)
Sodium: 139 mmol/L (ref 135–145)
Total Bilirubin: 0.9 mg/dL (ref 0.3–1.2)
Total Protein: 6.1 g/dL — ABNORMAL LOW (ref 6.5–8.1)

## 2018-02-17 MED ORDER — TRAMADOL HCL 50 MG PO TABS: 50.0000 mg | ORAL_TABLET | Freq: Four times a day (QID) | ORAL | Status: DC | PRN

## 2018-02-17 MED ORDER — TRAMADOL HCL 50 MG PO TABS: 50.0000 mg | ORAL_TABLET | Freq: Four times a day (QID) | ORAL | 0 refills | Status: DC | PRN

## 2018-02-17 MED ORDER — HYDROCODONE-ACETAMINOPHEN 5-325 MG PO TABS: 1.0000 | ORAL_TABLET | ORAL | 0 refills | Status: DC | PRN

## 2018-02-17 MED ORDER — HYDROMORPHONE HCL 1 MG/ML IJ SOLN: 1.0000 mg | INTRAMUSCULAR | Status: DC | PRN

## 2018-02-17 MED FILL — Ondansetron HCl Inj 4 MG/2ML (2 MG/ML): INTRAMUSCULAR | Qty: 2 | Status: AC

## 2018-02-17 NOTE — Progress Notes (Addendum)
[facility] Progress Note  1 Day Post-Op  Subjective: CC-  Sitting up in bed. States that she feels much better than prior to surgery. States that she did get full very quickly with dinner last night. Just ate breakfast and currently feeling well. Denies n/v. Passing some flatus.   Objective: Vital signs in last 24 hours: Temp:  [98.1 F (36.7 C)-98.9 F (37.2 C)] 98.5 F (36.9 C) (10/29 0510) Pulse Rate:  [72-105] 72 (10/29 0510) Resp:  [16-18] 18 (10/29 0510) BP: (116-129)/(73-93) 116/73 (10/29 0510) SpO2:  [95 %-100 %] 97 % (10/29 0510) Last BM Date: 02/16/18  Intake/Output from previous day: 10/28 0701 - 10/29 0700 In: 1885.8 [P.O.:960; I.V.:925.8] Out: 2910 [Urine:2900; Blood:10] Intake/Output this shift: No intake/output data recorded.  PE: Gen:  Alert, NAD, pleasant HEENT: EOM's intact, pupils equal and round Pulm:  effort normal Abd: Soft, ND, NT, +BS, lap incisions cdi Skin: no rashes noted, warm and dry  Lab Results:  Recent Labs    02/16/18 0413 02/17/18 0508  WBC 20.4* 20.7*  HGB 10.0* 10.0*  HCT 31.7* 30.3*  PLT 250 328   BMET Recent Labs    02/16/18 0413 02/17/18 0508  NA 139 139  K 3.6 4.0  CL 107 108  CO2 23 25  GLUCOSE 98 131*  BUN 6 7  CREATININE 0.61 0.53  CALCIUM 7.9* 8.0*   PT/INR No results for input(s): LABPROT, INR in the last 72 hours. CMP     Component Value Date/Time   NA 139 02/17/2018 0508   K 4.0 02/17/2018 0508   CL 108 02/17/2018 0508   CO2 25 02/17/2018 0508   GLUCOSE 131 (H) 02/17/2018 0508   BUN 7 02/17/2018 0508   CREATININE 0.53 02/17/2018 0508   CALCIUM 8.0 (L) 02/17/2018 0508   PROT 6.1 (L) 02/17/2018 0508   ALBUMIN 2.8 (L) 02/17/2018 0508   AST 57 (H) 02/17/2018 0508   ALT 38 02/17/2018 0508   ALKPHOS 99 02/17/2018 0508   BILITOT 0.9 02/17/2018 0508   GFRNONAA >60 02/17/2018 0508   GFRAA >60 02/17/2018 0508   Lipase     Component Value Date/Time   LIPASE 27 02/16/2018 0413        Studies/Results: Dg Cholangiogram Operative  Result Date: 02/16/2018 CLINICAL DATA:  Intraoperative cholangiogram during laparoscopic cholecystectomy. EXAM: INTRAOPERATIVE CHOLANGIOGRAM FLUOROSCOPY TIME:  18 seconds COMPARISON:  MRCP-02/14/2018; CT abdomen pelvis-02/13/2018 FINDINGS: Intraoperative cholangiographic images of the right upper abdominal quadrant during laparoscopic cholecystectomy are provided for review. Surgical clips overlie the expected location of the gallbladder fossa. Contrast injection demonstrates selective cannulation of the central aspect of the cystic duct. There is passage of contrast through the central aspect of the cystic duct with filling of a non dilated common bile duct. There is passage of contrast though the CBD and into the descending portion of the duodenum. There is minimal reflux of injected contrast into the common hepatic duct and central aspect of the non dilated intrahepatic biliary system. There are no discrete filling defects within the opacified portions of the biliary system to suggest the presence of choledocholithiasis. IMPRESSION: No evidence of choledocholithiasis. Electronically Signed   By: Simonne Come M.D.   On: 02/16/2018 09:50    Anti-infectives: Anti-infectives (From admission, onward)   Start     Dose/Rate Route Frequency Ordered Stop   02/16/18 0800  ceFAZolin (ANCEF) IVPB 2g/100 mL premix     2 g 200 mL/hr over 30 Minutes Intravenous On call to O.R. 02/16/18  4132 02/16/18 0859   02/16/18 0600  ceFAZolin (ANCEF) IVPB 2g/100 mL premix  Status:  Discontinued     2 g 200 mL/hr over 30 Minutes Intravenous On call to O.R. 02/15/18 1241 02/16/18 0757       Assessment/Plan Gallstone pancreatitis S/p lap chole with IOC 10/28 Dr. Gerrit Friends - IOC negative - POD 1 - LFTs trending down  ID - ancef periop FEN - soft diet VTE - SCDs, ok for chemical DVT prophylaxis from surgical standpoint Foley - none  Plan - Advance to soft diet.  If patient tolerating diet and pain still controlled on oral medications she is stable for discharge from surgical standpoint. F/u info and discharge instructions on AVS. rx for tramadol sent to pharmacy   LOS: 4 days    Franne Forts , [facility] 02/17/2018, 8:15 AM Pager: [phone] Mon 7:00 am -11:30 AM Tues-Fri 7:00 am-4:30 pm Sat-Sun 7:00 am-11:30 am

## 2018-02-17 NOTE — Discharge Summary (Signed)
Physician Discharge Summary  [name] UJW:[id number] DOB: [birth year] DOA: 02/13/2018  PCP: Mila Palmer, MD  Admit date: 02/13/2018 Discharge date: 02/17/2018  Time spent: 25 minutes  Recommendations for Outpatient Follow-up:  1. OP cbc and diff needed as slightly high in hopsital 2. Needs OP gen surg follow-up  Discharge Diagnoses:  Principal Problem:   Gallstone pancreatitis Active Problems:   Gallstones   Acute pancreatitis   Discharge Condition: improved  Diet recommendation: soft  Filed Weights   02/14/18 0500 02/15/18 0424 02/16/18 0802  Weight: 65.9 kg 66.6 kg 66.6 kg    History of present illness:  47 fem with IBS, well controlled asthma, Acne and ? Intussusception in past Admit with Pancreatitis of unknow etiology Ct showed Duodenitis vs pancreatitis MRI confirms acute pancreatitis with small layering GB stone   Hospital Course:  Pancreatitis-was initially treated by drip and suck and then had surgery  GS pancreatitis-uncomplicated lap chole 10/28-Per Gen surg-expectant management and d/c on tramadol on d/c home tol diet  IBS-queiscent-OP f/u Dr. Leone Payor  Hypokalemia-mild-on IV C K-stop once eating-recheck labs am  Leukocytosis-likely SIRS from pancreatitis-no fever no chills--need OP repeat    Discharge Exam: Vitals:   02/16/18 2207 02/17/18 0510  BP: 122/78 116/73  Pulse: 73 72  Resp: 18 18  Temp: 98.9 F (37.2 C) 98.5 F (36.9 C)  SpO2: 96% 97%    General: awake alert pleasant in nad Cardiovascular: s1 s 2no m/r/g Respiratory: clear no added sound abd soft nt nd no rebound no gaurd  Discharge Instructions   Discharge Instructions    Diet - low sodium heart healthy   Complete by:  As directed    Discharge instructions   Complete by:  As directed    Follow weight bearing guidelines as per general surgery and dietary restriction Please follow with Dr. Silvano Rusk  Get labs in 1 week as your white count was slighlty high-may  need to re-check   Increase activity slowly   Complete by:  As directed      Allergies as of 02/17/2018   No Known Allergies     Medication List    STOP taking these medications   cholecalciferol 1000 units tablet Commonly known as:  VITAMIN D   EQL OMEGA 3 FISH OIL 1400 MG Caps   multivitamin with minerals Tabs tablet     TAKE these medications   ACZONE 7.5 % Gel Generic drug:  Dapsone   ALIGN 4 MG Caps Take 1 capsule by mouth daily.   calcium-vitamin D 500-200 MG-UNIT tablet Commonly known as:  OSCAL WITH D Take 2 tablets by mouth daily with breakfast.   cetirizine 10 MG tablet Commonly known as:  ZYRTEC Take 10 mg by mouth daily.   cyclobenzaprine 10 MG tablet Commonly known as:  FLEXERIL Take 10 mg by mouth 3 (three) times daily as needed for muscle spasms.   diclofenac 75 MG EC tablet Commonly known as:  VOLTAREN Take 75 mg by mouth 2 (two) times daily as needed for mild pain.   fexofenadine 180 MG tablet Commonly known as:  ALLEGRA Take 180 mg by mouth daily.   GLUCOSAMINE-CHONDROITIN PO Take 1 tablet by mouth daily.   montelukast 10 MG tablet Commonly known as:  SINGULAIR Take 10 mg by mouth daily.   traMADol 50 MG tablet Commonly known as:  ULTRAM Take 1 tablet (50 mg total) by mouth every 6 (six) hours as needed for severe pain.   tretinoin 0.025 % cream Commonly known  as:  RETIN-A Apply 1 application topically 2 (two) times a week.   VENTOLIN HFA 108 (90 Base) MCG/ACT inhaler Generic drug:  albuterol Inhale 2 puffs into the lungs every 4 (four) hours as needed for wheezing or shortness of breath. Before exercise      No Known Allergies Follow-up Information    [facility], [place]. Go on 03/03/2018.   Specialty:  General Surgery Why:  Your appointment is 11/12 at 9 am Please arrive 30 minutes prior to your appointment to check in and fill out paperwork. Bring photo ID and insurance information. Contact information: [address] [place] [address] [phone]           The results of significant diagnostics from this hospitalization (including imaging, microbiology, ancillary and laboratory) are listed below for reference.    Significant Diagnostic Studies: Dg Cholangiogram Operative  Result Date: 02/16/2018 CLINICAL DATA:  Intraoperative cholangiogram during laparoscopic cholecystectomy. EXAM: INTRAOPERATIVE CHOLANGIOGRAM FLUOROSCOPY TIME:  18 seconds COMPARISON:  MRCP-02/14/2018; CT abdomen pelvis-02/13/2018 FINDINGS: Intraoperative cholangiographic images of the right upper abdominal quadrant during laparoscopic cholecystectomy are provided for review. Surgical clips overlie the expected location of the gallbladder fossa. Contrast injection demonstrates selective cannulation of the central aspect of the cystic duct. There is passage of contrast through the central aspect of the cystic duct with filling of a non dilated common bile duct. There is passage of contrast though the CBD and into the descending portion of the duodenum. There is minimal reflux of injected contrast into the common hepatic duct and central aspect of the non dilated intrahepatic biliary system. There are no discrete filling defects within the opacified portions of the biliary system to suggest the presence of choledocholithiasis. IMPRESSION: No evidence of choledocholithiasis. Electronically Signed   By: Simonne Come M.D.   On: 02/16/2018 09:50   Ct Abdomen Pelvis W Contrast  Result Date: 02/13/2018 CLINICAL DATA:  Nausea vomiting and abdominal pain EXAM: CT ABDOMEN AND PELVIS WITH CONTRAST TECHNIQUE: Multidetector CT imaging of the abdomen and pelvis was performed using the standard protocol following bolus administration of intravenous contrast. CONTRAST:  ISOVUE-300 IOPAMIDOL (ISOVUE-300) INJECTION 61% COMPARISON:  CT 04/25/2007 FINDINGS: Lower chest: Lung bases demonstrate no acute  consolidation or effusion. The heart size is normal Hepatobiliary: No focal liver abnormality is seen. Multiple calcified stones in the gallbladder. No biliary dilatation. Pancreas: Head, uncinate process and proximal body appear indistinct and enlarged. Moderate peripancreatic edema. No organized fluid collections. Normal enhancement of the pancreas. Spleen: Normal in size without focal abnormality. Adrenals/Urinary Tract: Adrenal glands are unremarkable. Kidneys are normal, without renal calculi, focal lesion, or hydronephrosis. Bladder is unremarkable. Stomach/Bowel: Stomach is within normal limits. Wall thickening of the duodenum and proximal jejunal loops. No dilated small bowel. No colon wall thickening. Negative appendix. Sigmoid colon diverticular disease. Collapsed appearance of the sigmoid colon. Vascular/Lymphatic: Normal enhancement of the portal vessels and splenic vein. Nonaneurysmal aorta. No significant adenopathy Reproductive: Multiple hypodensities at the cervix may reflect nabothian cysts. Tampon in the vagina. Cyst in the right ovary measuring 2 cm. Other: Small free fluid in the pelvis.  No free air. Musculoskeletal: Degenerative changes at L5-S1. No acute or suspicious abnormality. IMPRESSION: 1. Enlarged indistinct appearance of the head, uncinate process and proximal body of the pancreas with surrounding fluid and moderate edema suspect for an acute pancreatitis. No organized fluid collection. No necrosis. 2. Thickened appearance of the duodenum and proximal jejunal bowel loops, either due to reactive  inflammation from the pancreas inflammation or duodenitis/enteritis 3. Gallstones 4. Sigmoid colon diverticular disease without acute inflammation 5. Small free fluid in the pelvis 6. Multiple low-density lesions in the cervix likely representing nabothian cysts, suggest nonemergent pelvic ultrasound correlation Electronically Signed   By: Jasmine Pang M.D.   On: 02/13/2018 20:26   Mr Abdomen  Mrcp Wo Contrast  Result Date: 02/14/2018 CLINICAL DATA:  Epigastric pain, vomiting, suspected acute pancreatitis on CT EXAM: MRI ABDOMEN WITHOUT CONTRAST TECHNIQUE: Multiplanar multisequence MR imaging was performed without the administration of intravenous contrast. COMPARISON:  CT abdomen/pelvis dated 02/13/2018 FINDINGS: Lower chest: Trace bilateral pleural effusions, left greater than right. Hepatobiliary: Liver is within normal limits. Layering small gallstones (series 3/image 40), without gallbladder wall thickening or pericholecystic fluid. No intrahepatic or extrahepatic ductal dilatation. Common duct measures 4 mm. No choledocholithiasis is seen. Pancreas: Peripancreatic fluid/inflammatory changes, suggesting acute pancreatitis. No pancreatic ductal dilatation. No discrete fluid collection/pseudocyst. Spleen:  Within normal limits. Adrenals/Urinary Tract:  Adrenal glands are within normal limits. Kidneys are within normal limits. Perinephric fluid along the left kidney is likely reactive. Stomach/Bowel: Stomach and visualized bowel are grossly unremarkable. Vascular/Lymphatic:  No evidence of abdominal aortic aneurysm. No suspicious abdominal lymphadenopathy. Other: Peripancreatic fluid/edema in the left upper abdomen along the pancreatic tail. Musculoskeletal: No focal osseous lesions. IMPRESSION: Acute pancreatitis.  No drainable fluid collection/pseudocyst. Cholelithiasis, without associated inflammatory changes. No choledocholithiasis is seen. Electronically Signed   By: Charline Bills M.D.   On: 02/14/2018 09:40   Mr 3d Recon At Scanner  Result Date: 02/14/2018 CLINICAL DATA:  Epigastric pain, vomiting, suspected acute pancreatitis on CT EXAM: MRI ABDOMEN WITHOUT CONTRAST TECHNIQUE: Multiplanar multisequence MR imaging was performed without the administration of intravenous contrast. COMPARISON:  CT abdomen/pelvis dated 02/13/2018 FINDINGS: Lower chest: Trace bilateral pleural effusions,  left greater than right. Hepatobiliary: Liver is within normal limits. Layering small gallstones (series 3/image 40), without gallbladder wall thickening or pericholecystic fluid. No intrahepatic or extrahepatic ductal dilatation. Common duct measures 4 mm. No choledocholithiasis is seen. Pancreas: Peripancreatic fluid/inflammatory changes, suggesting acute pancreatitis. No pancreatic ductal dilatation. No discrete fluid collection/pseudocyst. Spleen:  Within normal limits. Adrenals/Urinary Tract:  Adrenal glands are within normal limits. Kidneys are within normal limits. Perinephric fluid along the left kidney is likely reactive. Stomach/Bowel: Stomach and visualized bowel are grossly unremarkable. Vascular/Lymphatic:  No evidence of abdominal aortic aneurysm. No suspicious abdominal lymphadenopathy. Other: Peripancreatic fluid/edema in the left upper abdomen along the pancreatic tail. Musculoskeletal: No focal osseous lesions. IMPRESSION: Acute pancreatitis.  No drainable fluid collection/pseudocyst. Cholelithiasis, without associated inflammatory changes. No choledocholithiasis is seen. Electronically Signed   By: Charline Bills M.D.   On: 02/14/2018 09:40    Microbiology: Recent Results (from the past 240 hour(s))  Urine culture     Status: None   Collection Time: 02/13/18  5:15 PM  Result Value Ref Range Status   Specimen Description   Final    URINE, CLEAN CATCH Performed at [facility], [address] [place], [place] [address]    Special Requests   Final    NONE Performed at [facility], [address] [place], [place] [address]    Culture   Final    NO GROWTH Performed at [facility], [address], [place], [place] [address]    Report Status 02/15/2018 FINAL  Final  MRSA PCR Screening     Status: None   Collection Time: 02/15/18  8:03 PM  Result Value Ref Range Status   MRSA by PCR NEGATIVE NEGATIVE Final    Comment:        The GeneXpert MRSA  Assay (FDA approved for NASAL specimens only), is one component of a comprehensive MRSA colonization surveillance program. It is not intended to diagnose MRSA infection nor to guide or monitor treatment for MRSA infections. Performed at [facility], [address] [place], [place] [address]      Labs: Basic Metabolic Panel: Recent Labs  Lab 02/13/18 1801 02/14/18 0400 02/15/18 0358 02/16/18 0413 02/17/18 0508  NA 136 137 138 139 139  K 3.7 3.6 3.3* 3.6 4.0  CL 103 106 107 107 108  CO2 24 23 23 23 25   GLUCOSE 103* 127* 81 98 131*  BUN 13 8 7 6 7   CREATININE 0.71 0.64 0.66 0.61 0.53  CALCIUM 8.5* 7.2* 7.5* 7.9* 8.0*   Liver Function Tests: Recent Labs  Lab 02/13/18 1801 02/14/18 0400 02/15/18 0358 02/16/18 0413 02/17/18 0508  AST 19 15 13* 11* 57*  ALT 16 12 10 10  38  ALKPHOS 43 37* 37* 37* 99  BILITOT 1.1 1.6* 1.5* 1.3* 0.9  PROT 7.2 5.8* 5.7* 5.9* 6.1*  ALBUMIN 4.1 3.1* 2.9* 2.7* 2.8*   Recent Labs  Lab 02/13/18 1801 02/15/18 0358 02/16/18 0413  LIPASE 109* 38 27   No results for input(s): AMMONIA in the last 168 hours. CBC: Recent Labs  Lab 02/13/18 1801 02/14/18 0400 02/16/18 0413 02/17/18 0508  WBC 17.4* 19.7* 20.4* 20.7*  NEUTROABS 15.3* 17.7*  --  18.3*  HGB 12.9 11.1* 10.0* 10.0*  HCT 39.3 34.3* 31.7* 30.3*  MCV 87.5 88.6 90.6 87.8  PLT 309 270 250 328   Cardiac Enzymes: No results for input(s): CKTOTAL, CKMB, CKMBINDEX, TROPONINI in the last 168 hours. BNP: BNP (last 3 results) No results for input(s): BNP in the last 8760 hours.  ProBNP (last 3 results) No results for input(s): PROBNP in the last 8760 hours.  CBG: No results for input(s): GLUCAP in the last 168 hours.     Signed:  Rhetta Mura MD   [facility] 02/17/2018, 12:02 PM

## 2018-02-17 NOTE — Anesthesia Postprocedure Evaluation (Signed)
Anesthesia Post Note  Patient: [name]  Procedure(s) Performed: LAPAROSCOPIC CHOLECYSTECTOMY WITH INTRAOPERATIVE CHOLANGIOGRAM (N/A Abdomen)     Patient location during evaluation: PACU Anesthesia Type: General Level of consciousness: awake and alert Pain management: pain level controlled Vital Signs Assessment: post-procedure vital signs reviewed and stable Respiratory status: spontaneous breathing, nonlabored ventilation and respiratory function stable Cardiovascular status: blood pressure returned to baseline and stable Postop Assessment: no apparent nausea or vomiting Anesthetic complications: no    Last Vitals:  Vitals:   02/16/18 2207 02/17/18 0510  BP: 122/78 116/73  Pulse: 73 72  Resp: 18 18  Temp: 37.2 C 36.9 C  SpO2: 96% 97%    Last Pain:  Vitals:   02/17/18 0510  TempSrc: Oral  PainSc:                  Beryle Lathe

## 2018-02-17 NOTE — Progress Notes (Signed)
Discharge instructions discussed with patient and family, verbalized agreement and understanding 

## 2018-03-03 ENCOUNTER — Inpatient Hospital Stay (HOSPITAL_BASED_OUTPATIENT_CLINIC_OR_DEPARTMENT_OTHER)
Admission: EM | Admit: 2018-03-03 | Discharge: 2018-03-07 | DRG: 392 | Disposition: A | Discharge status: Home / Self Care | Payer: BLUE CROSS/BLUE SHIELD | Attending: Internal Medicine | Admitting: Internal Medicine

## 2018-03-03 ENCOUNTER — Emergency Department (HOSPITAL_COMMUNITY)
Admission: EM | Admit: 2018-03-03 | Discharge: 2018-03-03 | Discharge status: Against Medical Advice | Payer: BLUE CROSS/BLUE SHIELD | Attending: Emergency Medicine | Admitting: Emergency Medicine

## 2018-03-03 ENCOUNTER — Other Ambulatory Visit: Payer: Self-pay

## 2018-03-03 ENCOUNTER — Encounter (HOSPITAL_BASED_OUTPATIENT_CLINIC_OR_DEPARTMENT_OTHER): Payer: Self-pay

## 2018-03-03 ENCOUNTER — Other Ambulatory Visit: Payer: Self-pay | Admitting: Student

## 2018-03-03 ENCOUNTER — Ambulatory Visit
Admission: RE | Admit: 2018-03-03 | Discharge: 2018-03-03 | Disposition: A | Discharge status: Home / Self Care | Payer: BLUE CROSS/BLUE SHIELD | Source: Ambulatory Visit | Attending: Student | Admitting: Student

## 2018-03-03 DIAGNOSIS — R1013 Epigastric pain: Secondary | ICD-10-CM

## 2018-03-03 DIAGNOSIS — D72829 Elevated white blood cell count, unspecified: Secondary | ICD-10-CM | POA: Diagnosis present

## 2018-03-03 DIAGNOSIS — R1110 Vomiting, unspecified: Secondary | ICD-10-CM | POA: Diagnosis not present

## 2018-03-03 DIAGNOSIS — Z8619 Personal history of other infectious and parasitic diseases: Secondary | ICD-10-CM

## 2018-03-03 DIAGNOSIS — Z79899 Other long term (current) drug therapy: Secondary | ICD-10-CM

## 2018-03-03 DIAGNOSIS — R7989 Other specified abnormal findings of blood chemistry: Secondary | ICD-10-CM | POA: Diagnosis not present

## 2018-03-03 DIAGNOSIS — K7689 Other specified diseases of liver: Secondary | ICD-10-CM | POA: Diagnosis not present

## 2018-03-03 DIAGNOSIS — J45909 Unspecified asthma, uncomplicated: Secondary | ICD-10-CM | POA: Diagnosis not present

## 2018-03-03 DIAGNOSIS — R945 Abnormal results of liver function studies: Secondary | ICD-10-CM | POA: Diagnosis not present

## 2018-03-03 DIAGNOSIS — R935 Abnormal findings on diagnostic imaging of other abdominal regions, including retroperitoneum: Secondary | ICD-10-CM | POA: Diagnosis not present

## 2018-03-03 DIAGNOSIS — Z5321 Procedure and treatment not carried out due to patient leaving prior to being seen by health care provider: Secondary | ICD-10-CM | POA: Diagnosis not present

## 2018-03-03 DIAGNOSIS — R109 Unspecified abdominal pain: Secondary | ICD-10-CM | POA: Diagnosis not present

## 2018-03-03 DIAGNOSIS — R933 Abnormal findings on diagnostic imaging of other parts of digestive tract: Secondary | ICD-10-CM

## 2018-03-03 DIAGNOSIS — R112 Nausea with vomiting, unspecified: Secondary | ICD-10-CM | POA: Diagnosis not present

## 2018-03-03 DIAGNOSIS — D649 Anemia, unspecified: Secondary | ICD-10-CM | POA: Diagnosis not present

## 2018-03-03 DIAGNOSIS — R1010 Upper abdominal pain, unspecified: Secondary | ICD-10-CM | POA: Diagnosis not present

## 2018-03-03 LAB — CBC
HCT: 38.7 % (ref 36.0–46.0)
Hemoglobin: 12.6 g/dL (ref 12.0–15.0)
MCH: 28.3 pg (ref 26.0–34.0)
MCHC: 32.6 g/dL (ref 30.0–36.0)
MCV: 87 fL (ref 80.0–100.0)
Platelets: 400 10*3/uL (ref 150–400)
RBC: 4.45 MIL/uL (ref 3.87–5.11)
RDW: 13.3 % (ref 11.5–15.5)
WBC: 27.9 10*3/uL — ABNORMAL HIGH (ref 4.0–10.5)
nRBC: 0 % (ref 0.0–0.2)

## 2018-03-03 LAB — COMPREHENSIVE METABOLIC PANEL
ALT: 205 U/L — ABNORMAL HIGH (ref 0–44)
AST: 368 U/L — ABNORMAL HIGH (ref 15–41)
Albumin: 3.9 g/dL (ref 3.5–5.0)
Alkaline Phosphatase: 121 U/L (ref 38–126)
Anion gap: 11 (ref 5–15)
BUN: 10 mg/dL (ref 6–20)
CO2: 24 mmol/L (ref 22–32)
Calcium: 9 mg/dL (ref 8.9–10.3)
Chloride: 101 mmol/L (ref 98–111)
Creatinine, Ser: 0.58 mg/dL (ref 0.44–1.00)
GFR calc Af Amer: >60 mL/min (ref >60)
GFR calc non Af Amer: >60 mL/min (ref >60)
Glucose, Bld: 143 mg/dL — ABNORMAL HIGH (ref 70–99)
Potassium: 3.5 mmol/L (ref 3.5–5.1)
Sodium: 136 mmol/L (ref 135–145)
Total Bilirubin: 4.1 mg/dL — ABNORMAL HIGH (ref 0.3–1.2)
Total Protein: 7.5 g/dL (ref 6.5–8.1)

## 2018-03-03 LAB — LIPASE, BLOOD: Lipase: 24 U/L (ref 11–51)

## 2018-03-03 MED ORDER — IOPAMIDOL (ISOVUE-300) INJECTION 61%
100.0000 mL | Freq: Once | INTRAVENOUS | Status: AC | PRN
  Administered 2018-03-03: 100 mL via INTRAVENOUS

## 2018-03-03 MED ORDER — ALUM & MAG HYDROXIDE-SIMETH 200-200-20 MG/5ML PO SUSP
15.0000 mL | Freq: Once | ORAL | Status: AC
  Administered 2018-03-03: 15 mL via ORAL
  Filled 2018-03-03: qty 30

## 2018-03-03 MED ORDER — SODIUM CHLORIDE 0.9 % IV BOLUS
1000.0000 mL | Freq: Once | INTRAVENOUS | Status: AC
  Administered 2018-03-03: 1000 mL via INTRAVENOUS

## 2018-03-03 MED ORDER — ONDANSETRON HCL 4 MG/2ML IJ SOLN
4.0000 mg | Freq: Once | INTRAMUSCULAR | Status: AC
  Administered 2018-03-03: 4 mg via INTRAVENOUS
  Filled 2018-03-03: qty 2

## 2018-03-03 NOTE — ED Triage Notes (Signed)
Pt c/o abd pain, n/v x 2 days -was seen today for post op GB surgery from 10/28-had CT scan and labs today-pt NAD-steady gait

## 2018-03-03 NOTE — ED Notes (Signed)
ED Provider at bedside. 

## 2018-03-03 NOTE — ED Notes (Signed)
[facility] notified (Tara) - patient ready for transport 

## 2018-03-03 NOTE — ED Notes (Signed)
ED TO INPATIENT HANDOFF REPORT  Name/Age/Gender [name] 47 y.o. female  Code Status Code Status History    Date Active Date Inactive Code Status Order ID Comments User Context   02/13/2018 2341 02/17/2018 1628 Full Code [id number]  Rise Patience, MD Inpatient      Home/SNF/Other Home  Chief Complaint abdominal pain and vomiting  Level of Care/Admitting Diagnosis ED Disposition    ED Disposition Condition Comment   [facility] Area: [facility] [[id number]]  Level of Care: Med-Surg [16]  Diagnosis: Abdominal pain [973532]  Admitting Physician: Rise Patience [[id number]]  Attending Physician: Rise Patience [phone]  PT Class (Do Not Modify): Observation [104]  PT Acc Code (Do Not Modify): Observation [10022]       Medical History Past Medical History:  Diagnosis Date  . Allergic rhinitis   . Asthma   . Gallstones 1/09   on CT  . IBS (irritable bowel syndrome)   . Ischemic colitis ([place]) 1/09  . Jejunal intussusception ([place]) 1/09   on CT    Allergies No Known Allergies  IV Location/Drains/Wounds Patient Lines/Drains/Airways Status   Active Line/Drains/Airways    Name:   Placement date:   Placement time:   Site:   Days:   Peripheral IV 03/03/18 Right Forearm   03/03/18    2131    Forearm   less than 1   Incision - 4 Ports Abdomen 1: Umbilicus 2: Superior;Umbilicus 3: Right;Superior;Medial;Umbilicus 4: Umbilicus;Right;Lateral   02/16/18    0920     15          Labs/Imaging Results for orders placed or performed during the hospital encounter of 03/03/18 (from the past 48 hour(s))  Lipase, blood     Status: None   Collection Time: 03/03/18  9:30 PM  Result Value Ref Range   Lipase 24 11 - 51 U/L    Comment: Performed at [facility], [address] [place], [place] [address]  Comprehensive metabolic panel     Status: Abnormal   Collection Time: 03/03/18  9:30 PM  Result Value Ref Range   Sodium 136 135 -  145 mmol/L   Potassium 3.5 3.5 - 5.1 mmol/L   Chloride 101 98 - 111 mmol/L   CO2 24 22 - 32 mmol/L   Glucose, Bld 143 (H) 70 - 99 mg/dL   BUN 10 6 - 20 mg/dL   Creatinine, Ser 0.58 0.44 - 1.00 mg/dL   Calcium 9.0 8.9 - 10.3 mg/dL   Total Protein 7.5 6.5 - 8.1 g/dL   Albumin 3.9 3.5 - 5.0 g/dL   AST 368 (H) 15 - 41 U/L   ALT 205 (H) 0 - 44 U/L   Alkaline Phosphatase 121 38 - 126 U/L   Total Bilirubin 4.1 (H) 0.3 - 1.2 mg/dL   GFR calc non Af Amer >60 >60 mL/min   GFR calc Af Amer >60 >60 mL/min    Comment: (NOTE) The eGFR has been calculated using the CKD EPI equation. This calculation has not been validated in all clinical situations. eGFR's persistently <60 mL/min signify possible Chronic Kidney Disease.    Anion gap 11 5 - 15    Comment: Performed at [facility], [address] [place], [place] [address]  CBC     Status: Abnormal   Collection Time: 03/03/18  9:30 PM  Result Value Ref Range   WBC 27.9 (H) 4.0 - 10.5 K/uL   RBC  4.45 3.87 - 5.11 MIL/uL   Hemoglobin 12.6 12.0 - 15.0 g/dL   HCT 38.7 36.0 - 46.0 %   MCV 87.0 80.0 - 100.0 fL   MCH 28.3 26.0 - 34.0 pg   MCHC 32.6 30.0 - 36.0 g/dL   RDW 13.3 11.5 - 15.5 %   Platelets 400 150 - 400 K/uL   nRBC 0.0 0.0 - 0.2 %    Comment: Performed at [facility], [address] [place], [place] [address]   Ct Abdomen Pelvis W Contrast  Result Date: 03/03/2018 CLINICAL DATA:  Mid abdominal pain with nausea and vomiting. History of laparoscopic cholecystectomy on 02/16/2018 for cholelithiasis and biliary pancreatitis. EXAM: CT ABDOMEN AND PELVIS WITH CONTRAST TECHNIQUE: Multidetector CT imaging of the abdomen and pelvis was performed using the standard protocol following bolus administration of intravenous contrast. CONTRAST:  134m ISOVUE-300 IOPAMIDOL (ISOVUE-300) INJECTION 61% COMPARISON:  02/13/2018 FINDINGS: Lower chest: No acute abnormality. Hepatobiliary: No focal liver abnormality is seen. Status post  cholecystectomy. No biliary dilatation. No abnormal fluid collections are identified post cholecystectomy. Cystic duct clips are normally positioned and show no obvious evidence of migration by CT. Pancreas: Pancreatic inflammation has resolved since the prior CT. No mass or pancreatic ductal dilatation. No evidence of pseudocyst. Spleen: Normal in size without focal abnormality. Adrenals/Urinary Tract: Adrenal glands are unremarkable. Kidneys are normal, without renal calculi, focal lesion, or hydronephrosis. Bladder is unremarkable. Stomach/Bowel: No evidence of bowel obstruction, ileus or inflammation. No free air. No bowel lesions identified. Vascular/Lymphatic: No significant vascular findings are present. No enlarged abdominal or pelvic lymph nodes. Reproductive: Uterus and bilateral adnexa are unremarkable. Other: No abdominal wall hernia or abnormality. No focal abscess or ascites. Musculoskeletal: Stable degenerative disc disease at L5-S1. IMPRESSION: No acute findings or complications evident after recent cholecystectomy. Previously noted pancreatic inflammation has resolved by imaging. Electronically Signed   By: GAletta EdouardM.D.   On: 03/03/2018 16:29   None  Pending Labs Unresulted Labs (From admission, onward)    Start     Ordered   03/03/18 2123  Urinalysis, Routine w reflex microscopic  ONCE - STAT,   STAT     03/03/18 2123   03/03/18 2123  Pregnancy, urine  Once,   STAT     03/03/18 2123          Vitals/Pain Today's Vitals   03/03/18 2200 03/03/18 2206 03/03/18 2230 03/03/18 2300  BP: 129/76  125/78 111/73  Pulse: 65  78 80  Resp: 18     Temp:      TempSrc:      SpO2: 98%  98% 98%  Weight:      Height:      PainSc:  6       Isolation Precautions No active isolations  Medications Medications  sodium chloride 0.9 % bolus 1,000 mL (0 mLs Intravenous Stopped 03/03/18 2216)  ondansetron (ZOFRAN) injection 4 mg (4 mg Intravenous Given 03/03/18 2146)  alum & mag  hydroxide-simeth (MAALOX/MYLANTA) 200-200-20 MG/5ML suspension 15 mL (15 mLs Oral Given 03/03/18 2147)    Mobility walks

## 2018-03-03 NOTE — ED Notes (Signed)
Patient aware that we need urine sample for testing, unable at this time. Pt given instruction on providing urine sample when able to do so.   

## 2018-03-03 NOTE — ED Provider Notes (Signed)
[facility] EMERGENCY DEPARTMENT Provider Note   CSN: [id number] Arrival date & time: 03/03/18  2104     History   Chief Complaint Chief Complaint  Patient presents with  . Abdominal Pain    HPI [name] is a 47 y.o. female.  47 yo F with a chief complaint of epigastric and right upper quadrant abdominal pain nausea and vomiting.  This is been off and on for the past week.  She saw her general surgeon's office this morning for a routine follow-up from her cholecystectomy.  This was done about 3 weeks ago.  She thinks the pain she is been having feels similar to when she was diagnosed with gallstone pancreatitis.  She had lab work drawn and a CT scan.  The lab work showed a persistent leukocytosis but the family is unsure if the rest of the lab work.  CT scan was read as normal.  Patient has had continued symptoms and was concerned so came into the emergency department.  She has tried a couple of Vicodin tablets with some improvement.  She thinks it got worse today when she ate some pineapple.  She had been eating the same thing for the past 4 days because she was scared to try anything new.  The history is provided by the patient and the spouse.  Abdominal Pain   This is a recurrent problem. The current episode started yesterday. The problem occurs constantly. The problem has been gradually worsening. The pain is associated with eating. The pain is located in the epigastric region. The quality of the pain is sharp and shooting. The pain is at a severity of 8/10. The pain is moderate. Associated symptoms include nausea and vomiting. Pertinent negatives include fever, dysuria, headaches, arthralgias and myalgias. The symptoms are aggravated by eating. Nothing relieves the symptoms. Past workup includes CT scan and surgery. Her past medical history is significant for gallstones.    Past Medical History:  Diagnosis Date  . Allergic rhinitis   . Asthma   . Gallstones 1/09   on CT  . IBS (irritable bowel syndrome)   . Ischemic colitis ([facility]) 1/09  . Jejunal intussusception ([facility]) 1/09   on CT    Patient Active Problem List   Diagnosis Date Noted  . Abdominal pain 03/03/2018  . Gallstone pancreatitis 02/13/2018  . Gallstones 02/13/2018  . Acute pancreatitis 02/13/2018  . Lactose intolerance 07/10/2013  . Irritable bowel syndrome 06/25/2007  . ALLERGIC RHINITIS CAUSE UNSPECIFIED 05/13/2007  . Intussusception ([facility]) 05/13/2007  . CALCU GALLBLADD W/ACUT CHOLCYST W/O MENTION OBST 05/13/2007  . FLATULENCE ERUCTATION AND GAS PAIN 05/13/2007    Past Surgical History:  Procedure Laterality Date  . CHOLECYSTECTOMY N/A 02/16/2018   Procedure: LAPAROSCOPIC CHOLECYSTECTOMY WITH INTRAOPERATIVE CHOLANGIOGRAM;  Surgeon: Darnell Level, MD;  Location: [facility] ORS;  Service: General;  Laterality: N/A;  . COLONOSCOPY  1/09     OB History   None      Home Medications    Prior to Admission medications   Medication Sig Start Date End Date Taking? Authorizing Provider  ACZONE 7.5 % GEL  05/29/16   [provider]  calcium-vitamin D (OSCAL WITH D) 500-200 MG-UNIT tablet Take 2 tablets by mouth daily with breakfast.    [provider]  cetirizine (ZYRTEC) 10 MG tablet Take 10 mg by mouth daily.    [provider]  cyclobenzaprine (FLEXERIL) 10 MG tablet Take 10 mg by mouth 3 (three) times daily as needed for muscle spasms.  04/01/13   [provider]  diclofenac (VOLTAREN) 75 MG EC tablet Take 75 mg by mouth 2 (two) times daily as needed for mild pain.     [provider]  fexofenadine (ALLEGRA) 180 MG tablet Take 180 mg by mouth daily.    [provider]  GLUCOSAMINE-CHONDROITIN PO Take 1 tablet by mouth daily.    [provider]  HYDROcodone-acetaminophen (NORCO/VICODIN) 5-325 MG tablet Take 1-2 tablets by mouth every 4 (four) hours as needed for moderate pain. 02/17/18   Rhetta Mura, MD  montelukast  (SINGULAIR) 10 MG tablet Take 10 mg by mouth daily.  06/26/13   [provider]  Probiotic Product (ALIGN) 4 MG CAPS Take 1 capsule by mouth daily.    [provider]  traMADol (ULTRAM) 50 MG tablet Take 1 tablet (50 mg total) by mouth every 6 (six) hours as needed for severe pain. 02/17/18   Meuth, Brooke A, PA-C  tretinoin (RETIN-A) 0.025 % cream Apply 1 application topically 2 (two) times a week.  05/30/16   [provider]  VENTOLIN HFA 108 (90 BASE) MCG/ACT inhaler Inhale 2 puffs into the lungs every 4 (four) hours as needed for wheezing or shortness of breath. Before exercise 04/24/13   [provider]    Family History Family History  Problem Relation Age of Onset  . Diabetes Mellitus II Neg Hx   . Hypertension Neg Hx     Social History Social History   Tobacco Use  . Smoking status: Never Smoker  . Smokeless tobacco: Never Used  Substance Use Topics  . Alcohol use: Yes    Comment: occasional  . Drug use: No     Allergies   Patient has no known allergies.   Review of Systems Review of Systems  Constitutional: Negative for chills and fever.  HENT: Negative for congestion and rhinorrhea.   Eyes: Negative for redness and visual disturbance.  Respiratory: Negative for shortness of breath and wheezing.   Cardiovascular: Negative for chest pain and palpitations.  Gastrointestinal: Positive for abdominal pain, nausea and vomiting.  Genitourinary: Negative for dysuria and urgency.  Musculoskeletal: Negative for arthralgias and myalgias.  Skin: Negative for pallor and wound.  Neurological: Negative for dizziness and headaches.     Physical Exam Updated Vital Signs BP 125/78   Pulse 78   Temp 98.3 F (36.8 C) (Oral)   Resp 18   Ht 5\' 5"  (1.651 m)   Wt 59.8 kg   LMP 02/08/2018 (Exact Date)   SpO2 98%   BMI 21.93 kg/m   Physical Exam  Constitutional: She is oriented to person, place, and time. She appears well-developed and  well-nourished. No distress.  HENT:  Head: Normocephalic and atraumatic.  Eyes: Pupils are equal, round, and reactive to light. EOM are normal.  Neck: Normal range of motion. Neck supple.  Cardiovascular: Normal rate and regular rhythm. Exam reveals no gallop and no friction rub.  No murmur heard. Pulmonary/Chest: Effort normal. She has no wheezes. She has no rales.  Abdominal: Soft. She exhibits no distension. There is tenderness in the right upper quadrant and epigastric area.  laparoscopy wounds well healed.  Musculoskeletal: She exhibits no edema or tenderness.  Neurological: She is alert and oriented to person, place, and time.  Skin: Skin is warm and dry. She is not diaphoretic.  Psychiatric: She has a normal mood and affect. Her behavior is normal.  Nursing note and vitals reviewed.    ED Treatments / Results  Labs (all labs ordered are listed, but only abnormal results are displayed) Labs Reviewed  COMPREHENSIVE METABOLIC PANEL - Abnormal; Notable for the following components:      Result Value   Glucose, Bld 143 (*)    AST 368 (*)    ALT 205 (*)    Total Bilirubin 4.1 (*)    All other components within normal limits  CBC - Abnormal; Notable for the following components:   WBC 27.9 (*)    All other components within normal limits  LIPASE, BLOOD  URINALYSIS, ROUTINE W REFLEX MICROSCOPIC  PREGNANCY, URINE    EKG None  Radiology Ct Abdomen Pelvis W Contrast  Result Date: 03/03/2018 CLINICAL DATA:  Mid abdominal pain with nausea and vomiting. History of laparoscopic cholecystectomy on 02/16/2018 for cholelithiasis and biliary pancreatitis. EXAM: CT ABDOMEN AND PELVIS WITH CONTRAST TECHNIQUE: Multidetector CT imaging of the abdomen and pelvis was performed using the standard protocol following bolus administration of intravenous contrast. CONTRAST:  ISOVUE-300 IOPAMIDOL (ISOVUE-300) INJECTION 61% COMPARISON:  02/13/2018 FINDINGS: Lower chest: No acute abnormality.  Hepatobiliary: No focal liver abnormality is seen. Status post cholecystectomy. No biliary dilatation. No abnormal fluid collections are identified post cholecystectomy. Cystic duct clips are normally positioned and show no obvious evidence of migration by CT. Pancreas: Pancreatic inflammation has resolved since the prior CT. No mass or pancreatic ductal dilatation. No evidence of pseudocyst. Spleen: Normal in size without focal abnormality. Adrenals/Urinary Tract: Adrenal glands are unremarkable. Kidneys are normal, without renal calculi, focal lesion, or hydronephrosis. Bladder is unremarkable. Stomach/Bowel: No evidence of bowel obstruction, ileus or inflammation. No free air. No bowel lesions identified. Vascular/Lymphatic: No significant vascular findings are present. No enlarged abdominal or pelvic lymph nodes. Reproductive: Uterus and bilateral adnexa are unremarkable. Other: No abdominal wall hernia or abnormality. No focal abscess or ascites. Musculoskeletal: Stable degenerative disc disease at L5-S1. IMPRESSION: No acute findings or complications evident after recent cholecystectomy. Previously noted pancreatic inflammation has resolved by imaging. Electronically Signed   By: Irish Lack M.D.   On: 03/03/2018 16:29    Procedures Procedures (including critical care time)  Medications Ordered in ED Medications  sodium chloride 0.9 % bolus 1,000 mL (0 mLs Intravenous Stopped 03/03/18 2216)  ondansetron (ZOFRAN) injection 4 mg (4 mg Intravenous Given 03/03/18 2146)  alum & mag hydroxide-simeth (MAALOX/MYLANTA) 200-200-20 MG/5ML suspension 15 mL (15 mLs Oral Given 03/03/18 2147)     Initial Impression / Assessment and Plan / ED Course  I have reviewed the triage vital signs and the nursing notes.  Pertinent labs & imaging results that were available during my care of the patient were reviewed by me and considered in my medical decision making (see chart for details).     47 yo F with a  chief complaint of epigastric and right upper quadrant abdominal pain.  Patient is about 3 weeks postoperative from a cholecystectomy.  It was after she was found to have gallstone pancreatitis.  She was seen as an outpatient earlier today and had a CT scan that was read as negative for pancreatitis as well as no intra-abdominal abscess.  She has had a persistent leukocytosis that is being tracked as an outpatient.  She is here because she had intractable epigastric pain and nausea and vomiting.  The patient has now elevated liver function tests from when she was just in the hospital.  That with an elevated bilirubin is concerning for a retained common bile duct stone.  I attempted to discuss the  case [facility] but unfortunately their phone service has not been working for the past 45 minutes.  Will discuss with the hospitalist for likely observation and MRCP.  The patients results and plan were reviewed and discussed.   Any x-rays performed were independently reviewed by myself.   Differential diagnosis were considered with the presenting HPI.  Medications  sodium chloride 0.9 % bolus 1,000 mL (0 mLs Intravenous Stopped 03/03/18 2216)  ondansetron (ZOFRAN) injection 4 mg (4 mg Intravenous Given 03/03/18 2146)  alum & mag hydroxide-simeth (MAALOX/MYLANTA) 200-200-20 MG/5ML suspension 15 mL (15 mLs Oral Given 03/03/18 2147)    Vitals:   03/03/18 2113 03/03/18 2200 03/03/18 2230  BP: 113/80 129/76 125/78  Pulse: 88 65 78  Resp: 20 18   Temp: 98.3 F (36.8 C)    TempSrc: Oral    SpO2: 99% 98% 98%  Weight: 59.8 kg    Height: 5\' 5"  (1.651 m)      Final diagnoses:  Epigastric abdominal pain  LFT elevation    Admission/ observation were discussed with the admitting physician, patient and/or family and they are comfortable with the plan.    Final Clinical Impressions(s) / ED Diagnoses   Final diagnoses:  Epigastric abdominal pain  LFT elevation    ED Discharge  Orders    None       Melene PlanFloyd, Orlandus Borowski, DO 03/03/18 2306

## 2018-03-04 ENCOUNTER — Observation Stay (HOSPITAL_COMMUNITY): Payer: BLUE CROSS/BLUE SHIELD

## 2018-03-04 ENCOUNTER — Encounter (HOSPITAL_COMMUNITY): Payer: Self-pay | Admitting: Internal Medicine

## 2018-03-04 DIAGNOSIS — R1013 Epigastric pain: Secondary | ICD-10-CM

## 2018-03-04 DIAGNOSIS — R945 Abnormal results of liver function studies: Secondary | ICD-10-CM | POA: Diagnosis not present

## 2018-03-04 DIAGNOSIS — R1010 Upper abdominal pain, unspecified: Secondary | ICD-10-CM | POA: Diagnosis not present

## 2018-03-04 DIAGNOSIS — R935 Abnormal findings on diagnostic imaging of other abdominal regions, including retroperitoneum: Secondary | ICD-10-CM | POA: Diagnosis not present

## 2018-03-04 DIAGNOSIS — R7989 Other specified abnormal findings of blood chemistry: Secondary | ICD-10-CM

## 2018-03-04 LAB — IRON AND TIBC
Iron: 31 ug/dL (ref 28–170)
Saturation Ratios: 8 % — ABNORMAL LOW (ref 10.4–31.8)
TIBC: 379 ug/dL (ref 250–450)
UIBC: 348 ug/dL

## 2018-03-04 LAB — BASIC METABOLIC PANEL
Anion gap: 7 (ref 5–15)
BUN: 8 mg/dL (ref 6–20)
CO2: 25 mmol/L (ref 22–32)
Calcium: 8.3 mg/dL — ABNORMAL LOW (ref 8.9–10.3)
Chloride: 108 mmol/L (ref 98–111)
Creatinine, Ser: 0.7 mg/dL (ref 0.44–1.00)
GFR calc Af Amer: >60 mL/min (ref >60)
GFR calc non Af Amer: >60 mL/min (ref >60)
Glucose, Bld: 114 mg/dL — ABNORMAL HIGH (ref 70–99)
Potassium: 3.6 mmol/L (ref 3.5–5.1)
Sodium: 140 mmol/L (ref 135–145)

## 2018-03-04 LAB — HEPATIC FUNCTION PANEL
ALT: 219 U/L — ABNORMAL HIGH (ref 0–44)
AST: 350 U/L — ABNORMAL HIGH (ref 15–41)
Albumin: 3.4 g/dL — ABNORMAL LOW (ref 3.5–5.0)
Alkaline Phosphatase: 116 U/L (ref 38–126)
Bilirubin, Direct: 2.5 mg/dL — ABNORMAL HIGH (ref 0.0–0.2)
Indirect Bilirubin: 1.9 mg/dL — ABNORMAL HIGH (ref 0.3–0.9)
Total Bilirubin: 4.4 mg/dL — ABNORMAL HIGH (ref 0.3–1.2)
Total Protein: 6.3 g/dL — ABNORMAL LOW (ref 6.5–8.1)

## 2018-03-04 LAB — CBC
HCT: 33.4 % — ABNORMAL LOW (ref 36.0–46.0)
Hemoglobin: 10.7 g/dL — ABNORMAL LOW (ref 12.0–15.0)
MCH: 28 pg (ref 26.0–34.0)
MCHC: 32 g/dL (ref 30.0–36.0)
MCV: 87.4 fL (ref 80.0–100.0)
Platelets: 356 10*3/uL (ref 150–400)
RBC: 3.82 MIL/uL — ABNORMAL LOW (ref 3.87–5.11)
RDW: 13.4 % (ref 11.5–15.5)
WBC: 17.6 10*3/uL — ABNORMAL HIGH (ref 4.0–10.5)
nRBC: 0 % (ref 0.0–0.2)

## 2018-03-04 LAB — COMPREHENSIVE METABOLIC PANEL
ALT: 247 U/L — ABNORMAL HIGH (ref 0–44)
AST: 335 U/L — ABNORMAL HIGH (ref 15–41)
Albumin: 3.4 g/dL — ABNORMAL LOW (ref 3.5–5.0)
Alkaline Phosphatase: 142 U/L — ABNORMAL HIGH (ref 38–126)
Anion gap: 10 (ref 5–15)
BUN: 8 mg/dL (ref 6–20)
CO2: 22 mmol/L (ref 22–32)
Calcium: 8.1 mg/dL — ABNORMAL LOW (ref 8.9–10.3)
Chloride: 106 mmol/L (ref 98–111)
Creatinine, Ser: 0.63 mg/dL (ref 0.44–1.00)
GFR calc Af Amer: >60 mL/min (ref >60)
GFR calc non Af Amer: >60 mL/min (ref >60)
Glucose, Bld: 109 mg/dL — ABNORMAL HIGH (ref 70–99)
Potassium: 3.5 mmol/L (ref 3.5–5.1)
Sodium: 138 mmol/L (ref 135–145)
Total Bilirubin: 6.4 mg/dL — ABNORMAL HIGH (ref 0.3–1.2)
Total Protein: 6.5 g/dL (ref 6.5–8.1)

## 2018-03-04 LAB — URINALYSIS, MICROSCOPIC (REFLEX)

## 2018-03-04 LAB — PREGNANCY, URINE: Preg Test, Ur: NEGATIVE

## 2018-03-04 LAB — LIPASE, BLOOD: Lipase: 23 U/L (ref 11–51)

## 2018-03-04 LAB — URINALYSIS, ROUTINE W REFLEX MICROSCOPIC
Glucose, UA: NEGATIVE mg/dL
Ketones, ur: NEGATIVE mg/dL
Leukocytes, UA: NEGATIVE
Nitrite: NEGATIVE
Protein, ur: NEGATIVE mg/dL
Specific Gravity, Urine: 1.015 (ref 1.005–1.030)
pH: 6.5 (ref 5.0–8.0)

## 2018-03-04 MED ORDER — ONDANSETRON HCL 4 MG PO TABS: 4.0000 mg | ORAL_TABLET | Freq: Four times a day (QID) | ORAL | Status: DC | PRN

## 2018-03-04 MED ORDER — PIPERACILLIN-TAZOBACTAM 3.375 G IVPB
3.3750 g | Freq: Three times a day (TID) | INTRAVENOUS | Status: DC
  Administered 2018-03-05 – 2018-03-07 (×7): 3.375 g via INTRAVENOUS
  Filled 2018-03-04 (×7): qty 50

## 2018-03-04 MED ORDER — GADOBUTROL 1 MMOL/ML IV SOLN
6.0000 mL | Freq: Once | INTRAVENOUS | Status: AC | PRN
  Administered 2018-03-04: 6 mL via INTRAVENOUS

## 2018-03-04 MED ORDER — MORPHINE SULFATE (PF) 2 MG/ML IV SOLN
1.0000 mg | INTRAVENOUS | Status: DC | PRN
  Administered 2018-03-04: 1 mg via INTRAVENOUS
  Filled 2018-03-04: qty 1

## 2018-03-04 MED ORDER — ACETAMINOPHEN 650 MG RE SUPP: 650.0000 mg | Freq: Four times a day (QID) | RECTAL | Status: DC | PRN

## 2018-03-04 MED ORDER — SODIUM CHLORIDE 0.9 % IV SOLN
INTRAVENOUS | Status: AC
  Administered 2018-03-04 (×2): via INTRAVENOUS

## 2018-03-04 MED ORDER — ONDANSETRON HCL 4 MG/2ML IJ SOLN: 4.0000 mg | Freq: Four times a day (QID) | INTRAMUSCULAR | Status: DC | PRN

## 2018-03-04 MED ORDER — ACETAMINOPHEN 325 MG PO TABS: 650.0000 mg | ORAL_TABLET | Freq: Four times a day (QID) | ORAL | Status: DC | PRN

## 2018-03-04 MED ORDER — PIPERACILLIN-TAZOBACTAM 3.375 G IVPB 30 MIN
3.3750 g | Freq: Once | INTRAVENOUS | Status: AC
  Administered 2018-03-04: 3.375 g via INTRAVENOUS
  Filled 2018-03-04 (×2): qty 50

## 2018-03-04 NOTE — Progress Notes (Signed)
PROGRESS NOTE  [name] XBL:[id number] DOB: 02-04-1971 DOA: 03/03/2018 PCP: Jonathon Jordan, MD  HPI/Recap of past 24 hours: HPI from Dr Phylis Bougie is a 47 y.o. female with history of gallstone pancreatitis recently admitted and cholecystectomy done about 3 weeks ago, started developing abdominal pain mostly in the epigastric area with multiple episodes of nausea, vomiting for 1 day. Had a general surgeon's follow up appt, was given pain relief medication and instructed to go to the ER if pain worsens. Since the pain worsened patient went to [place].  Denies any fever chills diarrhea or any blood in the vomitus.  Denies chest pain or shortness of breath. In the ER patient had blood work done which shows leukocytosis with AST of 365 ALT of 205 total bilirubin 4.1.  CT abdomen pelvis was unremarkable.  Patient admitted for further management. GI consulted   Today, pt still reported pain in epigastric region, reports resolved N/V, denies any fever/chills, chest pain, SOB, diarrhea.  Assessment/Plan: Principal Problem:   Abdominal pain Active Problems:   LFT elevation   Epigastric abdominal pain  Epigastric pain with elevated LFTs with recent cholecystectomy AST 335, ALT 247, T.bili 6.4, ALK 142, lipase WNL CT abdomen/pelvis: No acute findings or complications evident after recent cholecystectomy. Previously noted pancreatic inflammation has resolved by imaging MRCP showed: Status post cholecystectomy. No fluid collection in the gallbladder Fossa. No intrahepatic or extrahepatic ductal dilatation. No choledocholithiasis is seen. No peripancreatic inflammatory changes on MRI Continue IVF, pain control GI on board: May require ERCP Daily CMP Monitor closely  Leukocytosis ?chronic, since 01/2018, even in 2009 Unclear etiology for now Daily CBC to monitor trend  Normocytic anemia of unclear etiology Baseline around 12 Anemia panel Daily  cbc  Previous hx of ischemic colitis CT abdomen pelvis unremarkable    Code Status: Full  Family Communication: None at bedside  Disposition Plan: Once work-up complete/stable   Consultants:  GI  Procedures:  None  Antimicrobials:  None  DVT prophylaxis: SCD in anticipation of procedure   Objective: Vitals:   03/03/18 2348 03/04/18 0046 03/04/18 0539 03/04/18 1444  BP: 125/82 121/82 111/72 113/80  Pulse: 80 70 83 68  Resp: _0 Temp: 99.4 F (37.4 C) 98.8 F (37.1 C) 98.2 F (36.8 C) 99.2 F (37.3 C)  TempSrc: Oral Oral Oral Oral  SpO2: 100% 99% 99% 100%  Weight:      Height:        Intake/Output Summary (Last 24 hours) at 03/04/2018 1507 Last data filed at 03/04/2018 1400 Gross per 24 hour  Intake 280.75 ml  Output 950 ml  Net -669.25 ml   Filed Weights   03/03/18 2113  Weight: 59.8 kg    Exam:   General: NAD  Cardiovascular: S1, S2 present  Respiratory: CTA B  Abdomen: Soft, mild tenderness around the epigastric region, nondistended, bowel sounds present, previous incision C/D/I  Musculoskeletal: No pedal edema bilaterally  Skin: Normal  Psychiatry: Normal mood   Data Reviewed: CBC: Recent Labs  Lab 03/03/18 2130 03/04/18 0328  WBC 27.9* 17.6*  HGB 12.6 10.7*  HCT 38.7 33.4*  MCV 87.0 87.4  PLT 400 300   Basic Metabolic Panel: Recent Labs  Lab 03/03/18 2130 03/04/18 0328 03/04/18 1121  NA 136 140 138  K 3.5 3.6 3.5  CL 101 108 106  CO2 _1 GLUCOSE 143* 114* 109*  BUN _2 CREATININE 0.58 0.70 0.63  CALCIUM 9.0 8.3* 8.1*   GFR: Estimated Creatinine Clearance: 79.9 mL/min (by C-G formula based on SCr of 0.63 mg/dL). Liver Function Tests: Recent Labs  Lab 03/03/18 2130 03/04/18 0328 03/04/18 1121  AST 368* 350* 335*  ALT 205* 219* 247*  ALKPHOS 121 116 142*  BILITOT 4.1* 4.4* 6.4*  PROT 7.5 6.3* 6.5  ALBUMIN 3.9 3.4* 3.4*   Recent Labs  Lab 03/03/18 2130 03/04/18 0328  LIPASE 24  23   No results for input(s): AMMONIA in the last 168 hours. Coagulation Profile: No results for input(s): INR, PROTIME in the last 168 hours. Cardiac Enzymes: No results for input(s): CKTOTAL, CKMB, CKMBINDEX, TROPONINI in the last 168 hours. BNP (last 3 results) No results for input(s): PROBNP in the last 8760 hours. HbA1C: No results for input(s): HGBA1C in the last 72 hours. CBG: No results for input(s): GLUCAP in the last 168 hours. Lipid Profile: No results for input(s): CHOL, HDL, LDLCALC, TRIG, CHOLHDL, LDLDIRECT in the last 72 hours. Thyroid Function Tests: No results for input(s): TSH, T4TOTAL, FREET4, T3FREE, THYROIDAB in the last 72 hours. Anemia Panel: No results for input(s): VITAMINB12, FOLATE, FERRITIN, TIBC, IRON, RETICCTPCT in the last 72 hours. Urine analysis:    Component Value Date/Time   COLORURINE YELLOW 03/03/2018 2338   APPEARANCEUR CLEAR 03/03/2018 2338   LABSPEC 1.015 03/03/2018 2338   PHURINE 6.5 03/03/2018 [place] 03/03/2018 2338   HGBUR TRACE (A) 03/03/2018 2338   BILIRUBINUR MODERATE (A) 03/03/2018 2338   KETONESUR NEGATIVE 03/03/2018 2338   PROTEINUR NEGATIVE 03/03/2018 2338   UROBILINOGEN 0.2 04/25/2007 1418   NITRITE NEGATIVE 03/03/2018 2338   LEUKOCYTESUR NEGATIVE 03/03/2018 2338   Sepsis Labs: _0 (procalcitonin:4,lacticidven:4)  )No results found for this or any previous visit (from the past 240 hour(s)).    Studies: Ct Abdomen Pelvis W Contrast  Result Date: 03/03/2018 CLINICAL DATA:  Mid abdominal pain with nausea and vomiting. History of laparoscopic cholecystectomy on 02/16/2018 for cholelithiasis and biliary pancreatitis. EXAM: CT ABDOMEN AND PELVIS WITH CONTRAST TECHNIQUE: Multidetector CT imaging of the abdomen and pelvis was performed using the standard protocol following bolus administration of intravenous contrast. CONTRAST:  152m ISOVUE-300 IOPAMIDOL (ISOVUE-300) INJECTION 61% COMPARISON:  02/13/2018  FINDINGS: Lower chest: No acute abnormality. Hepatobiliary: No focal liver abnormality is seen. Status post cholecystectomy. No biliary dilatation. No abnormal fluid collections are identified post cholecystectomy. Cystic duct clips are normally positioned and show no obvious evidence of migration by CT. Pancreas: Pancreatic inflammation has resolved since the prior CT. No mass or pancreatic ductal dilatation. No evidence of pseudocyst. Spleen: Normal in size without focal abnormality. Adrenals/Urinary Tract: Adrenal glands are unremarkable. Kidneys are normal, without renal calculi, focal lesion, or hydronephrosis. Bladder is unremarkable. Stomach/Bowel: No evidence of bowel obstruction, ileus or inflammation. No free air. No bowel lesions identified. Vascular/Lymphatic: No significant vascular findings are present. No enlarged abdominal or pelvic lymph nodes. Reproductive: Uterus and bilateral adnexa are unremarkable. Other: No abdominal wall hernia or abnormality. No focal abscess or ascites. Musculoskeletal: Stable degenerative disc disease at L5-S1. IMPRESSION: No acute findings or complications evident after recent cholecystectomy. Previously noted pancreatic inflammation has resolved by imaging. Electronically Signed   By: GAletta EdouardM.D.   On: 03/03/2018 16:29   Mr 3d Recon At Scanner  Result Date: 03/04/2018 CLINICAL DATA:  Severe upper abdominal pain, status post cholecystectomy 2 weeks ago, elevated LFTs EXAM: MRI ABDOMEN WITHOUT AND WITH CONTRAST (INCLUDING MRCP) TECHNIQUE: Multiplanar multisequence MR imaging of the abdomen  was performed both before and after the administration of intravenous contrast. Heavily T2-weighted images of the biliary and pancreatic ducts were obtained, and three-dimensional MRCP images were rendered by post processing. CONTRAST:  6 mL Gadovist IV COMPARISON:  CT abdomen/pelvis dated 03/03/2018. MRI abdomen dated 02/14/2018. FINDINGS: Lower chest: Lung bases are  clear. Hepatobiliary: Liver is within normal limits. No suspicious/enhancing hepatic lesions. No hepatic steatosis. Status post cholecystectomy. No fluid collection in the gallbladder fossa. No intrahepatic or extrahepatic ductal dilatation. No choledocholithiasis is seen. Pancreas: Within normal limits. No peripancreatic inflammatory changes. Spleen:  Within normal limits. Adrenals/Urinary Tract:  Adrenal glands are within normal limits. Kidneys are within normal limits.  No hydronephrosis. Stomach/Bowel: Stomach is within normal limits. Visualized bowel is unremarkable. Vascular/Lymphatic:  No evidence of abdominal aortic aneurysm. No suspicious abdominal lymphadenopathy. Other:  No abdominal ascites. Musculoskeletal: No focal osseous lesions. IMPRESSION: Status post cholecystectomy. No fluid collection in the gallbladder fossa. No intrahepatic or extrahepatic ductal dilatation. No choledocholithiasis is seen. No peripancreatic inflammatory changes on MRI. Electronically Signed   By: Julian Hy M.D.   On: 03/04/2018 10:07   Mr Abdomen Mrcp Moise Boring Contast  Result Date: 03/04/2018 CLINICAL DATA:  Severe upper abdominal pain, status post cholecystectomy 2 weeks ago, elevated LFTs EXAM: MRI ABDOMEN WITHOUT AND WITH CONTRAST (INCLUDING MRCP) TECHNIQUE: Multiplanar multisequence MR imaging of the abdomen was performed both before and after the administration of intravenous contrast. Heavily T2-weighted images of the biliary and pancreatic ducts were obtained, and three-dimensional MRCP images were rendered by post processing. CONTRAST:  6 mL Gadovist IV COMPARISON:  CT abdomen/pelvis dated 03/03/2018. MRI abdomen dated 02/14/2018. FINDINGS: Lower chest: Lung bases are clear. Hepatobiliary: Liver is within normal limits. No suspicious/enhancing hepatic lesions. No hepatic steatosis. Status post cholecystectomy. No fluid collection in the gallbladder fossa. No intrahepatic or extrahepatic ductal dilatation. No  choledocholithiasis is seen. Pancreas: Within normal limits. No peripancreatic inflammatory changes. Spleen:  Within normal limits. Adrenals/Urinary Tract:  Adrenal glands are within normal limits. Kidneys are within normal limits.  No hydronephrosis. Stomach/Bowel: Stomach is within normal limits. Visualized bowel is unremarkable. Vascular/Lymphatic:  No evidence of abdominal aortic aneurysm. No suspicious abdominal lymphadenopathy. Other:  No abdominal ascites. Musculoskeletal: No focal osseous lesions. IMPRESSION: Status post cholecystectomy. No fluid collection in the gallbladder fossa. No intrahepatic or extrahepatic ductal dilatation. No choledocholithiasis is seen. No peripancreatic inflammatory changes on MRI. Electronically Signed   By: Julian Hy M.D.   On: 03/04/2018 10:07    Scheduled Meds:  Continuous Infusions: . sodium chloride 100 mL/hr at 03/04/18 0310     LOS: 0 days     Alma Friendly, MD [facility]   If 7PM-7AM, please contact night-coverage [email or web address] 03/04/2018, 3:07 PM

## 2018-03-04 NOTE — Progress Notes (Signed)
[facility] Progress Note     Subjective: CC: epigastric pain Patient is s/p lap chole 02/16/18 with negative cholangiogram. [facility] worsened epigastric abdominal pain that radiates to the back. Pain characterized as severe, currently mild after getting morphine early this AM. Associated nausea and vomiting. Patient also reports some loose stools, but no bloody stools. Patient also reports that she remembers having hepatitis when she was 89 or 47 years old in [place], but is unsure what genotype. She remembers that she had to be quarantined.   Objective: Vital signs in last 24 hours: Temp:  [98.2 F (36.8 C)-99.6 F (37.6 C)] 98.2 F (36.8 C) (11/13 0539) Pulse Rate:  [65-88] 83 (11/13 0539) Resp:  [12-20] 12 (11/13 0539) BP: (111-131)/(72-94) 111/72 (11/13 0539) SpO2:  [98 %-100 %] 99 % (11/13 0539) Weight:  [59.2 kg-59.8 kg] 59.8 kg (11/12 2113)    Intake/Output from previous day: 11/12 0701 - 11/13 0700 In: 280.8 [I.V.:280.8] Out: -  Intake/Output this shift: No intake/output data recorded.  PE: Gen:  Alert, NAD, pleasant Card:  Regular rate and rhythm Pulm:  Normal effort, clear to auscultation bilaterally Abd: Soft, mild epigastric TTP, non-distended, bowel sounds present, no HSM, no hernia, negative murphy sign  Skin: warm and dry, no rashes  Psych: A&Ox3   Lab Results:  Recent Labs    03/03/18 2130 03/04/18 0328  WBC 27.9* 17.6*  HGB 12.6 10.7*  HCT 38.7 33.4*  PLT 400 356   BMET Recent Labs    03/03/18 2130 03/04/18 0328  NA 136 140  K 3.5 3.6  CL 101 108  CO2 24 25  GLUCOSE 143* 114*  BUN 10 8  CREATININE 0.58 0.70  CALCIUM 9.0 8.3*   PT/INR No results for input(s): LABPROT, INR in the last 72 hours. CMP     Component Value Date/Time   NA 140 03/04/2018 0328   K 3.6 03/04/2018 0328   CL 108 03/04/2018 0328   CO2 25 03/04/2018 0328   GLUCOSE 114 (H) 03/04/2018 0328   BUN 8 03/04/2018 0328   CREATININE 0.70  03/04/2018 0328   CALCIUM 8.3 (L) 03/04/2018 0328   PROT 6.3 (L) 03/04/2018 0328   ALBUMIN 3.4 (L) 03/04/2018 0328   AST 350 (H) 03/04/2018 0328   ALT 219 (H) 03/04/2018 0328   ALKPHOS 116 03/04/2018 0328   BILITOT 4.4 (H) 03/04/2018 0328   GFRNONAA >60 03/04/2018 0328   GFRAA >60 03/04/2018 0328   Lipase     Component Value Date/Time   LIPASE 23 03/04/2018 0328       Studies/Results: Ct Abdomen Pelvis W Contrast  Result Date: 03/03/2018 CLINICAL DATA:  Mid abdominal pain with nausea and vomiting. History of laparoscopic cholecystectomy on 02/16/2018 for cholelithiasis and biliary pancreatitis. EXAM: CT ABDOMEN AND PELVIS WITH CONTRAST TECHNIQUE: Multidetector CT imaging of the abdomen and pelvis was performed using the standard protocol following bolus administration of intravenous contrast. CONTRAST:  100mL ISOVUE-300 IOPAMIDOL (ISOVUE-300) INJECTION 61% COMPARISON:  02/13/2018 FINDINGS: Lower chest: No acute abnormality. Hepatobiliary: No focal liver abnormality is seen. Status post cholecystectomy. No biliary dilatation. No abnormal fluid collections are identified post cholecystectomy. Cystic duct clips are normally positioned and show no obvious evidence of migration by CT. Pancreas: Pancreatic inflammation has resolved since the prior CT. No mass or pancreatic ductal dilatation. No evidence of pseudocyst. Spleen: Normal in size without focal abnormality. Adrenals/Urinary Tract: Adrenal glands are unremarkable. Kidneys are normal, without renal calculi, focal lesion, or hydronephrosis.  Bladder is unremarkable. Stomach/Bowel: No evidence of bowel obstruction, ileus or inflammation. No free air. No bowel lesions identified. Vascular/Lymphatic: No significant vascular findings are present. No enlarged abdominal or pelvic lymph nodes. Reproductive: Uterus and bilateral adnexa are unremarkable. Other: No abdominal wall hernia or abnormality. No focal abscess or ascites. Musculoskeletal:  Stable degenerative disc disease at L5-S1. IMPRESSION: No acute findings or complications evident after recent cholecystectomy. Previously noted pancreatic inflammation has resolved by imaging. Electronically Signed   By: Irish Lack M.D.   On: 03/03/2018 16:29   Mr 3d Recon At Scanner  Result Date: 03/04/2018 CLINICAL DATA:  Severe upper abdominal pain, status post cholecystectomy 2 weeks ago, elevated LFTs EXAM: MRI ABDOMEN WITHOUT AND WITH CONTRAST (INCLUDING MRCP) TECHNIQUE: Multiplanar multisequence MR imaging of the abdomen was performed both before and after the administration of intravenous contrast. Heavily T2-weighted images of the biliary and pancreatic ducts were obtained, and three-dimensional MRCP images were rendered by post processing. CONTRAST:  6 mL Gadovist IV COMPARISON:  CT abdomen/pelvis dated 03/03/2018. MRI abdomen dated 02/14/2018. FINDINGS: Lower chest: Lung bases are clear. Hepatobiliary: Liver is within normal limits. No suspicious/enhancing hepatic lesions. No hepatic steatosis. Status post cholecystectomy. No fluid collection in the gallbladder fossa. No intrahepatic or extrahepatic ductal dilatation. No choledocholithiasis is seen. Pancreas: Within normal limits. No peripancreatic inflammatory changes. Spleen:  Within normal limits. Adrenals/Urinary Tract:  Adrenal glands are within normal limits. Kidneys are within normal limits.  No hydronephrosis. Stomach/Bowel: Stomach is within normal limits. Visualized bowel is unremarkable. Vascular/Lymphatic:  No evidence of abdominal aortic aneurysm. No suspicious abdominal lymphadenopathy. Other:  No abdominal ascites. Musculoskeletal: No focal osseous lesions. IMPRESSION: Status post cholecystectomy. No fluid collection in the gallbladder fossa. No intrahepatic or extrahepatic ductal dilatation. No choledocholithiasis is seen. No peripancreatic inflammatory changes on MRI. Electronically Signed   By: Charline Bills M.D.   On:  03/04/2018 10:07   Mr Abdomen Mrcp Vivien Rossetti Contast  Result Date: 03/04/2018 CLINICAL DATA:  Severe upper abdominal pain, status post cholecystectomy 2 weeks ago, elevated LFTs EXAM: MRI ABDOMEN WITHOUT AND WITH CONTRAST (INCLUDING MRCP) TECHNIQUE: Multiplanar multisequence MR imaging of the abdomen was performed both before and after the administration of intravenous contrast. Heavily T2-weighted images of the biliary and pancreatic ducts were obtained, and three-dimensional MRCP images were rendered by post processing. CONTRAST:  6 mL Gadovist IV COMPARISON:  CT abdomen/pelvis dated 03/03/2018. MRI abdomen dated 02/14/2018. FINDINGS: Lower chest: Lung bases are clear. Hepatobiliary: Liver is within normal limits. No suspicious/enhancing hepatic lesions. No hepatic steatosis. Status post cholecystectomy. No fluid collection in the gallbladder fossa. No intrahepatic or extrahepatic ductal dilatation. No choledocholithiasis is seen. Pancreas: Within normal limits. No peripancreatic inflammatory changes. Spleen:  Within normal limits. Adrenals/Urinary Tract:  Adrenal glands are within normal limits. Kidneys are within normal limits.  No hydronephrosis. Stomach/Bowel: Stomach is within normal limits. Visualized bowel is unremarkable. Vascular/Lymphatic:  No evidence of abdominal aortic aneurysm. No suspicious abdominal lymphadenopathy. Other:  No abdominal ascites. Musculoskeletal: No focal osseous lesions. IMPRESSION: Status post cholecystectomy. No fluid collection in the gallbladder fossa. No intrahepatic or extrahepatic ductal dilatation. No choledocholithiasis is seen. No peripancreatic inflammatory changes on MRI. Electronically Signed   By: Charline Bills M.D.   On: 03/04/2018 10:07    Anti-infectives: Anti-infectives (From admission, onward)   None       Assessment/Plan Hx of ischemic colitis Hx of hepatitis - age 62 or 33, unsure whether it was A/B/C/E  Hx of gallstone pancreatitis -  s/p  laparoscopic cholecystectomy with negative IOC 02/16/18 Dr. Gerrit Friends  - CT 11/12: no collection in gallbladder fossa and resolution in pancreatic inflammation  - MRCP negative for choledocholithiasis - AST/ALT 350/219, Tbili 4.4, WBC 17.6 - hepatitis panel ordered  - Consulted GI - we will follow with you   FEN: NPO, IVF VTE: SCDs ID: No current abx  LOS: 0 days    Wells Guiles , [facility] 03/04/2018, 10:24 AM Pager: [phone] Consults: [phone] Mon-Fri 7:00 am-4:30 pm Sat-Sun 7:00 am-11:30 am

## 2018-03-04 NOTE — Consult Note (Addendum)
Consultation  Referring Provider: Dr. Horris Latino     Primary Care Physician:  Jonathon Jordan, MD Primary Gastroenterologist: Dr. Carlean Purl       Reason for Consultation: Elevated LFTs and epigastric pain, status post lap choley 10/28         HPI:   [name] is a 47 y.o. female with a past medical history of gallstone pancreatitis recently admitted on 10/28 with cholecystectomy done, who presented to the ER on 03/03/2018 with abdominal pain mostly in the epigastric area with multiple episodes of nausea and vomiting which started 03/02/2018 a.m.    Today, patient explains that she was doing well after her cholecystectomy other than a slight amount of discomfort off-and-on in her epigastrium typically related to eating.  Over the past 3 days though this pain increased.  Patient tells me that on 03/03/2018 her pain increased and became constant rated as an 8-9/10 throughout the day.  Her husband gave her a couple of Tylenol and she was able to go to sleep but awoke at 4 AM with pain that "came back with a vengeance".  She rated this pain is a 13/10 and describes it as sharp, radiating through to her back.  Describes that she was given some pain medication at her surgeon's office yesterday and did take a couple of Vicodin which only helped minimally.  She then started with vomiting, at least 3-4 episodes yesterday and proceeded to the ER.  Describes this pain as very similar to the pain she experienced prior to her gallstone pancreatitis.      Currently patient tells me that after receiving morphine at 3 AM she has no further epigastric pain and she tells me this is only "a feeling like I know it is there/more of a discomfort".  She has not eaten or drank anything today.    Vague history of hepatitis as a child which caused jaundice.    She denies any fever or chills, diarrhea or hematemesis.  ED course: Leukocytosis, AST 365, ALT of 205, total bili 4.1, CT abdomen pelvis unremarkable  Past  Medical History:  Diagnosis Date  . Allergic rhinitis   . Asthma   . Gallstones 1/09   on CT  . IBS (irritable bowel syndrome)   . Ischemic colitis ([place]) 1/09  . 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 CHOLECYSTECTOMY N/A 02/16/2018   Procedure: LAPAROSCOPIC CHOLECYSTECTOMY WITH INTRAOPERATIVE CHOLANGIOGRAM;  Surgeon: Armandina Gemma, MD;  Location: [facility];  Service: General;  Laterality: N/A;  . COLONOSCOPY  1/09    Family History  Problem Relation Age of Onset  . Diabetes Mellitus II Neg Hx   . Hypertension Neg Hx     Social History   Tobacco Use  . Smoking status: Never Smoker  . Smokeless tobacco: Never Used  Substance Use Topics  . Alcohol use: Yes    Comment: occasional  . Drug use: No    Prior to Admission medications   Medication Sig Start Date End Date Taking? Authorizing Provider  amoxicillin-clavulanate (AUGMENTIN) 875-125 MG tablet Take 1 tablet by mouth 2 (two) times daily.   Yes [provider]  calcium-vitamin D (OSCAL WITH D) 500-200 MG-UNIT tablet Take 2 tablets by mouth daily with breakfast.   Yes [provider]  cetirizine (ZYRTEC) 10 MG tablet Take 10 mg by mouth daily.   Yes [provider]  cyclobenzaprine (FLEXERIL) 10 MG tablet Take  10 mg by mouth 3 (three) times daily as needed for muscle spasms.  04/01/13  Yes [provider]  diclofenac (VOLTAREN) 75 MG EC tablet Take 75 mg by mouth 2 (two) times daily as needed for mild pain.    Yes [provider]  fexofenadine (ALLEGRA) 180 MG tablet Take 180 mg by mouth daily.   Yes [provider]  GLUCOSAMINE-CHONDROITIN PO Take 1 tablet by mouth daily.   Yes [provider]  HYDROcodone-acetaminophen (NORCO/VICODIN) 5-325 MG tablet Take 1-2 tablets by mouth every 4 (four) hours as needed for moderate pain. 02/17/18  Yes Nita Sells, MD  montelukast (SINGULAIR) 10 MG tablet Take  10 mg by mouth daily.  06/26/13  Yes [provider]  ondansetron (ZOFRAN) 4 MG tablet Take 4 mg by mouth every 8 (eight) hours as needed for nausea or vomiting.   Yes [provider]  Probiotic Product (ALIGN) 4 MG CAPS Take 1 capsule by mouth daily.   Yes [provider]  traMADol (ULTRAM) 50 MG tablet Take 1 tablet (50 mg total) by mouth every 6 (six) hours as needed for severe pain. 02/17/18  Yes Meuth, Brooke A, PA-C  tretinoin (RETIN-A) 0.025 % cream Apply 1 application topically 2 (two) times a week.  05/30/16  Yes [provider]  VENTOLIN HFA 108 (90 BASE) MCG/ACT inhaler Inhale 2 puffs into the lungs every 4 (four) hours as needed for wheezing or shortness of breath. Before exercise 04/24/13  Yes [provider]    Current Facility-Administered Medications  Medication Dose Route Frequency Provider Last Rate Last Dose  . 0.9 %  sodium chloride infusion   Intravenous Continuous Rise Patience, MD 100 mL/hr at 03/04/18 0310    . acetaminophen (TYLENOL) tablet 650 mg  650 mg Oral Q6H PRN Rise Patience, MD       Or  . acetaminophen (TYLENOL) suppository 650 mg  650 mg Rectal Q6H PRN Rise Patience, MD      . morphine 2 MG/ML injection 1 mg  1 mg Intravenous Q2H PRN Rise Patience, MD   1 mg at 03/04/18 0309  . ondansetron (ZOFRAN) tablet 4 mg  4 mg Oral Q6H PRN Rise Patience, MD       Or  . ondansetron [facility]) injection 4 mg  4 mg Intravenous Q6H PRN Rise Patience, MD        Allergies as of 03/03/2018  . (No Known Allergies)     Review of Systems:    Constitutional: No weight loss, fever or chills Skin: No rash Cardiovascular: No chest pain Respiratory: No SOB  Gastrointestinal: See HPI and otherwise negative Genitourinary: No dysuria  Neurological: No headache, dizziness or syncope Musculoskeletal: No new muscle or joint pain Hematologic: No bleeding Psychiatric: No history of depression or anxiety     Physical Exam:  Vital signs in last 24 hours: Temp:  [98.2 F (36.8 C)-99.6 F (37.6 C)] 98.2 F (36.8 C) (11/13 0539) Pulse Rate:  [65-88] 83 (11/13 0539) Resp:  [12-20] 12 (11/13 0539) BP: (111-131)/(72-94) 111/72 (11/13 0539) SpO2:  [98 %-100 %] 99 % (11/13 0539) Weight:  [59.2 kg-59.8 kg] 59.8 kg (11/12 2113)   General:   Pleasant female appears to be in NAD, Well developed, Well nourished, alert and cooperative Head:  Normocephalic and atraumatic. Eyes:   PEERL, EOMI. No icterus. Conjunctiva pink. Ears:  Normal auditory acuity. Neck:  Supple Throat: Oral cavity and pharynx without inflammation, swelling or  lesion.  Lungs: Respirations even and unlabored. Lungs clear to auscultation bilaterally.   No wheezes, crackles, or rhonchi.  Heart: Normal S1, S2. No MRG. Regular rate and rhythm. No peripheral edema, cyanosis or pallor.  Abdomen:  Soft, nondistended, nontender. No rebound or guarding. Normal bowel sounds. No appreciable masses or hepatomegaly. Rectal:  Not performed.  Msk:  Symmetrical without gross deformities. Extremities:  Without edema, no deformity or joint abnormality. Neurologic:  Alert and  oriented x4;  grossly normal neurologically.  Skin:   Dry and intact without significant lesions or rashes. Psychiatric: Demonstrates good judgement and reason without abnormal affect or behaviors.   LAB RESULTS: Recent Labs    03/03/18 2130 03/04/18 0328  WBC 27.9* 17.6*  HGB 12.6 10.7*  HCT 38.7 33.4*  PLT 400 356   BMET Recent Labs    03/03/18 2130 03/04/18 0328  NA 136 140  K 3.5 3.6  CL 101 108  CO2 24 25  GLUCOSE 143* 114*  BUN 10 8  CREATININE 0.58 0.70  CALCIUM 9.0 8.3*   Hepatic Function Latest Ref Rng & Units 03/04/2018 03/03/2018 02/17/2018  Total Protein 6.5 - 8.1 g/dL 6.3(L) 7.5 6.1(L)  Albumin 3.5 - 5.0 g/dL 3.4(L) 3.9 2.8(L)  AST 15 - 41 U/L 350(H) 368(H) 57(H)  ALT 0 - 44 U/L 219(H) 205(H) 38  Alk Phosphatase 38 - 126 U/L 116 121 99    Total Bilirubin 0.3 - 1.2 mg/dL 4.4(H) 4.1(H) 0.9  Bilirubin, Direct 0.0 - 0.2 mg/dL 2.5(H) - -   STUDIES: Ct Abdomen Pelvis W Contrast  Result Date: 03/03/2018 CLINICAL DATA:  Mid abdominal pain with nausea and vomiting. History of laparoscopic cholecystectomy on 02/16/2018 for cholelithiasis and biliary pancreatitis. EXAM: CT ABDOMEN AND PELVIS WITH CONTRAST TECHNIQUE: Multidetector CT imaging of the abdomen and pelvis was performed using the standard protocol following bolus administration of intravenous contrast. CONTRAST:  19m ISOVUE-300 IOPAMIDOL (ISOVUE-300) INJECTION 61% COMPARISON:  02/13/2018 FINDINGS: Lower chest: No acute abnormality. Hepatobiliary: No focal liver abnormality is seen. Status post cholecystectomy. No biliary dilatation. No abnormal fluid collections are identified post cholecystectomy. Cystic duct clips are normally positioned and show no obvious evidence of migration by CT. Pancreas: Pancreatic inflammation has resolved since the prior CT. No mass or pancreatic ductal dilatation. No evidence of pseudocyst. Spleen: Normal in size without focal abnormality. Adrenals/Urinary Tract: Adrenal glands are unremarkable. Kidneys are normal, without renal calculi, focal lesion, or hydronephrosis. Bladder is unremarkable. Stomach/Bowel: No evidence of bowel obstruction, ileus or inflammation. No free air. No bowel lesions identified. Vascular/Lymphatic: No significant vascular findings are present. No enlarged abdominal or pelvic lymph nodes. Reproductive: Uterus and bilateral adnexa are unremarkable. Other: No abdominal wall hernia or abnormality. No focal abscess or ascites. Musculoskeletal: Stable degenerative disc disease at L5-S1. IMPRESSION: No acute findings or complications evident after recent cholecystectomy. Previously noted pancreatic inflammation has resolved by imaging. Electronically Signed   By: GAletta EdouardM.D.   On: 03/03/2018 16:29   Mr 3d Recon At  Scanner  Result Date: 03/04/2018 CLINICAL DATA:  Severe upper abdominal pain, status post cholecystectomy 2 weeks ago, elevated LFTs EXAM: MRI ABDOMEN WITHOUT AND WITH CONTRAST (INCLUDING MRCP) TECHNIQUE: Multiplanar multisequence MR imaging of the abdomen was performed both before and after the administration of intravenous contrast. Heavily T2-weighted images of the biliary and pancreatic ducts were obtained, and three-dimensional MRCP images were rendered by post processing. CONTRAST:  6 mL Gadovist IV COMPARISON:  CT abdomen/pelvis dated 03/03/2018. MRI abdomen dated 02/14/2018.  FINDINGS: Lower chest: Lung bases are clear. Hepatobiliary: Liver is within normal limits. No suspicious/enhancing hepatic lesions. No hepatic steatosis. Status post cholecystectomy. No fluid collection in the gallbladder fossa. No intrahepatic or extrahepatic ductal dilatation. No choledocholithiasis is seen. Pancreas: Within normal limits. No peripancreatic inflammatory changes. Spleen:  Within normal limits. Adrenals/Urinary Tract:  Adrenal glands are within normal limits. Kidneys are within normal limits.  No hydronephrosis. Stomach/Bowel: Stomach is within normal limits. Visualized bowel is unremarkable. Vascular/Lymphatic:  No evidence of abdominal aortic aneurysm. No suspicious abdominal lymphadenopathy. Other:  No abdominal ascites. Musculoskeletal: No focal osseous lesions. IMPRESSION: Status post cholecystectomy. No fluid collection in the gallbladder fossa. No intrahepatic or extrahepatic ductal dilatation. No choledocholithiasis is seen. No peripancreatic inflammatory changes on MRI. Electronically Signed   By: Julian Hy M.D.   On: 03/04/2018 10:07   Mr Abdomen Mrcp Moise Boring Contast  Result Date: 03/04/2018 CLINICAL DATA:  Severe upper abdominal pain, status post cholecystectomy 2 weeks ago, elevated LFTs EXAM: MRI ABDOMEN WITHOUT AND WITH CONTRAST (INCLUDING MRCP) TECHNIQUE: Multiplanar multisequence MR imaging  of the abdomen was performed both before and after the administration of intravenous contrast. Heavily T2-weighted images of the biliary and pancreatic ducts were obtained, and three-dimensional MRCP images were rendered by post processing. CONTRAST:  6 mL Gadovist IV COMPARISON:  CT abdomen/pelvis dated 03/03/2018. MRI abdomen dated 02/14/2018. FINDINGS: Lower chest: Lung bases are clear. Hepatobiliary: Liver is within normal limits. No suspicious/enhancing hepatic lesions. No hepatic steatosis. Status post cholecystectomy. No fluid collection in the gallbladder fossa. No intrahepatic or extrahepatic ductal dilatation. No choledocholithiasis is seen. Pancreas: Within normal limits. No peripancreatic inflammatory changes. Spleen:  Within normal limits. Adrenals/Urinary Tract:  Adrenal glands are within normal limits. Kidneys are within normal limits.  No hydronephrosis. Stomach/Bowel: Stomach is within normal limits. Visualized bowel is unremarkable. Vascular/Lymphatic:  No evidence of abdominal aortic aneurysm. No suspicious abdominal lymphadenopathy. Other:  No abdominal ascites. Musculoskeletal: No focal osseous lesions. IMPRESSION: Status post cholecystectomy. No fluid collection in the gallbladder fossa. No intrahepatic or extrahepatic ductal dilatation. No choledocholithiasis is seen. No peripancreatic inflammatory changes on MRI. Electronically Signed   By: Julian Hy M.D.   On: 03/04/2018 10:07    Impression / Plan:   Impression: 1.  Epigastric pain with elevated LFTs and recent cholecystectomy: CT of the pelvis as well as MRCP are negative/normal, LFTs have continued to increase over the past 24 hours, AST 368--> 350, ALT 205--> 219, total bilirubin 4.1--> 4.4, alk phos normal, though also minimally increased from baseline 99-->116, consider microlithiasis vs bile leak vs hepatitis vs other 2.  Leukocytosis: 27.9--> 17.6 3.  History of ischemic colitis  Plan: 1.  Will discuss case with Dr.  Lyndel Safe. 2. Will need to continue to trend LFTs daily, if increasing tomorrow could consider ERCP for further eval 3. Patient can have clear liquid diet from GI standpoint today and NPO after midnight- unless other teams need her NPO 4. Please await any further recommendations from Dr. Lyndel Safe later today  Thank you for your kind consultation, we will continue to follow.  Lavone Nian Lemmon  03/04/2018, 11:14 AM   Attending physician's note   I have taken an interval history, reviewed the chart and examined the patient. I agree with the Advanced Practitioner's note, impression and recommendations.   Very strange clinical presentation and imaging results.  Anderson Malta has discussed with Dr. Rush Landmark as well.  I have reviewed all the imaging studies including CT, IOC, MRI myself and  with GI radiologist.  47 year old with painful obstructive jaundice and abn LFTs (AST 350, ALT 219, TB 4.4, DB 2.5, Nl lipase today) after uneventful lap chole with good quality IOC d/t biliary pancreatitis on 02/16/2018 (Nl LFTs at that time). Neg post-op CT, MRCP/MRI for leak, biliary dilatation, intra-abdominal fluid collection or pancreatitis. Has increased WBC count but no fever/chills s/o ascending cholangitis.   At first, on my review, I thought left-sided ducts were sl more dilated. GI radiologist does not feel that way.  Plan: HIDA in AM, empiric antibiotics, will proceed with hepatitis WU with ASMA, AMA, acute hepatitis profile etc. I do not believe ERCP would be helpful at this time (given the ducts are not dilated and no leak, may be more risky due to recent pancreatitis).  I believe EUS would be more helpful and without complications if HIDA scan is negative and patient does not respond to antibiotics. May consider pancreatic protocol CT if EUS cannot be performed d/t scheduling constraints. May need liver Bx.  Carmell Austria, MD

## 2018-03-04 NOTE — Progress Notes (Signed)
Pharmacy Antibiotic Note  [name] is a 47 y.o. female admitted on 03/03/2018 with intra-abdominal infection.  Pharmacy has been consulted for piperacillin/tazobactam dosing.  Patient was recently admitted with gallstone pancreatitis and underwent cholecystectomy ~3 weeks ago. Patient presents today with worsening abdominal pain. Antibiotics being started for suspected intra-abdominal infection. NKDA.  Today, 03/04/18  SCr 0.6, CrCl ~ 80 mL/min  WBC 17.6 is elevated  Afebrile  Plan:  Piperacillin/tazobacta, 3.375 g IV q8h EI  Follow culture data, clinical course, renal function  Height: 5\' 5"  (165.1 cm) Weight: 131 lb 12.8 oz (59.8 kg) IBW/kg (Calculated) : 57  Temp (24hrs), Avg:98.9 F (37.2 C), Min:98.2 F (36.8 C), Max:99.6 F (37.6 C)  Recent Labs  Lab 03/03/18 2130 03/04/18 0328 03/04/18 1121  WBC 27.9* 17.6*  --   CREATININE 0.58 0.70 0.63    Estimated Creatinine Clearance: 79.9 mL/min (by C-G formula based on SCr of 0.63 mg/dL).    No Known Allergies  Antimicrobials this admission: Piperacillin/tazobactam 11/13 >>   Dose adjustments this admission:  Microbiology results: None  Thank you for allowing pharmacy to be a part of this patient's care.  Cindi CarbonMary M Shariyah Hoover, PharmD Clinical Pharmacist 03/04/2018 6:55 PM

## 2018-03-04 NOTE — H&P (Signed)
History and Physical    [name] ZOX:[id number] DOB: 12-Feb-1971 DOA: 03/03/2018  PCP: Mila Palmer, MD  Patient coming from: Home.  Chief Complaint: Abdominal pain.  HPI: [name] is a 47 y.o. female with history of gallstone pancreatitis recently admitted and cholecystectomy done about 3 weeks ago started developing abdominal pain mostly in the epigastric area with multiple episodes of nausea vomiting yesterday morning.  Had gone to have a general surgeon's office for follow-up when was given pain relief medication and instructed to go to the ER if pain worsens.  Since the pain worsened patient went to med [facility].  Denies any fever chills diarrhea or any blood in the vomitus.  Denies chest pain or shortness of breath.  ED Course: In the ER patient had blood work done which shows leukocytosis with AST of 365 ALT of 205 total bilirubin 4.1.  CT abdomen pelvis was unremarkable.  Patient admitted for possible choledocholithiasis.  Review of Systems: As per HPI, rest all negative.   Past Medical History:  Diagnosis Date  . Allergic rhinitis   . Asthma   . Gallstones 1/09   on CT  . IBS (irritable bowel syndrome)   . Ischemic colitis (HCC) 1/09  . Jejunal intussusception (HCC) 1/09   on CT    Past Surgical History:  Procedure Laterality Date  . CHOLECYSTECTOMY N/A 02/16/2018   Procedure: LAPAROSCOPIC CHOLECYSTECTOMY WITH INTRAOPERATIVE CHOLANGIOGRAM;  Surgeon: Darnell Level, MD;  Location: [facility];  Service: General;  Laterality: N/A;  . COLONOSCOPY  1/09     reports that she has never smoked. She has never used smokeless tobacco. She reports that she drinks alcohol. She reports that she does not use drugs.  No Known Allergies  Family History  Problem Relation Age of Onset  . Diabetes Mellitus II Neg Hx   . Hypertension Neg Hx     Prior to Admission medications   Medication Sig Start Date End Date Taking? Authorizing Provider    amoxicillin-clavulanate (AUGMENTIN) 875-125 MG tablet Take 1 tablet by mouth 2 (two) times daily.   Yes [provider]  calcium-vitamin D (OSCAL WITH D) 500-200 MG-UNIT tablet Take 2 tablets by mouth daily with breakfast.   Yes [provider]  cetirizine (ZYRTEC) 10 MG tablet Take 10 mg by mouth daily.   Yes [provider]  cyclobenzaprine (FLEXERIL) 10 MG tablet Take 10 mg by mouth 3 (three) times daily as needed for muscle spasms.  04/01/13  Yes [provider]  diclofenac (VOLTAREN) 75 MG EC tablet Take 75 mg by mouth 2 (two) times daily as needed for mild pain.    Yes [provider]  fexofenadine (ALLEGRA) 180 MG tablet Take 180 mg by mouth daily.   Yes [provider]  GLUCOSAMINE-CHONDROITIN PO Take 1 tablet by mouth daily.   Yes [provider]  HYDROcodone-acetaminophen (NORCO/VICODIN) 5-325 MG tablet Take 1-2 tablets by mouth every 4 (four) hours as needed for moderate pain. 02/17/18  Yes Rhetta Mura, MD  montelukast (SINGULAIR) 10 MG tablet Take 10 mg by mouth daily.  06/26/13  Yes [provider]  ondansetron (ZOFRAN) 4 MG tablet Take 4 mg by mouth every 8 (eight) hours as needed for nausea or vomiting.   Yes [provider]  Probiotic Product (ALIGN) 4 MG CAPS Take 1 capsule by mouth daily.   Yes [provider]  traMADol (ULTRAM) 50 MG tablet Take 1 tablet (50 mg total) by mouth every 6 (six)  hours as needed for severe pain. 02/17/18  Yes Meuth, Brooke A, PA-C  tretinoin (RETIN-A) 0.025 % cream Apply 1 application topically 2 (two) times a week.  05/30/16  Yes [provider]  VENTOLIN HFA 108 (90 BASE) MCG/ACT inhaler Inhale 2 puffs into the lungs every 4 (four) hours as needed for wheezing or shortness of breath. Before exercise 04/24/13  Yes [provider]    Physical Exam: Vitals:   03/03/18 2230 03/03/18 2300 03/03/18 2348 03/04/18 0046  BP: 125/78 111/73 125/82  121/82  Pulse: 78 80 80 70  Resp:   16 18  Temp:   99.4 F (37.4 C) 98.8 F (37.1 C)  TempSrc:   Oral Oral  SpO2: 98% 98% 100% 99%  Weight:      Height:          Constitutional: Moderately built and nourished. Vitals:   03/03/18 2230 03/03/18 2300 03/03/18 2348 03/04/18 0046  BP: 125/78 111/73 125/82 121/82  Pulse: 78 80 80 70  Resp:   16 18  Temp:   99.4 F (37.4 C) 98.8 F (37.1 C)  TempSrc:   Oral Oral  SpO2: 98% 98% 100% 99%  Weight:      Height:       Eyes: Anicteric no pallor. ENMT: No discharge from the ears eyes nose or mouth. Neck: No mass felt.  No neck rigidity.  Respiratory: No rhonchi or crepitations. Cardiovascular: S1-S2 heard no murmurs appreciated. Abdomen: Epigastric tenderness no guarding or rigidity. Musculoskeletal: No edema. Skin: No rash. Neurologic: Alert awake oriented to time place and person.  Moves all extremities. Psychiatric: Appears normal.   Labs on Admission: I have personally reviewed following labs and imaging studies  CBC: Recent Labs  Lab 03/03/18 2130  WBC 27.9*  HGB 12.6  HCT 38.7  MCV 87.0  PLT 400   Basic Metabolic Panel: Recent Labs  Lab 03/03/18 2130  NA 136  K 3.5  CL 101  CO2 24  GLUCOSE 143*  BUN 10  CREATININE 0.58  CALCIUM 9.0   GFR: Estimated Creatinine Clearance: 79.9 mL/min (by C-G formula based on SCr of 0.58 mg/dL). Liver Function Tests: Recent Labs  Lab 03/03/18 2130  AST 368*  ALT 205*  ALKPHOS 121  BILITOT 4.1*  PROT 7.5  ALBUMIN 3.9   Recent Labs  Lab 03/03/18 2130  LIPASE 24   No results for input(s): AMMONIA in the last 168 hours. Coagulation Profile: No results for input(s): INR, PROTIME in the last 168 hours. Cardiac Enzymes: No results for input(s): CKTOTAL, CKMB, CKMBINDEX, TROPONINI in the last 168 hours. BNP (last 3 results) No results for input(s): PROBNP in the last 8760 hours. HbA1C: No results for input(s): HGBA1C in the last 72 hours. CBG: No results for  input(s): GLUCAP in the last 168 hours. Lipid Profile: No results for input(s): CHOL, HDL, LDLCALC, TRIG, CHOLHDL, LDLDIRECT in the last 72 hours. Thyroid Function Tests: No results for input(s): TSH, T4TOTAL, FREET4, T3FREE, THYROIDAB in the last 72 hours. Anemia Panel: No results for input(s): VITAMINB12, FOLATE, FERRITIN, TIBC, IRON, RETICCTPCT in the last 72 hours. Urine analysis:    Component Value Date/Time   COLORURINE YELLOW 03/03/2018 2338   APPEARANCEUR CLEAR 03/03/2018 2338   LABSPEC 1.015 03/03/2018 2338   PHURINE 6.5 03/03/2018 2338   GLUCOSEU NEGATIVE 03/03/2018 2338   HGBUR TRACE (A) 03/03/2018 2338   BILIRUBINUR MODERATE (A) 03/03/2018 2338   KETONESUR NEGATIVE 03/03/2018 2338   PROTEINUR NEGATIVE 03/03/2018 2338  UROBILINOGEN 0.2 04/25/2007 1418   NITRITE NEGATIVE 03/03/2018 2338   LEUKOCYTESUR NEGATIVE 03/03/2018 2338   Sepsis Labs: @LABRCNTIP (procalcitonin:4,lacticidven:4) )No results found for this or any previous visit (from the past 240 hour(s)).   Radiological Exams on Admission: Ct Abdomen Pelvis W Contrast  Result Date: 03/03/2018 CLINICAL DATA:  Mid abdominal pain with nausea and vomiting. History of laparoscopic cholecystectomy on 02/16/2018 for cholelithiasis and biliary pancreatitis. EXAM: CT ABDOMEN AND PELVIS WITH CONTRAST TECHNIQUE: Multidetector CT imaging of the abdomen and pelvis was performed using the standard protocol following bolus administration of intravenous contrast. CONTRAST:  ISOVUE-300 IOPAMIDOL (ISOVUE-300) INJECTION 61% COMPARISON:  02/13/2018 FINDINGS: Lower chest: No acute abnormality. Hepatobiliary: No focal liver abnormality is seen. Status post cholecystectomy. No biliary dilatation. No abnormal fluid collections are identified post cholecystectomy. Cystic duct clips are normally positioned and show no obvious evidence of migration by CT. Pancreas: Pancreatic inflammation has resolved since the prior CT. No mass or pancreatic  ductal dilatation. No evidence of pseudocyst. Spleen: Normal in size without focal abnormality. Adrenals/Urinary Tract: Adrenal glands are unremarkable. Kidneys are normal, without renal calculi, focal lesion, or hydronephrosis. Bladder is unremarkable. Stomach/Bowel: No evidence of bowel obstruction, ileus or inflammation. No free air. No bowel lesions identified. Vascular/Lymphatic: No significant vascular findings are present. No enlarged abdominal or pelvic lymph nodes. Reproductive: Uterus and bilateral adnexa are unremarkable. Other: No abdominal wall hernia or abnormality. No focal abscess or ascites. Musculoskeletal: Stable degenerative disc disease at L5-S1. IMPRESSION: No acute findings or complications evident after recent cholecystectomy. Previously noted pancreatic inflammation has resolved by imaging. Electronically Signed   By: Irish Lack M.D.   On: 03/03/2018 16:29     Assessment/Plan Principal Problem:   Abdominal pain Active Problems:   LFT elevation   Epigastric abdominal pain    1. Epigastric pain with elevated LFTs with recent cholecystectomy -concerning for CBD obstruction.  MRCP has been ordered we will keep patient n.p.o. and IV fluids pain relief medication repeat labs including LFTs and lipase.  Consult gastroenterologist in the morning. 2. Leukocytosis -has been persistently elevated W BC count which is worsened now.  Patient is afebrile.  We will closely monitor. 3. Previous history of ischemic colitis.  CT abdomen pelvis was unremarkable.   DVT prophylaxis: SCDs in anticipation of procedure. Code Status: Full code. Family Communication: Discussed with patient husband. Disposition Plan: Home. Consults called: None. Admission status: Observation.   Eduard Clos MD [facility] Pager [phone].  If 7PM-7AM, please contact night-coverage [email or web address] Password [facility]  03/04/2018, 2:33 AM

## 2018-03-05 ENCOUNTER — Observation Stay (HOSPITAL_COMMUNITY): Payer: BLUE CROSS/BLUE SHIELD

## 2018-03-05 DIAGNOSIS — J45909 Unspecified asthma, uncomplicated: Secondary | ICD-10-CM | POA: Diagnosis present

## 2018-03-05 DIAGNOSIS — R7989 Other specified abnormal findings of blood chemistry: Secondary | ICD-10-CM | POA: Diagnosis not present

## 2018-03-05 DIAGNOSIS — R945 Abnormal results of liver function studies: Secondary | ICD-10-CM

## 2018-03-05 DIAGNOSIS — R1013 Epigastric pain: Secondary | ICD-10-CM | POA: Diagnosis not present

## 2018-03-05 DIAGNOSIS — Z79899 Other long term (current) drug therapy: Secondary | ICD-10-CM | POA: Diagnosis not present

## 2018-03-05 DIAGNOSIS — Z8619 Personal history of other infectious and parasitic diseases: Secondary | ICD-10-CM | POA: Diagnosis not present

## 2018-03-05 DIAGNOSIS — D649 Anemia, unspecified: Secondary | ICD-10-CM | POA: Diagnosis present

## 2018-03-05 DIAGNOSIS — K7689 Other specified diseases of liver: Secondary | ICD-10-CM | POA: Diagnosis not present

## 2018-03-05 DIAGNOSIS — R112 Nausea with vomiting, unspecified: Secondary | ICD-10-CM | POA: Diagnosis not present

## 2018-03-05 DIAGNOSIS — D72829 Elevated white blood cell count, unspecified: Secondary | ICD-10-CM | POA: Diagnosis not present

## 2018-03-05 LAB — CBC WITH DIFFERENTIAL/PLATELET
Abs Immature Granulocytes: 0.03 10*3/uL (ref 0.00–0.07)
Basophils Absolute: 0.1 10*3/uL (ref 0.0–0.1)
Basophils Relative: 1 %
Eosinophils Absolute: 0.6 10*3/uL — ABNORMAL HIGH (ref 0.0–0.5)
Eosinophils Relative: 9 %
HCT: 32.8 % — ABNORMAL LOW (ref 36.0–46.0)
Hemoglobin: 10.5 g/dL — ABNORMAL LOW (ref 12.0–15.0)
Immature Granulocytes: 1 %
Lymphocytes Relative: 12 %
Lymphs Abs: 0.8 10*3/uL (ref 0.7–4.0)
MCH: 27.9 pg (ref 26.0–34.0)
MCHC: 32 g/dL (ref 30.0–36.0)
MCV: 87 fL (ref 80.0–100.0)
Monocytes Absolute: 0.5 10*3/uL (ref 0.1–1.0)
Monocytes Relative: 9 %
Neutro Abs: 4.3 10*3/uL (ref 1.7–7.7)
Neutrophils Relative %: 68 %
Platelets: 311 10*3/uL (ref 150–400)
RBC: 3.77 MIL/uL — ABNORMAL LOW (ref 3.87–5.11)
RDW: 13.8 % (ref 11.5–15.5)
WBC: 6.3 10*3/uL (ref 4.0–10.5)
nRBC: 0 % (ref 0.0–0.2)

## 2018-03-05 LAB — HEPATITIS PANEL, ACUTE
HCV Ab: <0.1 s/co ratio (ref 0.0–0.9)
Hep A IgM: NEGATIVE
Hep B C IgM: NEGATIVE
Hepatitis B Surface Ag: NEGATIVE

## 2018-03-05 LAB — FERRITIN: Ferritin: 113 ng/mL (ref 11–307)

## 2018-03-05 LAB — COMPREHENSIVE METABOLIC PANEL
ALT: 212 U/L — ABNORMAL HIGH (ref 0–44)
AST: 202 U/L — ABNORMAL HIGH (ref 15–41)
Albumin: 3.1 g/dL — ABNORMAL LOW (ref 3.5–5.0)
Alkaline Phosphatase: 150 U/L — ABNORMAL HIGH (ref 38–126)
Anion gap: 8 (ref 5–15)
BUN: 5 mg/dL — ABNORMAL LOW (ref 6–20)
CO2: 23 mmol/L (ref 22–32)
Calcium: 8.1 mg/dL — ABNORMAL LOW (ref 8.9–10.3)
Chloride: 108 mmol/L (ref 98–111)
Creatinine, Ser: 0.62 mg/dL (ref 0.44–1.00)
GFR calc Af Amer: >60 mL/min (ref >60)
GFR calc non Af Amer: >60 mL/min (ref >60)
Glucose, Bld: 96 mg/dL (ref 70–99)
Potassium: 3.5 mmol/L (ref 3.5–5.1)
Sodium: 139 mmol/L (ref 135–145)
Total Bilirubin: 5.1 mg/dL — ABNORMAL HIGH (ref 0.3–1.2)
Total Protein: 5.9 g/dL — ABNORMAL LOW (ref 6.5–8.1)

## 2018-03-05 LAB — SEDIMENTATION RATE: Sed Rate: 23 mm/hr — ABNORMAL HIGH (ref 0–22)

## 2018-03-05 LAB — PROTIME-INR
INR: 0.99
Prothrombin Time: 13 seconds (ref 11.4–15.2)

## 2018-03-05 LAB — FOLATE: Folate: 15.3 ng/mL (ref >5.9)

## 2018-03-05 LAB — IRON AND TIBC
Iron: 42 ug/dL (ref 28–170)
Saturation Ratios: 12 % (ref 10.4–31.8)
TIBC: 338 ug/dL (ref 250–450)
UIBC: 296 ug/dL

## 2018-03-05 LAB — CERULOPLASMIN: Ceruloplasmin: 39 mg/dL (ref 19.0–39.0)

## 2018-03-05 LAB — C-REACTIVE PROTEIN: CRP: 2.4 mg/dL — ABNORMAL HIGH (ref <1.0)

## 2018-03-05 LAB — VITAMIN B12: Vitamin B-12: 598 pg/mL (ref 180–914)

## 2018-03-05 LAB — MITOCHONDRIAL ANTIBODIES: Mitochondrial M2 Ab, IgG: <20 Units (ref 0.0–20.0)

## 2018-03-05 LAB — ALPHA-1-ANTITRYPSIN: A-1 Antitrypsin, Ser: 169 mg/dL (ref 101–187)

## 2018-03-05 LAB — ANTI-SMOOTH MUSCLE ANTIBODY, IGG: F-Actin IgG: 6 Units (ref 0–19)

## 2018-03-05 MED ORDER — SODIUM CHLORIDE 0.9 % IV SOLN: INTRAVENOUS | Status: DC

## 2018-03-05 MED ORDER — TECHNETIUM TC 99M MEBROFENIN IV KIT
7.6000 | PACK | Freq: Once | INTRAVENOUS | Status: AC | PRN
  Administered 2018-03-05: 7.6 via INTRAVENOUS

## 2018-03-05 MED ORDER — SODIUM CHLORIDE 0.9 % IV SOLN
INTRAVENOUS | Status: DC | PRN
  Administered 2018-03-07: 500 mL via INTRAVENOUS

## 2018-03-05 NOTE — Final Consult Note (Signed)
[facility] Progress Note     Subjective: CC: epigastric pain Patient with milder epigastric pain today. Denies nausea, tolerating liquids. Patient reports that she notices her stomach seems to churn every time she eats regardless of what she eats or how much and this has been going on for years. Patient also reported that she remembers prior to having her gallbladder out she at a burger with a friend who ate the same thing and friend also reported feeling not well after. HIDA negative for leak, and hep panel negative. Patient also reports that her mother had lupus.   Objective: Vital signs in last 24 hours: Temp:  [98.2 F (36.8 C)-99.2 F (37.3 C)] 98.2 F (36.8 C) (11/14 0420) Pulse Rate:  [64-68] 64 (11/14 0420) Resp:  [16-20] 16 (11/14 0420) BP: (112-122)/(64-80) 112/64 (11/14 0420) SpO2:  [99 %-100 %] 99 % (11/14 0420) Last BM Date: 03/03/18  Intake/Output from previous day: 11/13 0701 - 11/14 0700 In: 2428.9 [P.O.:420; I.V.:2008.9] Out: 3075 [Urine:3075] Intake/Output this shift: No intake/output data recorded.  PE: Gen:  Alert, NAD, pleasant Card:  Regular rate and rhythm Pulm:  Normal effort, clear to auscultation bilaterally Abd: Soft, mild epigastric TTP, non-distended, bowel sounds present, no HSM, no hernia, negative murphy sign  Skin: warm and dry, no rashes  Psych: A&Ox3   Lab Results:  Recent Labs    03/04/18 0328 03/05/18 0341  WBC 17.6* 6.3  HGB 10.7* 10.5*  HCT 33.4* 32.8*  PLT 356 311   BMET Recent Labs    03/04/18 1121 03/05/18 0341  NA 138 139  K 3.5 3.5  CL 106 108  CO2 22 23  GLUCOSE 109* 96  BUN 8 5*  CREATININE 0.63 0.62  CALCIUM 8.1* 8.1*   PT/INR No results for input(s): LABPROT, INR in the last 72 hours. CMP     Component Value Date/Time   NA 139 03/05/2018 0341   K 3.5 03/05/2018 0341   CL 108 03/05/2018 0341   CO2 23 03/05/2018 0341   GLUCOSE 96 03/05/2018 0341   BUN 5 (L) 03/05/2018 0341   CREATININE  0.62 03/05/2018 0341   CALCIUM 8.1 (L) 03/05/2018 0341   PROT 5.9 (L) 03/05/2018 0341   ALBUMIN 3.1 (L) 03/05/2018 0341   AST 202 (H) 03/05/2018 0341   ALT 212 (H) 03/05/2018 0341   ALKPHOS 150 (H) 03/05/2018 0341   BILITOT 5.1 (H) 03/05/2018 0341   GFRNONAA >60 03/05/2018 0341   GFRAA >60 03/05/2018 0341   Lipase     Component Value Date/Time   LIPASE 23 03/04/2018 0328       Studies/Results: Ct Abdomen Pelvis W Contrast  Result Date: 03/03/2018 CLINICAL DATA:  Mid abdominal pain with nausea and vomiting. History of laparoscopic cholecystectomy on 02/16/2018 for cholelithiasis and biliary pancreatitis. EXAM: CT ABDOMEN AND PELVIS WITH CONTRAST TECHNIQUE: Multidetector CT imaging of the abdomen and pelvis was performed using the standard protocol following bolus administration of intravenous contrast. CONTRAST:  100mL ISOVUE-300 IOPAMIDOL (ISOVUE-300) INJECTION 61% COMPARISON:  02/13/2018 FINDINGS: Lower chest: No acute abnormality. Hepatobiliary: No focal liver abnormality is seen. Status post cholecystectomy. No biliary dilatation. No abnormal fluid collections are identified post cholecystectomy. Cystic duct clips are normally positioned and show no obvious evidence of migration by CT. Pancreas: Pancreatic inflammation has resolved since the prior CT. No mass or pancreatic ductal dilatation. No evidence of pseudocyst. Spleen: Normal in size without focal abnormality. Adrenals/Urinary Tract: Adrenal glands are unremarkable. Kidneys are normal, without renal calculi,  focal lesion, or hydronephrosis. Bladder is unremarkable. Stomach/Bowel: No evidence of bowel obstruction, ileus or inflammation. No free air. No bowel lesions identified. Vascular/Lymphatic: No significant vascular findings are present. No enlarged abdominal or pelvic lymph nodes. Reproductive: Uterus and bilateral adnexa are unremarkable. Other: No abdominal wall hernia or abnormality. No focal abscess or ascites.  Musculoskeletal: Stable degenerative disc disease at L5-S1. IMPRESSION: No acute findings or complications evident after recent cholecystectomy. Previously noted pancreatic inflammation has resolved by imaging. Electronically Signed   By: Irish Lack M.D.   On: 03/03/2018 16:29   Mr 3d Recon At Scanner  Result Date: 03/04/2018 CLINICAL DATA:  Severe upper abdominal pain, status post cholecystectomy 2 weeks ago, elevated LFTs EXAM: MRI ABDOMEN WITHOUT AND WITH CONTRAST (INCLUDING MRCP) TECHNIQUE: Multiplanar multisequence MR imaging of the abdomen was performed both before and after the administration of intravenous contrast. Heavily T2-weighted images of the biliary and pancreatic ducts were obtained, and three-dimensional MRCP images were rendered by post processing. CONTRAST:  6 mL Gadovist IV COMPARISON:  CT abdomen/pelvis dated 03/03/2018. MRI abdomen dated 02/14/2018. FINDINGS: Lower chest: Lung bases are clear. Hepatobiliary: Liver is within normal limits. No suspicious/enhancing hepatic lesions. No hepatic steatosis. Status post cholecystectomy. No fluid collection in the gallbladder fossa. No intrahepatic or extrahepatic ductal dilatation. No choledocholithiasis is seen. Pancreas: Within normal limits. No peripancreatic inflammatory changes. Spleen:  Within normal limits. Adrenals/Urinary Tract:  Adrenal glands are within normal limits. Kidneys are within normal limits.  No hydronephrosis. Stomach/Bowel: Stomach is within normal limits. Visualized bowel is unremarkable. Vascular/Lymphatic:  No evidence of abdominal aortic aneurysm. No suspicious abdominal lymphadenopathy. Other:  No abdominal ascites. Musculoskeletal: No focal osseous lesions. IMPRESSION: Status post cholecystectomy. No fluid collection in the gallbladder fossa. No intrahepatic or extrahepatic ductal dilatation. No choledocholithiasis is seen. No peripancreatic inflammatory changes on MRI. Electronically Signed   By: Charline Bills M.D.   On: 03/04/2018 10:07   Mr Abdomen Mrcp Vivien Rossetti Contast  Result Date: 03/04/2018 CLINICAL DATA:  Severe upper abdominal pain, status post cholecystectomy 2 weeks ago, elevated LFTs EXAM: MRI ABDOMEN WITHOUT AND WITH CONTRAST (INCLUDING MRCP) TECHNIQUE: Multiplanar multisequence MR imaging of the abdomen was performed both before and after the administration of intravenous contrast. Heavily T2-weighted images of the biliary and pancreatic ducts were obtained, and three-dimensional MRCP images were rendered by post processing. CONTRAST:  6 mL Gadovist IV COMPARISON:  CT abdomen/pelvis dated 03/03/2018. MRI abdomen dated 02/14/2018. FINDINGS: Lower chest: Lung bases are clear. Hepatobiliary: Liver is within normal limits. No suspicious/enhancing hepatic lesions. No hepatic steatosis. Status post cholecystectomy. No fluid collection in the gallbladder fossa. No intrahepatic or extrahepatic ductal dilatation. No choledocholithiasis is seen. Pancreas: Within normal limits. No peripancreatic inflammatory changes. Spleen:  Within normal limits. Adrenals/Urinary Tract:  Adrenal glands are within normal limits. Kidneys are within normal limits.  No hydronephrosis. Stomach/Bowel: Stomach is within normal limits. Visualized bowel is unremarkable. Vascular/Lymphatic:  No evidence of abdominal aortic aneurysm. No suspicious abdominal lymphadenopathy. Other:  No abdominal ascites. Musculoskeletal: No focal osseous lesions. IMPRESSION: Status post cholecystectomy. No fluid collection in the gallbladder fossa. No intrahepatic or extrahepatic ductal dilatation. No choledocholithiasis is seen. No peripancreatic inflammatory changes on MRI. Electronically Signed   By: Charline Bills M.D.   On: 03/04/2018 10:07    Anti-infectives: Anti-infectives (From admission, onward)   Start     Dose/Rate Route Frequency Ordered Stop   03/05/18 0600  piperacillin-tazobactam (ZOSYN) IVPB 3.375 g     3.375 g  12.5 mL/hr over  240 Minutes Intravenous Every 8 hours 03/04/18 1855     03/04/18 1915  piperacillin-tazobactam (ZOSYN) IVPB 3.375 g     3.375 g 100 mL/hr over 30 Minutes Intravenous  Once 03/04/18 1841 03/04/18 1917       Assessment/Plan Hx of ischemic colitis Hx of hepatitis - age 45 or 56, unsure whether it was A/B/C/E  Hx of gallstone pancreatitis - s/p laparoscopic cholecystectomy with negative IOC 02/16/18 Dr. Gerrit Friends  - CT 11/12: no collection in gallbladder fossa and resolution in pancreatic inflammation  - MRCP negative for choledocholithiasis - AST/ALT 202/212, Tbili 5.1, WBC 6 - HIDA negative - hepatitis panel negative, alpha-1-antitrypsin neg, ANCA and other antibody labs pending - GI following - further workup per GI - no role for surgery at this point, we will sign off. Please call if we can be of assistance otherwise  FEN: reg VTE: SCDs ID: [email or web address] 11/13>>  LOS: 0 days    Wells Guiles , [facility] 03/05/2018, 9:34 AM Pager: [phone] Consults: [phone] Mon-Fri 7:00 am-4:30 pm Sat-Sun 7:00 am-11:30 am

## 2018-03-05 NOTE — Progress Notes (Signed)
PROGRESS NOTE  [name] WIO:[id number] DOB: [birth year] DOA: 03/03/2018 PCP: Darlene Jordan, MD  HPI/Recap of past 24 hours: HPI from Dr [name] is a 47 y.o. female with history of gallstone pancreatitis recently admitted and cholecystectomy done about 3 weeks ago, started developing abdominal pain mostly in the epigastric area with multiple episodes of nausea, vomiting for 1 day. Had a general surgeon's follow up appt, was given pain relief medication and instructed to go to the ER if pain worsens. Since the pain worsened patient went to [place].  Denies any fever chills diarrhea or any blood in the vomitus.  Denies chest pain or shortness of breath. In the ER patient had blood work done which shows leukocytosis with AST of 365 ALT of 205 total bilirubin 4.1.  CT abdomen pelvis was unremarkable.  Patient admitted for further management. GI consulted   Today, pt denies any worsening epigastric pain, denies N/V, fever/chills, chest pain, SOB.  Assessment/Plan: Principal Problem:   Abdominal pain Active Problems:   LFT elevation   Epigastric abdominal pain  Epigastric pain with elevated LFTs with recent cholecystectomy AST 335, ALT 247, T.bili 6.4, ALK 142, lipase WNL LFTs trending down except ALK Hepatitis w/u including viral hep panel, ceruloplasmin, alpha-1 antitrypsin, all unremarkable, further work-up pending CT abdomen/pelvis: No acute findings or complications evident after recent cholecystectomy. Previously noted pancreatic inflammation has resolved by imaging MRCP showed: Status post cholecystectomy. No fluid collection in the gallbladder fossa. No intrahepatic or extrahepatic ductal dilatation. No choledocholithiasis is seen. No peripancreatic inflammatory changes on MRI HIDA scan showed no evidence of biliary obstruction or bile leak. Continue pain control GI on board: May require EUS, liver biopsy for now. GI started IV Zosyn on  03/04/2018 Daily CMP Monitor closely  Leukocytosis Resolved, since after IV Zosyn ?chronic, since 01/2018, even in 2009 Unclear etiology for now Daily CBC to monitor trend  Normocytic anemia of unclear etiology Baseline around 12 Anemia panel: Iron 42, TIBC 338, sats 12%, ferritin 113, folate 15.3, vitamin B12 598 Daily cbc  Previous hx of ischemic colitis CT abdomen pelvis unremarkable    Code Status: Full  Family Communication: Husband at bedside  Disposition Plan: Once work-up complete/stable   Consultants:  GI  Procedures:  None  Antimicrobials:  IV Zosyn  DVT prophylaxis: SCD in anticipation of procedure   Objective: Vitals:   03/04/18 1444 03/04/18 2202 03/05/18 0420 03/05/18 1046  BP: 113/80 122/68 112/64 126/78  Pulse: 68 67 64 64  Resp: '19 20 16 17  ' Temp: 99.2 F (37.3 C) 98.7 F (37.1 C) 98.2 F (36.8 C) 98.9 F (37.2 C)  TempSrc: Oral Oral Oral Oral  SpO2: 100% 99% 99% 99%  Weight:      Height:        Intake/Output Summary (Last 24 hours) at 03/05/2018 1141 Last data filed at 03/05/2018 1052 Gross per 24 hour  Intake 2428.91 ml  Output 3475 ml  Net -1046.09 ml   Filed Weights   03/03/18 2113  Weight: 59.8 kg    Exam:  General: NAD   Cardiovascular: S1, S2 present  Respiratory: CTAB  Abdomen: Soft, nontender, nondistended, bowel sounds present  Musculoskeletal: No bilateral pedal edema noted  Skin: Normal  Psychiatry: Normal mood   Data Reviewed: CBC: Recent Labs  Lab 03/03/18 2130 03/04/18 0328 03/05/18 0341  WBC 27.9* 17.6* 6.3  NEUTROABS  --   --  4.3  HGB 12.6 10.7* 10.5*  HCT 38.7 33.4* 32.8*  MCV 87.0 87.4 87.0  PLT 400 356 979   Basic Metabolic Panel: Recent Labs  Lab 03/03/18 2130 03/04/18 0328 03/04/18 1121 03/05/18 0341  NA 136 140 138 139  K 3.5 3.6 3.5 3.5  CL 101 108 106 108  CO2 '24 25 22 23  ' GLUCOSE 143* 114* 109* 96  BUN '10 8 8 ' 5*  CREATININE 0.58 0.70 0.63 0.62  CALCIUM 9.0  8.3* 8.1* 8.1*   GFR: Estimated Creatinine Clearance: 79.9 mL/min (by C-G formula based on SCr of 0.62 mg/dL). Liver Function Tests: Recent Labs  Lab 03/03/18 2130 03/04/18 0328 03/04/18 1121 03/05/18 0341  AST 368* 350* 335* 202*  ALT 205* 219* 247* 212*  ALKPHOS 121 116 142* 150*  BILITOT 4.1* 4.4* 6.4* 5.1*  PROT 7.5 6.3* 6.5 5.9*  ALBUMIN 3.9 3.4* 3.4* 3.1*   Recent Labs  Lab 03/03/18 2130 03/04/18 0328  LIPASE 24 23   No results for input(s): AMMONIA in the last 168 hours. Coagulation Profile: Recent Labs  Lab 03/05/18 1100  INR 0.99   Cardiac Enzymes: No results for input(s): CKTOTAL, CKMB, CKMBINDEX, TROPONINI in the last 168 hours. BNP (last 3 results) No results for input(s): PROBNP in the last 8760 hours. HbA1C: No results for input(s): HGBA1C in the last 72 hours. CBG: No results for input(s): GLUCAP in the last 168 hours. Lipid Profile: No results for input(s): CHOL, HDL, LDLCALC, TRIG, CHOLHDL, LDLDIRECT in the last 72 hours. Thyroid Function Tests: No results for input(s): TSH, T4TOTAL, FREET4, T3FREE, THYROIDAB in the last 72 hours. Anemia Panel: Recent Labs    03/04/18 1724 03/05/18 0341  VITAMINB12  --  598  FOLATE  --  15.3  FERRITIN  --  113  TIBC 379 338  IRON 31 42   Urine analysis:    Component Value Date/Time   COLORURINE YELLOW 03/03/2018 2338   APPEARANCEUR CLEAR 03/03/2018 2338   LABSPEC 1.015 03/03/2018 2338   PHURINE 6.5 03/03/2018 2338   GLUCOSEU NEGATIVE 03/03/2018 2338   HGBUR TRACE (A) 03/03/2018 2338   BILIRUBINUR MODERATE (A) 03/03/2018 2338   KETONESUR NEGATIVE 03/03/2018 2338   PROTEINUR NEGATIVE 03/03/2018 2338   UROBILINOGEN 0.2 04/25/2007 1418   NITRITE NEGATIVE 03/03/2018 2338   LEUKOCYTESUR NEGATIVE 03/03/2018 2338   Sepsis Labs: '@LABRCNTIP' (procalcitonin:4,lacticidven:4)  )No results found for this or any previous visit (from the past 240 hour(s)).    Studies: Nm Hepatobiliary Liver Func  Result  Date: 03/05/2018 CLINICAL DATA:  Laparoscopic cholecystectomy 02/16/2018. Abdominal pain. Elevated bilirubin. EXAM: NUCLEAR MEDICINE HEPATOBILIARY IMAGING TECHNIQUE: Sequential images of the abdomen were obtained out to 60 minutes following intravenous administration of radiopharmaceutical. RADIOPHARMACEUTICALS:  7.6 mCi Tc-30m Choletec IV COMPARISON:  MRI 03/04/2018.  CT 03/03/2018 FINDINGS: Prompt uptake and biliary excretion of activity by the liver is seen. There is normal drainage of the common bile duct into the small bowel. No evidence of biliary obstruction or bowel leak. IMPRESSION: No evidence of biliary obstruction or bile leak. Electronically Signed   By: KRolm BaptiseM.D.   On: 03/05/2018 10:40    Scheduled Meds:  Continuous Infusions: . piperacillin-tazobactam (ZOSYN)  IV 3.375 g (03/05/18 08921     LOS: 0 days     NAlma Friendly MD [facility]   If 7PM-7AM, please contact night-coverage [email or web address] 03/05/2018, 11:41 AM

## 2018-03-05 NOTE — Progress Notes (Addendum)
Progress Note   Subjective  Chief Complaint: Elevated LFTs, epigastric pain, status post lap choley 10/28  Today, patient reports that she did have her epigastric pain return some today, but "nothing like it was". Her husband is by her side and does ask some more questions today. At the mention of possible autoimmune causes he tells me that SLE runs in her family. No new concerns or symptoms.   Objective   Vital signs in last 24 hours: Temp:  [98.2 F (36.8 C)-99.2 F (37.3 C)] 98.9 F (37.2 C) (11/14 1046) Pulse Rate:  [64-68] 64 (11/14 1046) Resp:  [16-20] 17 (11/14 1046) BP: (112-126)/(64-80) 126/78 (11/14 1046) SpO2:  [99 %-100 %] 99 % (11/14 1046) Last BM Date: 03/03/18 General:    Female in NAD Heart:  Regular rate and rhythm; no murmurs Lungs: Respirations even and unlabored, lungs CTA bilaterally Abdomen:  Soft, mild epigastric ttp and nondistended. Normal bowel sounds. Psych:  Cooperative. Normal mood and affect.  Intake/Output from previous day: 11/13 0701 - 11/14 0700 In: 2428.9 [P.O.:420; I.V.:2008.9] Out: 3075 [Urine:3075] Intake/Output this shift: Total I/O In: -  Out: 700 [Urine:700]  Lab Results: Recent Labs    03/03/18 2130 03/04/18 0328 03/05/18 0341  WBC 27.9* 17.6* 6.3  HGB 12.6 10.7* 10.5*  HCT 38.7 33.4* 32.8*  PLT 400 356 311   BMET Recent Labs    03/04/18 0328 03/04/18 1121 03/05/18 0341  NA 140 138 139  K 3.6 3.5 3.5  CL 108 106 108  CO2 '25 22 23  ' GLUCOSE 114* 109* 96  BUN 8 8 5*  CREATININE 0.70 0.63 0.62  CALCIUM 8.3* 8.1* 8.1*   LFT Recent Labs    03/04/18 0328  03/05/18 0341  PROT 6.3*   < > 5.9*  ALBUMIN 3.4*   < > 3.1*  AST 350*   < > 202*  ALT 219*   < > 212*  ALKPHOS 116   < > 150*  BILITOT 4.4*   < > 5.1*  BILIDIR 2.5*  --   --   IBILI 1.9*  --   --    < > = values in this interval not displayed.    Studies/Results: Nm Hepatobiliary Liver Func  Result Date: 03/05/2018 CLINICAL DATA:  Laparoscopic  cholecystectomy 02/16/2018. Abdominal pain. Elevated bilirubin. EXAM: NUCLEAR MEDICINE HEPATOBILIARY IMAGING TECHNIQUE: Sequential images of the abdomen were obtained out to 60 minutes following intravenous administration of radiopharmaceutical. RADIOPHARMACEUTICALS:  7.6 mCi Tc-53m Choletec IV COMPARISON:  MRI 03/04/2018.  CT 03/03/2018 FINDINGS: Prompt uptake and biliary excretion of activity by the liver is seen. There is normal drainage of the common bile duct into the small bowel. No evidence of biliary obstruction or bowel leak. IMPRESSION: No evidence of biliary obstruction or bile leak. Electronically Signed   By: KRolm BaptiseM.D.   On: 03/05/2018 10:40   Ct Abdomen Pelvis W Contrast  Result Date: 03/03/2018 CLINICAL DATA:  Mid abdominal pain with nausea and vomiting. History of laparoscopic cholecystectomy on 02/16/2018 for cholelithiasis and biliary pancreatitis. EXAM: CT ABDOMEN AND PELVIS WITH CONTRAST TECHNIQUE: Multidetector CT imaging of the abdomen and pelvis was performed using the standard protocol following bolus administration of intravenous contrast. CONTRAST:  1057mISOVUE-300 IOPAMIDOL (ISOVUE-300) INJECTION 61% COMPARISON:  02/13/2018 FINDINGS: Lower chest: No acute abnormality. Hepatobiliary: No focal liver abnormality is seen. Status post cholecystectomy. No biliary dilatation. No abnormal fluid collections are identified post cholecystectomy. Cystic duct clips are normally positioned and show no  obvious evidence of migration by CT. Pancreas: Pancreatic inflammation has resolved since the prior CT. No mass or pancreatic ductal dilatation. No evidence of pseudocyst. Spleen: Normal in size without focal abnormality. Adrenals/Urinary Tract: Adrenal glands are unremarkable. Kidneys are normal, without renal calculi, focal lesion, or hydronephrosis. Bladder is unremarkable. Stomach/Bowel: No evidence of bowel obstruction, ileus or inflammation. No free air. No bowel lesions identified.  Vascular/Lymphatic: No significant vascular findings are present. No enlarged abdominal or pelvic lymph nodes. Reproductive: Uterus and bilateral adnexa are unremarkable. Other: No abdominal wall hernia or abnormality. No focal abscess or ascites. Musculoskeletal: Stable degenerative disc disease at L5-S1. IMPRESSION: No acute findings or complications evident after recent cholecystectomy. Previously noted pancreatic inflammation has resolved by imaging. Electronically Signed   By: Aletta Edouard M.D.   On: 03/03/2018 16:29   Mr 3d Recon At Scanner  Result Date: 03/04/2018 CLINICAL DATA:  Severe upper abdominal pain, status post cholecystectomy 2 weeks ago, elevated LFTs EXAM: MRI ABDOMEN WITHOUT AND WITH CONTRAST (INCLUDING MRCP) TECHNIQUE: Multiplanar multisequence MR imaging of the abdomen was performed both before and after the administration of intravenous contrast. Heavily T2-weighted images of the biliary and pancreatic ducts were obtained, and three-dimensional MRCP images were rendered by post processing. CONTRAST:  6 mL Gadovist IV COMPARISON:  CT abdomen/pelvis dated 03/03/2018. MRI abdomen dated 02/14/2018. FINDINGS: Lower chest: Lung bases are clear. Hepatobiliary: Liver is within normal limits. No suspicious/enhancing hepatic lesions. No hepatic steatosis. Status post cholecystectomy. No fluid collection in the gallbladder fossa. No intrahepatic or extrahepatic ductal dilatation. No choledocholithiasis is seen. Pancreas: Within normal limits. No peripancreatic inflammatory changes. Spleen:  Within normal limits. Adrenals/Urinary Tract:  Adrenal glands are within normal limits. Kidneys are within normal limits.  No hydronephrosis. Stomach/Bowel: Stomach is within normal limits. Visualized bowel is unremarkable. Vascular/Lymphatic:  No evidence of abdominal aortic aneurysm. No suspicious abdominal lymphadenopathy. Other:  No abdominal ascites. Musculoskeletal: No focal osseous lesions. IMPRESSION:  Status post cholecystectomy. No fluid collection in the gallbladder fossa. No intrahepatic or extrahepatic ductal dilatation. No choledocholithiasis is seen. No peripancreatic inflammatory changes on MRI. Electronically Signed   By: Julian Hy M.D.   On: 03/04/2018 10:07   Mr Abdomen Mrcp Moise Boring Contast  Result Date: 03/04/2018 CLINICAL DATA:  Severe upper abdominal pain, status post cholecystectomy 2 weeks ago, elevated LFTs EXAM: MRI ABDOMEN WITHOUT AND WITH CONTRAST (INCLUDING MRCP) TECHNIQUE: Multiplanar multisequence MR imaging of the abdomen was performed both before and after the administration of intravenous contrast. Heavily T2-weighted images of the biliary and pancreatic ducts were obtained, and three-dimensional MRCP images were rendered by post processing. CONTRAST:  6 mL Gadovist IV COMPARISON:  CT abdomen/pelvis dated 03/03/2018. MRI abdomen dated 02/14/2018. FINDINGS: Lower chest: Lung bases are clear. Hepatobiliary: Liver is within normal limits. No suspicious/enhancing hepatic lesions. No hepatic steatosis. Status post cholecystectomy. No fluid collection in the gallbladder fossa. No intrahepatic or extrahepatic ductal dilatation. No choledocholithiasis is seen. Pancreas: Within normal limits. No peripancreatic inflammatory changes. Spleen:  Within normal limits. Adrenals/Urinary Tract:  Adrenal glands are within normal limits. Kidneys are within normal limits.  No hydronephrosis. Stomach/Bowel: Stomach is within normal limits. Visualized bowel is unremarkable. Vascular/Lymphatic:  No evidence of abdominal aortic aneurysm. No suspicious abdominal lymphadenopathy. Other:  No abdominal ascites. Musculoskeletal: No focal osseous lesions. IMPRESSION: Status post cholecystectomy. No fluid collection in the gallbladder fossa. No intrahepatic or extrahepatic ductal dilatation. No choledocholithiasis is seen. No peripancreatic inflammatory changes on MRI. Electronically Signed   By: Bertis Ruddy  [place] Pink M.D.   On: 03/04/2018 10:07       Assessment / Plan:   Assessment: 1.  Epigastric pain with elevated LFTs: s/p cholecystectomy 10/28, most LFTs continue to increase over the past 48 hours, bili might be starting to trend down now as well as AST and ALT, but Alk phos is increasing, MRCP, CT and HIDA scan all negative 2.  Leukocytosis: Improved overnight after antibiotic initiation  Plan: 1.  Continue to trend LFTs 2.  Ordered extra testing today including pANCA, cANCA, CRp, ESR and PT/INR 3.  Other liver serologies are pending 4.  Patient may need liver biopsy, this will be discussed with her today and possibly arrange for tomorrow. 5.  Please await further recommendations from Dr. Lyndel Safe later today  Thank you for your kind consultation, we will continue to follow.   LOS: 0 days   Levin Erp  03/05/2018, 11:21 AM   Attending physician's note   I have taken an interval history, reviewed the chart and examined the patient. I agree with the Advanced Practitioner's note, impression and recommendations. Have discussed with Dr. Ardis Hughs.  Discussed in detail with the patient and patient's husband.  I have also shown them IOC films, CT films, MRI scan and HIDA scan.  47 year old with painful obstructive jaundice and abn LFTs after uneventful lap chole with good quality IOC d/t biliary pancreatitis on 02/16/2018 (Nl LFTs at that time). Neg post-op CT, MRCP/MRI for leak, biliary dilatation, intra-abdominal fluid collection or pancreatitis. Has increased WBC count but no fever/chills s/o ascending cholangitis. Neg HIDA scan today.  Liver function tests are trending down. D/d includes contrast-induced cholangiopathy/cholangitis, small duct PSC or unlikely to be a small CBD stone.  Plan:  -Follow hepatitis [facility]U -Liver biopsy in a.m. -Trend LFTs, CBC -Continue antibiotics. -If not better, short course of steroids. -If still not better, EUS. If EUS is positive for stone and/or  depending upon the clinical course and labs, will consider ERCP. At this time, we have decided to hold off on ERCP as duct not dilated, negative MRCP, CT and above tests.  Carmell Austria, MD

## 2018-03-05 NOTE — Progress Notes (Signed)
"  I have reviewed and concur with this patient's documentation."

## 2018-03-05 NOTE — Consult Note (Addendum)
Chief Complaint: Patient was seen in consultation today for image guided random core liver biopsy Chief Complaint  Patient presents with  . Abdominal Pain    Referring Physician(s): Gupta,R  Supervising Physician: Malachy Moan  Patient Status: [facility] - In-pt  History of Present Illness: [name] is a 47 y.o. female with history of asthma, IBS, prior ischemic colitis and gallstone pancreatitis/chronic cholecystitis status post laparoscopic cholecystectomy on 02/16/2018.  She was recently admitted with multiple episodes of nausea and vomiting along with persistent epigastric discomfort and elevated liver function tests/leukocytosis.  Leukocytosis has since normalized but LFTs remain elevated with total bilirubin 5.1.  MRI abdomen performed yesterday revealed no apparent abnormality.  Request now received for image guided random core liver biopsy for further evaluation.  Past Medical History:  Diagnosis Date  . Allergic rhinitis   . Asthma   . Gallstones 1/09   on CT  . IBS (irritable bowel syndrome)   . Ischemic colitis (HCC) 1/09  . Jejunal intussusception (HCC) 1/09   on CT    Past Surgical History:  Procedure Laterality Date  . CHOLECYSTECTOMY N/A 02/16/2018   Procedure: LAPAROSCOPIC CHOLECYSTECTOMY WITH INTRAOPERATIVE CHOLANGIOGRAM;  Surgeon: Darnell Level, MD;  Location: [facility] ORS;  Service: General;  Laterality: N/A;  . COLONOSCOPY  1/09    Allergies: Patient has no known allergies.  Medications: Prior to Admission medications   Medication Sig Start Date End Date Taking? Authorizing Provider  amoxicillin-clavulanate (AUGMENTIN) 875-125 MG tablet Take 1 tablet by mouth 2 (two) times daily.   Yes [provider]  calcium-vitamin D (OSCAL WITH D) 500-200 MG-UNIT tablet Take 2 tablets by mouth daily with breakfast.   Yes [provider]  cetirizine (ZYRTEC) 10 MG tablet Take 10 mg by mouth daily.   Yes [provider]  cyclobenzaprine  (FLEXERIL) 10 MG tablet Take 10 mg by mouth 3 (three) times daily as needed for muscle spasms.  04/01/13  Yes [provider]  diclofenac (VOLTAREN) 75 MG EC tablet Take 75 mg by mouth 2 (two) times daily as needed for mild pain.    Yes [provider]  fexofenadine (ALLEGRA) 180 MG tablet Take 180 mg by mouth daily.   Yes [provider]  GLUCOSAMINE-CHONDROITIN PO Take 1 tablet by mouth daily.   Yes [provider]  HYDROcodone-acetaminophen (NORCO/VICODIN) 5-325 MG tablet Take 1-2 tablets by mouth every 4 (four) hours as needed for moderate pain. 02/17/18  Yes Rhetta Mura, MD  montelukast (SINGULAIR) 10 MG tablet Take 10 mg by mouth daily.  06/26/13  Yes [provider]  ondansetron (ZOFRAN) 4 MG tablet Take 4 mg by mouth every 8 (eight) hours as needed for nausea or vomiting.   Yes [provider]  Probiotic Product (ALIGN) 4 MG CAPS Take 1 capsule by mouth daily.   Yes [provider]  traMADol (ULTRAM) 50 MG tablet Take 1 tablet (50 mg total) by mouth every 6 (six) hours as needed for severe pain. 02/17/18  Yes Meuth, Brooke A, PA-C  tretinoin (RETIN-A) 0.025 % cream Apply 1 application topically 2 (two) times a week.  05/30/16  Yes [provider]  VENTOLIN HFA 108 (90 BASE) MCG/ACT inhaler Inhale 2 puffs into the lungs every 4 (four) hours as needed for wheezing or shortness of breath. Before exercise 04/24/13  Yes [provider]     Family History  Problem Relation Age of Onset  . Diabetes Mellitus II Neg Hx   . Hypertension Neg Hx  Social History   Socioeconomic History  . Marital status: Married    Spouse name: Not on file  . Number of children: 2  . Years of education: Not on file  . Highest education level: Not on file  Occupational History  . Occupation: stay at home mom  Social Needs  . Financial resource strain: Not on file  . Food insecurity:    Worry: Not on file    Inability:  Not on file  . Transportation needs:    Medical: Not on file    Non-medical: Not on file  Tobacco Use  . Smoking status: Never Smoker  . Smokeless tobacco: Never Used  Substance and Sexual Activity  . Alcohol use: Yes    Comment: occasional  . Drug use: No  . Sexual activity: Not on file  Lifestyle  . Physical activity:    Days per week: Not on file    Minutes per session: Not on file  . Stress: Not on file  Relationships  . Social connections:    Talks on phone: Not on file    Gets together: Not on file    Attends religious service: Not on file    Active member of club or organization: Not on file    Attends meetings of clubs or organizations: Not on file    Relationship status: Not on file  Other Topics Concern  . 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 Sl jaundiced ;Chest clear to auscultation bilaterally.  Heart with regular rate and rhythm.  Abdomen soft, positive bowel sounds, mildly tender epigastric region to palpation; no lower extremity edema  Imaging: Dg Cholangiogram Operative  Result Date: 02/16/2018 CLINICAL DATA:  Intraoperative cholangiogram during laparoscopic cholecystectomy. EXAM: INTRAOPERATIVE CHOLANGIOGRAM FLUOROSCOPY TIME:  18 seconds COMPARISON:  MRCP-02/14/2018; CT abdomen pelvis-02/13/2018 FINDINGS: Intraoperative cholangiographic images of the right upper abdominal quadrant during laparoscopic cholecystectomy are provided for review. Surgical clips overlie the  expected location of the gallbladder fossa. Contrast injection demonstrates selective cannulation of the central aspect of the cystic duct. There is passage of contrast through the central aspect of the cystic duct with filling of a non dilated common bile duct. There is passage of contrast though the CBD and into the descending portion of the duodenum. There is minimal reflux of injected contrast into the common hepatic duct and central aspect of the non dilated intrahepatic biliary system. There are no discrete filling defects within the opacified portions of the biliary system to suggest the presence of choledocholithiasis. IMPRESSION: No evidence of choledocholithiasis. Electronically Signed   By: Simonne Come M.D.   On: 02/16/2018 09:50   Nm Hepatobiliary Liver Func  Result Date: 03/05/2018 CLINICAL DATA:  Laparoscopic cholecystectomy 02/16/2018. Abdominal pain. Elevated bilirubin. EXAM: NUCLEAR MEDICINE HEPATOBILIARY IMAGING TECHNIQUE: Sequential images of the abdomen were obtained out to 60 minutes following  intravenous administration of radiopharmaceutical. RADIOPHARMACEUTICALS:  7.6 mCi Tc-4428m  Choletec IV COMPARISON:  MRI 03/04/2018.  CT 03/03/2018 FINDINGS: Prompt uptake and biliary excretion of activity by the liver is seen. There is normal drainage of the common bile duct into the small bowel. No evidence of biliary obstruction or bowel leak. IMPRESSION: No evidence of biliary obstruction or bile leak. Electronically Signed   By: Charlett NoseKevin  Dover M.D.   On: 03/05/2018 10:40   Ct Abdomen Pelvis W Contrast  Result Date: 03/03/2018 CLINICAL DATA:  Mid abdominal pain with nausea and vomiting. History of laparoscopic cholecystectomy on 02/16/2018 for cholelithiasis and biliary pancreatitis. EXAM: CT ABDOMEN AND PELVIS WITH CONTRAST TECHNIQUE: Multidetector CT imaging of the abdomen and pelvis was performed using the standard protocol following bolus administration of intravenous contrast. CONTRAST:   100mL ISOVUE-300 IOPAMIDOL (ISOVUE-300) INJECTION 61% COMPARISON:  02/13/2018 FINDINGS: Lower chest: No acute abnormality. Hepatobiliary: No focal liver abnormality is seen. Status post cholecystectomy. No biliary dilatation. No abnormal fluid collections are identified post cholecystectomy. Cystic duct clips are normally positioned and show no obvious evidence of migration by CT. Pancreas: Pancreatic inflammation has resolved since the prior CT. No mass or pancreatic ductal dilatation. No evidence of pseudocyst. Spleen: Normal in size without focal abnormality. Adrenals/Urinary Tract: Adrenal glands are unremarkable. Kidneys are normal, without renal calculi, focal lesion, or hydronephrosis. Bladder is unremarkable. Stomach/Bowel: No evidence of bowel obstruction, ileus or inflammation. No free air. No bowel lesions identified. Vascular/Lymphatic: No significant vascular findings are present. No enlarged abdominal or pelvic lymph nodes. Reproductive: Uterus and bilateral adnexa are unremarkable. Other: No abdominal wall hernia or abnormality. No focal abscess or ascites. Musculoskeletal: Stable degenerative disc disease at L5-S1. IMPRESSION: No acute findings or complications evident after recent cholecystectomy. Previously noted pancreatic inflammation has resolved by imaging. Electronically Signed   By: Irish LackGlenn  Yamagata M.D.   On: 03/03/2018 16:29   Ct Abdomen Pelvis W Contrast  Result Date: 02/13/2018 CLINICAL DATA:  Nausea vomiting and abdominal pain EXAM: CT ABDOMEN AND PELVIS WITH CONTRAST TECHNIQUE: Multidetector CT imaging of the abdomen and pelvis was performed using the standard protocol following bolus administration of intravenous contrast. CONTRAST:  100mL ISOVUE-300 IOPAMIDOL (ISOVUE-300) INJECTION 61% COMPARISON:  CT 04/25/2007 FINDINGS: Lower chest: Lung bases demonstrate no acute consolidation or effusion. The heart size is normal Hepatobiliary: No focal liver abnormality is seen. Multiple  calcified stones in the gallbladder. No biliary dilatation. Pancreas: Head, uncinate process and proximal body appear indistinct and enlarged. Moderate peripancreatic edema. No organized fluid collections. Normal enhancement of the pancreas. Spleen: Normal in size without focal abnormality. Adrenals/Urinary Tract: Adrenal glands are unremarkable. Kidneys are normal, without renal calculi, focal lesion, or hydronephrosis. Bladder is unremarkable. Stomach/Bowel: Stomach is within normal limits. Wall thickening of the duodenum and proximal jejunal loops. No dilated small bowel. No colon wall thickening. Negative appendix. Sigmoid colon diverticular disease. Collapsed appearance of the sigmoid colon. Vascular/Lymphatic: Normal enhancement of the portal vessels and splenic vein. Nonaneurysmal aorta. No significant adenopathy Reproductive: Multiple hypodensities at the cervix may reflect nabothian cysts. Tampon in the vagina. Cyst in the right ovary measuring 2 cm. Other: Small free fluid in the pelvis.  No free air. Musculoskeletal: Degenerative changes at L5-S1. No acute or suspicious abnormality. IMPRESSION: 1. Enlarged indistinct appearance of the head, uncinate process and proximal body of the pancreas with surrounding fluid and moderate edema suspect for an acute pancreatitis. No organized fluid collection. No necrosis. 2. Thickened appearance of the duodenum and proximal jejunal bowel  loops, either due to reactive inflammation from the pancreas inflammation or duodenitis/enteritis 3. Gallstones 4. Sigmoid colon diverticular disease without acute inflammation 5. Small free fluid in the pelvis 6. Multiple low-density lesions in the cervix likely representing nabothian cysts, suggest nonemergent pelvic ultrasound correlation Electronically Signed   By: Jasmine Pang M.D.   On: 02/13/2018 20:26   Mr Abdomen Mrcp Wo Contrast  Result Date: 02/14/2018 CLINICAL DATA:  Epigastric pain, vomiting, suspected acute  pancreatitis on CT EXAM: MRI ABDOMEN WITHOUT CONTRAST TECHNIQUE: Multiplanar multisequence MR imaging was performed without the administration of intravenous contrast. COMPARISON:  CT abdomen/pelvis dated 02/13/2018 FINDINGS: Lower chest: Trace bilateral pleural effusions, left greater than right. Hepatobiliary: Liver is within normal limits. Layering small gallstones (series 3/image 40), without gallbladder wall thickening or pericholecystic fluid. No intrahepatic or extrahepatic ductal dilatation. Common duct measures 4 mm. No choledocholithiasis is seen. Pancreas: Peripancreatic fluid/inflammatory changes, suggesting acute pancreatitis. No pancreatic ductal dilatation. No discrete fluid collection/pseudocyst. Spleen:  Within normal limits. Adrenals/Urinary Tract:  Adrenal glands are within normal limits. Kidneys are within normal limits. Perinephric fluid along the left kidney is likely reactive. Stomach/Bowel: Stomach and visualized bowel are grossly unremarkable. Vascular/Lymphatic:  No evidence of abdominal aortic aneurysm. No suspicious abdominal lymphadenopathy. Other: Peripancreatic fluid/edema in the left upper abdomen along the pancreatic tail. Musculoskeletal: No focal osseous lesions. IMPRESSION: Acute pancreatitis.  No drainable fluid collection/pseudocyst. Cholelithiasis, without associated inflammatory changes. No choledocholithiasis is seen. Electronically Signed   By: Charline Bills M.D.   On: 02/14/2018 09:40   Mr 3d Recon At Scanner  Result Date: 03/04/2018 CLINICAL DATA:  Severe upper abdominal pain, status post cholecystectomy 2 weeks ago, elevated LFTs EXAM: MRI ABDOMEN WITHOUT AND WITH CONTRAST (INCLUDING MRCP) TECHNIQUE: Multiplanar multisequence MR imaging of the abdomen was performed both before and after the administration of intravenous contrast. Heavily T2-weighted images of the biliary and pancreatic ducts were obtained, and three-dimensional MRCP images were rendered by post  processing. CONTRAST:  6 mL Gadovist IV COMPARISON:  CT abdomen/pelvis dated 03/03/2018. MRI abdomen dated 02/14/2018. FINDINGS: Lower chest: Lung bases are clear. Hepatobiliary: Liver is within normal limits. No suspicious/enhancing hepatic lesions. No hepatic steatosis. Status post cholecystectomy. No fluid collection in the gallbladder fossa. No intrahepatic or extrahepatic ductal dilatation. No choledocholithiasis is seen. Pancreas: Within normal limits. No peripancreatic inflammatory changes. Spleen:  Within normal limits. Adrenals/Urinary Tract:  Adrenal glands are within normal limits. Kidneys are within normal limits.  No hydronephrosis. Stomach/Bowel: Stomach is within normal limits. Visualized bowel is unremarkable. Vascular/Lymphatic:  No evidence of abdominal aortic aneurysm. No suspicious abdominal lymphadenopathy. Other:  No abdominal ascites. Musculoskeletal: No focal osseous lesions. IMPRESSION: Status post cholecystectomy. No fluid collection in the gallbladder fossa. No intrahepatic or extrahepatic ductal dilatation. No choledocholithiasis is seen. No peripancreatic inflammatory changes on MRI. Electronically Signed   By: Charline Bills M.D.   On: 03/04/2018 10:07   Mr 3d Recon At Scanner  Result Date: 02/14/2018 CLINICAL DATA:  Epigastric pain, vomiting, suspected acute pancreatitis on CT EXAM: MRI ABDOMEN WITHOUT CONTRAST TECHNIQUE: Multiplanar multisequence MR imaging was performed without the administration of intravenous contrast. COMPARISON:  CT abdomen/pelvis dated 02/13/2018 FINDINGS: Lower chest: Trace bilateral pleural effusions, left greater than right. Hepatobiliary: Liver is within normal limits. Layering small gallstones (series 3/image 40), without gallbladder wall thickening or pericholecystic fluid. No intrahepatic or extrahepatic ductal dilatation. Common duct measures 4 mm. No choledocholithiasis is seen. Pancreas: Peripancreatic fluid/inflammatory changes, suggesting  acute pancreatitis. No pancreatic ductal dilatation. No  discrete fluid collection/pseudocyst. Spleen:  Within normal limits. Adrenals/Urinary Tract:  Adrenal glands are within normal limits. Kidneys are within normal limits. Perinephric fluid along the left kidney is likely reactive. Stomach/Bowel: Stomach and visualized bowel are grossly unremarkable. Vascular/Lymphatic:  No evidence of abdominal aortic aneurysm. No suspicious abdominal lymphadenopathy. Other: Peripancreatic fluid/edema in the left upper abdomen along the pancreatic tail. Musculoskeletal: No focal osseous lesions. IMPRESSION: Acute pancreatitis.  No drainable fluid collection/pseudocyst. Cholelithiasis, without associated inflammatory changes. No choledocholithiasis is seen. Electronically Signed   By: Charline Bills M.D.   On: 02/14/2018 09:40   Mr Abdomen Mrcp Vivien Rossetti Contast  Result Date: 03/04/2018 CLINICAL DATA:  Severe upper abdominal pain, status post cholecystectomy 2 weeks ago, elevated LFTs EXAM: MRI ABDOMEN WITHOUT AND WITH CONTRAST (INCLUDING MRCP) TECHNIQUE: Multiplanar multisequence MR imaging of the abdomen was performed both before and after the administration of intravenous contrast. Heavily T2-weighted images of the biliary and pancreatic ducts were obtained, and three-dimensional MRCP images were rendered by post processing. CONTRAST:  6 mL Gadovist IV COMPARISON:  CT abdomen/pelvis dated 03/03/2018. MRI abdomen dated 02/14/2018. FINDINGS: Lower chest: Lung bases are clear. Hepatobiliary: Liver is within normal limits. No suspicious/enhancing hepatic lesions. No hepatic steatosis. Status post cholecystectomy. No fluid collection in the gallbladder fossa. No intrahepatic or extrahepatic ductal dilatation. No choledocholithiasis is seen. Pancreas: Within normal limits. No peripancreatic inflammatory changes. Spleen:  Within normal limits. Adrenals/Urinary Tract:  Adrenal glands are within normal limits. Kidneys are within  normal limits.  No hydronephrosis. Stomach/Bowel: Stomach is within normal limits. Visualized bowel is unremarkable. Vascular/Lymphatic:  No evidence of abdominal aortic aneurysm. No suspicious abdominal lymphadenopathy. Other:  No abdominal ascites. Musculoskeletal: No focal osseous lesions. IMPRESSION: Status post cholecystectomy. No fluid collection in the gallbladder fossa. No intrahepatic or extrahepatic ductal dilatation. No choledocholithiasis is seen. No peripancreatic inflammatory changes on MRI. Electronically Signed   By: Charline Bills M.D.   On: 03/04/2018 10:07    Labs:  CBC: Recent Labs    02/17/18 0508 03/03/18 2130 03/04/18 0328 03/05/18 0341  WBC 20.7* 27.9* 17.6* 6.3  HGB 10.0* 12.6 10.7* 10.5*  HCT 30.3* 38.7 33.4* 32.8*  PLT 328 400 356 311    COAGS: Recent Labs    03/05/18 1100  INR 0.99    BMP: Recent Labs    03/03/18 2130 03/04/18 0328 03/04/18 1121 03/05/18 0341  NA 136 140 138 139  K 3.5 3.6 3.5 3.5  CL 101 108 106 108  CO2 24 25 22 23   GLUCOSE 143* 114* 109* 96  BUN 10 8 8  5*  CALCIUM 9.0 8.3* 8.1* 8.1*  CREATININE 0.58 0.70 0.63 0.62  GFRNONAA >60 >60 >60 >60  GFRAA >60 >60 >60 >60    LIVER FUNCTION TESTS: Recent Labs    03/03/18 2130 03/04/18 0328 03/04/18 1121 03/05/18 0341  BILITOT 4.1* 4.4* 6.4* 5.1*  AST 368* 350* 335* 202*  ALT 205* 219* 247* 212*  ALKPHOS 121 116 142* 150*  PROT 7.5 6.3* 6.5 5.9*  ALBUMIN 3.9 3.4* 3.4* 3.1*    TUMOR MARKERS: No results for input(s): AFPTM, CEA, CA199, CHROMGRNA in the last 8760 hours.  Assessment and Plan:  47 y.o. female with history of asthma, IBS, prior ischemic colitis and gallstone pancreatitis/chronic cholecystitis status post laparoscopic cholecystectomy on 02/16/2018.  She was recently admitted with multiple episodes of nausea and vomiting along with persistent epigastric discomfort and elevated liver function tests/leukocytosis.  Leukocytosis has since normalized but LFTs  remain elevated with  total bilirubin 5.1.  MRI abdomen performed yesterday revealed no apparent abnormality.  Request now received for image guided random core liver biopsy for further evaluation.  Imaging has been reviewed by Dr. Grace Isaac.Risks and benefits discussed with the patient/spouse including, but not limited to bleeding, infection, damage to adjacent structures or low yield requiring additional tests.  All of the patient's questions were answered, patient is agreeable to proceed. Consent signed and in chart.  Procedure scheduled for 11/15   Thank you for this interesting consult.  I greatly enjoyed meeting [name] and look forward to participating in their care.  A copy of this report was sent to the requesting provider on this date.  Electronically Signed: D. Jeananne Rama, PA-C 03/05/2018, 3:50 PM   I spent a total of 25 minutes  in face to face in clinical consultation, greater than 50% of which was counseling/coordinating care for image guided random core liver biopsy

## 2018-03-06 ENCOUNTER — Inpatient Hospital Stay (HOSPITAL_COMMUNITY): Payer: BLUE CROSS/BLUE SHIELD

## 2018-03-06 DIAGNOSIS — K7689 Other specified diseases of liver: Secondary | ICD-10-CM | POA: Diagnosis not present

## 2018-03-06 LAB — CBC WITH DIFFERENTIAL/PLATELET
Abs Immature Granulocytes: 0.06 10*3/uL (ref 0.00–0.07)
Basophils Absolute: 0.1 10*3/uL (ref 0.0–0.1)
Basophils Relative: 1 %
Eosinophils Absolute: 0.9 10*3/uL — ABNORMAL HIGH (ref 0.0–0.5)
Eosinophils Relative: 9 %
HCT: 34.4 % — ABNORMAL LOW (ref 36.0–46.0)
Hemoglobin: 11.1 g/dL — ABNORMAL LOW (ref 12.0–15.0)
Immature Granulocytes: 1 %
Lymphocytes Relative: 14 %
Lymphs Abs: 1.5 10*3/uL (ref 0.7–4.0)
MCH: 28.7 pg (ref 26.0–34.0)
MCHC: 32.3 g/dL (ref 30.0–36.0)
MCV: 88.9 fL (ref 80.0–100.0)
Monocytes Absolute: 0.7 10*3/uL (ref 0.1–1.0)
Monocytes Relative: 7 %
Neutro Abs: 7.1 10*3/uL (ref 1.7–7.7)
Neutrophils Relative %: 68 %
Platelets: 324 10*3/uL (ref 150–400)
RBC: 3.87 MIL/uL (ref 3.87–5.11)
RDW: 14.1 % (ref 11.5–15.5)
WBC: 10.3 10*3/uL (ref 4.0–10.5)
nRBC: 0 % (ref 0.0–0.2)

## 2018-03-06 LAB — COMPREHENSIVE METABOLIC PANEL
ALT: 173 U/L — ABNORMAL HIGH (ref 0–44)
AST: 86 U/L — ABNORMAL HIGH (ref 15–41)
Albumin: 3.2 g/dL — ABNORMAL LOW (ref 3.5–5.0)
Alkaline Phosphatase: 173 U/L — ABNORMAL HIGH (ref 38–126)
Anion gap: 10 (ref 5–15)
BUN: 8 mg/dL (ref 6–20)
CO2: 22 mmol/L (ref 22–32)
Calcium: 8.4 mg/dL — ABNORMAL LOW (ref 8.9–10.3)
Chloride: 108 mmol/L (ref 98–111)
Creatinine, Ser: 0.81 mg/dL (ref 0.44–1.00)
GFR calc Af Amer: >60 mL/min (ref >60)
GFR calc non Af Amer: >60 mL/min (ref >60)
Glucose, Bld: 102 mg/dL — ABNORMAL HIGH (ref 70–99)
Potassium: 3.5 mmol/L (ref 3.5–5.1)
Sodium: 140 mmol/L (ref 135–145)
Total Bilirubin: 1.9 mg/dL — ABNORMAL HIGH (ref 0.3–1.2)
Total Protein: 6.5 g/dL (ref 6.5–8.1)

## 2018-03-06 LAB — ANCA TITERS
Atypical P-ANCA titer: <1:20 {titer}
C-ANCA: <1:20 {titer}
P-ANCA: <1:20 {titer}

## 2018-03-06 MED ORDER — MIDAZOLAM HCL 2 MG/2ML IJ SOLN
INTRAMUSCULAR | Status: AC | PRN
  Administered 2018-03-06 (×2): 1 mg via INTRAVENOUS

## 2018-03-06 MED ORDER — FENTANYL CITRATE (PF) 100 MCG/2ML IJ SOLN
INTRAMUSCULAR | Status: AC | PRN
  Administered 2018-03-06: 50 ug via INTRAVENOUS

## 2018-03-06 MED ORDER — MIDAZOLAM HCL 2 MG/2ML IJ SOLN
INTRAMUSCULAR | Status: AC
  Filled 2018-03-06: qty 4

## 2018-03-06 MED ORDER — GELATIN ABSORBABLE 12-7 MM EX MISC
CUTANEOUS | Status: AC
  Filled 2018-03-06: qty 1

## 2018-03-06 MED ORDER — LIDOCAINE HCL 1 % IJ SOLN
INTRAMUSCULAR | Status: AC
  Filled 2018-03-06: qty 20

## 2018-03-06 MED ORDER — FENTANYL CITRATE (PF) 100 MCG/2ML IJ SOLN
INTRAMUSCULAR | Status: AC
  Filled 2018-03-06: qty 2

## 2018-03-06 NOTE — Procedures (Signed)
Interventional Radiology Procedure Note  Procedure: US guided random core liver biopsy  Complications: None  Estimated Blood Loss: None  Recommendations: - Bedrest x 2 hrs - Path pending    Signed,  Sterling BigHeath K. McCullough, MD

## 2018-03-06 NOTE — Progress Notes (Addendum)
Progress Note   Subjective  Chief Complaint: Elevated LFT's, Epigastric pain  This morning, patient continues to feel better. She is prepared for her liver bx today.   Objective   Vital signs in last 24 hours: Temp:  [98.7 F (37.1 C)-99 F (37.2 C)] 98.7 F (37.1 C) (11/15 0425) Pulse Rate:  [62-65] 62 (11/15 0425) Resp:  [16-18] 16 (11/15 0425) BP: (91-126)/(64-71) 91/64 (11/15 0425) SpO2:  [97 %-98 %] 97 % (11/15 0425) Last BM Date: 03/03/18 General:    white female in NAD Heart:  Regular rate and rhythm; no murmurs Lungs: Respirations even and unlabored, lungs CTA bilaterally Abdomen:  Soft, nontender and nondistended. Normal bowel sounds. Extremities:  Without edema. Neurologic:  Alert and oriented,  grossly normal neurologically. Psych:  Cooperative. Normal mood and affect.  Intake/Output from previous day: 11/14 0701 - 11/15 0700 In: 752.3 [P.O.:544; I.V.:10; IV Piggyback:198.3] Out: 1800 [Urine:1800] Intake/Output this shift: Total I/O In: -  Out: 450 [Urine:450]  Lab Results: Recent Labs    03/04/18 0328 03/05/18 0341 03/06/18 0423  WBC 17.6* 6.3 10.3  HGB 10.7* 10.5* 11.1*  HCT 33.4* 32.8* 34.4*  PLT 356 311 324   BMET Recent Labs    03/04/18 1121 03/05/18 0341 03/06/18 0423  NA 138 139 140  K 3.5 3.5 3.5  CL 106 108 108  CO2 _0 GLUCOSE 109* 96 102*  BUN 8 5* 8  CREATININE 0.63 0.62 0.81  CALCIUM 8.1* 8.1* 8.4*   LFT Recent Labs    03/04/18 0328  03/06/18 0423  PROT 6.3*   < > 6.5  ALBUMIN 3.4*   < > 3.2*  AST 350*   < > 86*  ALT 219*   < > 173*  ALKPHOS 116   < > 173*  BILITOT 4.4*   < > 1.9*  BILIDIR 2.5*  --   --   IBILI 1.9*  --   --    < > = values in this interval not displayed.   PT/INR Recent Labs    03/05/18 1100  LABPROT 13.0  INR 0.99    Studies/Results: Nm Hepatobiliary Liver Func  Result Date: 03/05/2018 CLINICAL DATA:  Laparoscopic cholecystectomy 02/16/2018. Abdominal pain. Elevated  bilirubin. EXAM: NUCLEAR MEDICINE HEPATOBILIARY IMAGING TECHNIQUE: Sequential images of the abdomen were obtained out to 60 minutes following intravenous administration of radiopharmaceutical. RADIOPHARMACEUTICALS:  7.6 mCi Tc-65m Choletec IV COMPARISON:  MRI 03/04/2018.  CT 03/03/2018 FINDINGS: Prompt uptake and biliary excretion of activity by the liver is seen. There is normal drainage of the common bile duct into the small bowel. No evidence of biliary obstruction or bowel leak. IMPRESSION: No evidence of biliary obstruction or bile leak. Electronically Signed   By: KRolm BaptiseM.D.   On: 03/05/2018 10:40       Assessment / Plan:   Assessment: 1.  Epigastric pain with elevated LFTs: Status post cholecystectomy 10/28, LFTs all trending down, epigastric pain improving, MRCP, CT and HIDA scan all negative, ESR minimally elevated at 23, PT/INR normal, CRP minimally elevated 2.4, iron studies normal, B12 normal, alpha-1 antitrypsin normal, ceruloplasmin, AMA, ASMA and hepatitis panel normal 2.  Leukocytosis: Improved  Plan: 1.  We will await results from liver biopsy today. 2.  LFTs are trending down.  At this time patient will likely be discharged tomorrow with follow-up in our clinic to review results. 3.  Please await further recommendations from Dr. GLyndel Safelater today 4.  Patient may return  to regular diet after time of liver biopsy  Thanks for your kind consultation.    LOS: 1 day   Levin Erp  03/06/2018, 11:10 AM   Attending physician's note   I have taken an interval history, reviewed the chart and examined the patient. I agree with the Advanced Practitioner's note, impression and recommendations.   Much better today. LFTs, WBC count trending down very well. For liver Bx today.  Discussed in detail with the patient patient's husband. Hepatitis work-up negative so far. Elevated sed rate/CRP c/w infectious/allergic etiology.  Plan: Liver Bx today, continue A/Bs, trend  LFTs, CBC.  If better, will likely D/C home in AM with GI FU.  Hold off on steroids.  Hold off on EUS/ERCP for now.  Carmell Austria, MD

## 2018-03-06 NOTE — Progress Notes (Signed)
PROGRESS NOTE  [name] OFB:[id number] DOB: 21-Mar-1971 DOA: 03/03/2018 PCP: Jonathon Jordan, MD  HPI/Recap of past 24 hours: HPI from Dr Phylis Bougie is a 47 y.o. female with history of gallstone pancreatitis recently admitted and cholecystectomy done about 3 weeks ago, started developing abdominal pain mostly in the epigastric area with multiple episodes of nausea, vomiting for 1 day. Had a general surgeon's follow up appt, was given pain relief medication and instructed to go to the ER if pain worsens. Since the pain worsened patient went to [place].  Denies any fever chills diarrhea or any blood in the vomitus.  Denies chest pain or shortness of breath. In the ER patient had blood work done which shows leukocytosis with AST of 365 ALT of 205 total bilirubin 4.1.  CT abdomen pelvis was unremarkable.  Patient admitted for further management. GI consulted   Today, pt denies any new complaints.  Assessment/Plan: Principal Problem:   Abdominal pain Active Problems:   LFT elevation   Epigastric abdominal pain   Epigastric pain  Epigastric pain with elevated LFTs with recent cholecystectomy AST 335, ALT 247, T.bili 6.4, ALK 142, lipase WNL LFTs trending down except ALK Hepatitis w/u including viral hep panel, ceruloplasmin, alpha-1 antitrypsin, P-ANCA, C-ANCA all unremarkable, further work-up pending CT abdomen/pelvis: No acute findings or complications evident after recent cholecystectomy. Previously noted pancreatic inflammation has resolved by imaging MRCP showed: Status post cholecystectomy. No fluid collection in the gallbladder fossa. No intrahepatic or extrahepatic ductal dilatation. No choledocholithiasis is seen. No peripancreatic inflammatory changes on MRI HIDA scan showed no evidence of biliary obstruction or bile leak. Continue pain control GI on board: Rec liver biopsy, continue IV Zosyn S/P Liver biopsy by IR on 03/06/18 Daily CMP Monitor  closely  Leukocytosis Resolved, since after IV Zosyn ?chronic, since 01/2018, even in 2009 Unclear etiology for now Daily CBC to monitor trend  Normocytic anemia of unclear etiology Baseline around 12 Anemia panel: Iron 42, TIBC 338, sats 12%, ferritin 113, folate 15.3, vitamin B12 598 Daily cbc  Previous hx of ischemic colitis CT abdomen pelvis unremarkable    Code Status: Full  Family Communication: Husband at bedside  Disposition Plan: Likely d/c home on 03/07/18 if continues to improve as per GI   Consultants:  GI  IR  Procedures:  Liver biopsy on 03/06/18 by IR  Antimicrobials:  IV Zosyn  DVT prophylaxis: SCD, pt s/p liver biopsy   Objective: Vitals:   03/06/18 1130 03/06/18 1135 03/06/18 1213 03/06/18 1341  BP: 105/71 114/74 109/75 109/69  Pulse: (!) 56 (!) 59 (!) 56 74  Resp: '10 14 16 16  ' Temp:   98.8 F (37.1 C) 98.1 F (36.7 C)  TempSrc:   Oral Oral  SpO2: 99% 100% 100% 100%  Weight:      Height:        Intake/Output Summary (Last 24 hours) at 03/06/2018 1843 Last data filed at 03/06/2018 1800 Gross per 24 hour  Intake 324.75 ml  Output 850 ml  Net -525.25 ml   Filed Weights   03/03/18 2113  Weight: 59.8 kg    Exam:  General: NAD   Cardiovascular: S1, S2 present  Respiratory: CTAB  Abdomen: Soft, nontender, nondistended, bowel sounds present  Musculoskeletal: No bilateral pedal edema noted  Skin: Normal  Psychiatry: Normal mood   Data Reviewed: CBC: Recent Labs  Lab 03/03/18 2130 03/04/18 0328 03/05/18 0341 03/06/18 0423  WBC 27.9* 17.6* 6.3 10.3  NEUTROABS  --   --  4.3 7.1  HGB 12.6 10.7* 10.5* 11.1*  HCT 38.7 33.4* 32.8* 34.4*  MCV 87.0 87.4 87.0 88.9  PLT 400 356 311 846   Basic Metabolic Panel: Recent Labs  Lab 03/03/18 2130 03/04/18 0328 03/04/18 1121 03/05/18 0341 03/06/18 0423  NA 136 140 138 139 140  K 3.5 3.6 3.5 3.5 3.5  CL 101 108 106 108 108  CO2 '24 25 22 23 22  ' GLUCOSE 143* 114*  109* 96 102*  BUN '10 8 8 ' 5* 8  CREATININE 0.58 0.70 0.63 0.62 0.81  CALCIUM 9.0 8.3* 8.1* 8.1* 8.4*   GFR: Estimated Creatinine Clearance: 78.9 mL/min (by C-G formula based on SCr of 0.81 mg/dL). Liver Function Tests: Recent Labs  Lab 03/03/18 2130 03/04/18 0328 03/04/18 1121 03/05/18 0341 03/06/18 0423  AST 368* 350* 335* 202* 86*  ALT 205* 219* 247* 212* 173*  ALKPHOS 121 116 142* 150* 173*  BILITOT 4.1* 4.4* 6.4* 5.1* 1.9*  PROT 7.5 6.3* 6.5 5.9* 6.5  ALBUMIN 3.9 3.4* 3.4* 3.1* 3.2*   Recent Labs  Lab 03/03/18 2130 03/04/18 0328  LIPASE 24 23   No results for input(s): AMMONIA in the last 168 hours. Coagulation Profile: Recent Labs  Lab 03/05/18 1100  INR 0.99   Cardiac Enzymes: No results for input(s): CKTOTAL, CKMB, CKMBINDEX, TROPONINI in the last 168 hours. BNP (last 3 results) No results for input(s): PROBNP in the last 8760 hours. HbA1C: No results for input(s): HGBA1C in the last 72 hours. CBG: No results for input(s): GLUCAP in the last 168 hours. Lipid Profile: No results for input(s): CHOL, HDL, LDLCALC, TRIG, CHOLHDL, LDLDIRECT in the last 72 hours. Thyroid Function Tests: No results for input(s): TSH, T4TOTAL, FREET4, T3FREE, THYROIDAB in the last 72 hours. Anemia Panel: Recent Labs    03/04/18 1724 03/05/18 0341  VITAMINB12  --  598  FOLATE  --  15.3  FERRITIN  --  113  TIBC 379 338  IRON 31 42   Urine analysis:    Component Value Date/Time   COLORURINE YELLOW 03/03/2018 2338   APPEARANCEUR CLEAR 03/03/2018 2338   LABSPEC 1.015 03/03/2018 2338   PHURINE 6.5 03/03/2018 2338   GLUCOSEU NEGATIVE 03/03/2018 2338   HGBUR TRACE (A) 03/03/2018 2338   BILIRUBINUR MODERATE (A) 03/03/2018 2338   KETONESUR NEGATIVE 03/03/2018 2338   PROTEINUR NEGATIVE 03/03/2018 2338   UROBILINOGEN 0.2 04/25/2007 1418   NITRITE NEGATIVE 03/03/2018 2338   LEUKOCYTESUR NEGATIVE 03/03/2018 2338   Sepsis Labs: '@LABRCNTIP' (procalcitonin:4,lacticidven:4)  )No  results found for this or any previous visit (from the past 240 hour(s)).    Studies: US Biopsy (liver)  Result Date: 03/06/2018 INDICATION: 47 year old female with a history of abnormal LFTs and elevated bilirubin following laparoscopic cholecystectomy. But no complicating feature could be identified by MRI, MRCP or nuclear medicine HIDA scan. She presents for random liver biopsy for further tissue evaluation. EXAM: Ultrasound-guided core biopsy of the liver Interventional Radiologist:  Criselda Peaches, MD MEDICATIONS: None. ANESTHESIA/SEDATION: Fentanyl 50 mcg IV; Versed 2 mg IV Moderate Sedation Time:  10 minutes The patient was continuously monitored during the procedure by the interventional radiology nurse under my direct supervision. FLUOROSCOPY TIME:  None COMPLICATIONS: None immediate. PROCEDURE: Informed consent was obtained from the patient following explanation of the procedure, risks, benefits and alternatives. The patient understands, agrees and consents for the procedure. All questions were addressed. A time out was performed. The right upper quadrant was interrogated with ultrasound. A relatively avascular plane of the liver  was identified. A suitable skin entry site was selected and marked. The region was then sterilely prepped and draped in standard fashion using chlorhexidine skin prep. Local anesthesia was attained by infiltration with 1% lidocaine. A small dermatotomy was made. Under real-time sonographic guidance, a 17 gauge trocar needle was advanced into the liver. Multiple 18 gauge core biopsies were then coaxially obtained. Needle placement was confirmed on all biopsy passes with real-time sonography. Biopsy specimens were placed in formalin and delivered to pathology for further analysis. As the introducer needle was removed, the biopsy tract was embolized with a Gel-Foam slurry. Post biopsy ultrasound imaging demonstrates no active bleeding or perihepatic hematoma. The patient  tolerated the procedure well. IMPRESSION: Technically successful ultrasound-guided random core biopsy of the liver. Electronically Signed   By: Jacqulynn Cadet M.D.   On: 03/06/2018 14:38    Scheduled Meds: . fentaNYL      . gelatin adsorbable      . lidocaine      . midazolam        Continuous Infusions: . sodium chloride    . piperacillin-tazobactam (ZOSYN)  IV 3.375 g (03/06/18 1437)     LOS: 1 day     Alma Friendly, MD [facility]   If 7PM-7AM, please contact night-coverage [email or web address] 03/06/2018, 6:43 PM

## 2018-03-07 DIAGNOSIS — D72829 Elevated white blood cell count, unspecified: Secondary | ICD-10-CM

## 2018-03-07 LAB — CBC WITH DIFFERENTIAL/PLATELET
Abs Immature Granulocytes: 0.1 10*3/uL — ABNORMAL HIGH (ref 0.00–0.07)
Basophils Absolute: 0.1 10*3/uL (ref 0.0–0.1)
Basophils Relative: 1 %
Eosinophils Absolute: 0.7 10*3/uL — ABNORMAL HIGH (ref 0.0–0.5)
Eosinophils Relative: 6 %
HCT: 35.6 % — ABNORMAL LOW (ref 36.0–46.0)
Hemoglobin: 11.6 g/dL — ABNORMAL LOW (ref 12.0–15.0)
Immature Granulocytes: 1 %
Lymphocytes Relative: 17 %
Lymphs Abs: 1.8 10*3/uL (ref 0.7–4.0)
MCH: 28.4 pg (ref 26.0–34.0)
MCHC: 32.6 g/dL (ref 30.0–36.0)
MCV: 87 fL (ref 80.0–100.0)
Monocytes Absolute: 0.4 10*3/uL (ref 0.1–1.0)
Monocytes Relative: 4 %
Neutro Abs: 7.6 10*3/uL (ref 1.7–7.7)
Neutrophils Relative %: 71 %
Platelets: 344 10*3/uL (ref 150–400)
RBC: 4.09 MIL/uL (ref 3.87–5.11)
RDW: 13.9 % (ref 11.5–15.5)
WBC: 10.6 10*3/uL — ABNORMAL HIGH (ref 4.0–10.5)
nRBC: 0 % (ref 0.0–0.2)

## 2018-03-07 LAB — COMPREHENSIVE METABOLIC PANEL
ALT: 154 U/L — ABNORMAL HIGH (ref 0–44)
AST: 84 U/L — ABNORMAL HIGH (ref 15–41)
Albumin: 3.5 g/dL (ref 3.5–5.0)
Alkaline Phosphatase: 167 U/L — ABNORMAL HIGH (ref 38–126)
Anion gap: 10 (ref 5–15)
BUN: 8 mg/dL (ref 6–20)
CO2: 23 mmol/L (ref 22–32)
Calcium: 8.6 mg/dL — ABNORMAL LOW (ref 8.9–10.3)
Chloride: 105 mmol/L (ref 98–111)
Creatinine, Ser: 0.84 mg/dL (ref 0.44–1.00)
GFR calc Af Amer: >60 mL/min (ref >60)
GFR calc non Af Amer: >60 mL/min (ref >60)
Glucose, Bld: 177 mg/dL — ABNORMAL HIGH (ref 70–99)
Potassium: 3.5 mmol/L (ref 3.5–5.1)
Sodium: 138 mmol/L (ref 135–145)
Total Bilirubin: 2.5 mg/dL — ABNORMAL HIGH (ref 0.3–1.2)
Total Protein: 6.9 g/dL (ref 6.5–8.1)

## 2018-03-07 MED ORDER — LEVOFLOXACIN 500 MG PO TABS: 500.0000 mg | ORAL_TABLET | Freq: Every day | ORAL | 0 refills | Status: AC

## 2018-03-07 NOTE — Discharge Summary (Signed)
Discharge Summary  [name] HGD:[id number] DOB: [birth year]  PCP: Jonathon Jordan, MD  Admit date: 03/03/2018 Discharge date: 03/07/2018  Time spent: 35 mins  Recommendations for Outpatient Follow-up:  1. PCP in 1 week with follow up labs 2. GI follow up   Discharge Diagnoses:  Active Hospital Problems   Diagnosis Date Noted  . Abdominal pain 03/03/2018  . Epigastric pain 03/05/2018  . LFT elevation 03/04/2018  . Epigastric abdominal pain 03/04/2018    Resolved Hospital Problems  No resolved problems to display.    Discharge Condition: Stable  Diet recommendation: Regular diet  Vitals:   03/06/18 2213 03/07/18 0557  BP: 117/89 109/64  Pulse: 66 61  Resp: 16 16  Temp: 98.7 F (37.1 C) 99.1 F (37.3 C)  SpO2: 99% 98%    History of present illness:  Darlene Dunkirkis a 47 y.o.femalewithhistory of gallstone pancreatitis recently admitted and cholecystectomy done about 3 weeks ago, started developing abdominal pain mostly in the epigastric area with multiple episodes of nausea, vomiting for 1 day. Had a general surgeon's follow up appt, was given pain relief medication and instructed to go to the ER if pain worsens. Since the pain worsened patient went to [facility]. Denies any fever chills diarrhea or any blood in the vomitus. Denies chest pain or shortness of breath. In the ER patient had blood work done which shows leukocytosis with AST of 365 ALT of 205 total bilirubin 4.1. CT abdomen pelvis was unremarkable. Patient admitted for further management. GI consulted   Today, pt reported mild pain around liver biopsy site, not requiring any pain meds. Denies any other new complaints. Stable to be d/c from GI and medicine stand point, with close follow up with GI and PCP.  Hospital Course:  Principal Problem:   Abdominal pain Active Problems:   LFT elevation   Epigastric abdominal pain   Epigastric pain  Epigastric pain with elevated LFTs with  recent cholecystectomy Unclear etiology AST 335, ALT 247, T.bili 6.4, ALK 142, lipase WNL LFTs trending down slowly Hepatitis w/u including viral hep panel, ceruloplasmin, alpha-1 antitrypsin, P-ANCA, C-ANCA all unremarkable, further work-up pending, follow up with GI CT abdomen/pelvis: No acute findings or complications evident after recent cholecystectomy. Previously noted pancreatic inflammation has resolved by imaging MRCP showed: Status post cholecystectomy. No fluid collection in the gallbladder fossa. No intrahepatic or extrahepatic ductal dilatation. No choledocholithiasis is seen. No peripancreatic inflammatory changes on MRI HIDA scan showed no evidence of biliary obstruction or bile leak. GI on board: Rec follow up, s/p IV Zosyn, rec d/c pt with 7 days of IV levaquin S/P Liver biopsy by IR on 03/06/18 Follow up with PCP and GI with repeat labs as well  Leukocytosis Trended down since after IV antibiotics ?chronic, since 01/2018, even in 2009 Unclear etiology for now PCP to follow up with repeat labs  Normocytic anemia of unclear etiology Baseline around 12 Anemia panel: Iron 42, TIBC 338, sats 12%, ferritin 113, folate 15.3, vitamin B12 598 PCP to follow up  Previous hx of ischemic colitis CT abdomen pelvis unremarkable   Procedures:  Liver biopsy on 03/06/18 by IR  Consultations:  GI  IR  Discharge Exam: BP 109/64 (BP Location: Left Arm)   Pulse 61   Temp 99.1 F (37.3 C) (Oral)   Resp 16   Ht _0  (1.651 m)   Wt 59.8 kg   LMP 02/08/2018 (Exact Date)   SpO2 98%   BMI 21.93 kg/m   General:  NAD Cardiovascular: S1, S2 present Respiratory: CTAB  Discharge Instructions You were cared for by a hospitalist during your hospital stay. If you have any questions about your discharge medications or the care you received while you were in the hospital after you are discharged, you can call the unit and asked to speak with the hospitalist on call if the  hospitalist that took care of you is not available. Once you are discharged, your primary care physician will handle any further medical issues. Please note that NO REFILLS for any discharge medications will be authorized once you are discharged, as it is imperative that you return to your primary care physician (or establish a relationship with a primary care physician if you do not have one) for your aftercare needs so that they can reassess your need for medications and monitor your lab values.   Allergies as of 03/07/2018   No Known Allergies     Medication List    STOP taking these medications   amoxicillin-clavulanate 875-125 MG tablet Commonly known as:  AUGMENTIN     TAKE these medications   ALIGN 4 MG Caps Take 1 capsule by mouth daily.   calcium-vitamin D 500-200 MG-UNIT tablet Commonly known as:  OSCAL WITH D Take 2 tablets by mouth daily with breakfast.   cetirizine 10 MG tablet Commonly known as:  ZYRTEC Take 10 mg by mouth daily.   cyclobenzaprine 10 MG tablet Commonly known as:  FLEXERIL Take 10 mg by mouth 3 (three) times daily as needed for muscle spasms.   diclofenac 75 MG EC tablet Commonly known as:  VOLTAREN Take 75 mg by mouth 2 (two) times daily as needed for mild pain.   fexofenadine 180 MG tablet Commonly known as:  ALLEGRA Take 180 mg by mouth daily.   GLUCOSAMINE-CHONDROITIN PO Take 1 tablet by mouth daily.   HYDROcodone-acetaminophen 5-325 MG tablet Commonly known as:  NORCO/VICODIN Take 1-2 tablets by mouth every 4 (four) hours as needed for moderate pain.   levofloxacin 500 MG tablet Commonly known as:  LEVAQUIN Take 1 tablet (500 mg total) by mouth daily for 7 days.   montelukast 10 MG tablet Commonly known as:  SINGULAIR Take 10 mg by mouth daily.   ondansetron 4 MG tablet Commonly known as:  ZOFRAN Take 4 mg by mouth every 8 (eight) hours as needed for nausea or vomiting.   traMADol 50 MG tablet Commonly known as:   ULTRAM Take 1 tablet (50 mg total) by mouth every 6 (six) hours as needed for severe pain.   tretinoin 0.025 % cream Commonly known as:  RETIN-A Apply 1 application topically 2 (two) times a week.   VENTOLIN HFA 108 (90 Base) MCG/ACT inhaler Generic drug:  albuterol Inhale 2 puffs into the lungs every 4 (four) hours as needed for wheezing or shortness of breath. Before exercise      No Known Allergies Follow-up Information    Jonathon Jordan, MD. Schedule an appointment as soon as possible for a visit in 1 week(s).   Specialty:  Family Medicine Contact information: [place] [address] [phone]        Jackquline Denmark, MD Follow up.   Specialties:  Gastroenterology, Internal Medicine Why:  Office will call you to schedule appointment Contact information: [place] [address] [phone]            The results of significant diagnostics from this hospitalization (including imaging, microbiology,  ancillary and laboratory) are listed below for reference.    Significant Diagnostic Studies: Dg Cholangiogram Operative  Result Date: 02/16/2018 CLINICAL DATA:  Intraoperative cholangiogram during laparoscopic cholecystectomy. EXAM: INTRAOPERATIVE CHOLANGIOGRAM FLUOROSCOPY TIME:  18 seconds COMPARISON:  MRCP-02/14/2018; CT abdomen pelvis-02/13/2018 FINDINGS: Intraoperative cholangiographic images of the right upper abdominal quadrant during laparoscopic cholecystectomy are provided for review. Surgical clips overlie the expected location of the gallbladder fossa. Contrast injection demonstrates selective cannulation of the central aspect of the cystic duct. There is passage of contrast through the central aspect of the cystic duct with filling of a non dilated common bile duct. There is passage of contrast though the CBD and into the descending portion of the duodenum. There is minimal reflux of injected  contrast into the common hepatic duct and central aspect of the non dilated intrahepatic biliary system. There are no discrete filling defects within the opacified portions of the biliary system to suggest the presence of choledocholithiasis. IMPRESSION: No evidence of choledocholithiasis. Electronically Signed   By: Sandi Mariscal M.D.   On: 02/16/2018 09:50   Nm Hepatobiliary Liver Func  Result Date: 03/05/2018 CLINICAL DATA:  Laparoscopic cholecystectomy 02/16/2018. Abdominal pain. Elevated bilirubin. EXAM: NUCLEAR MEDICINE HEPATOBILIARY IMAGING TECHNIQUE: Sequential images of the abdomen were obtained out to 60 minutes following intravenous administration of radiopharmaceutical. RADIOPHARMACEUTICALS:  7.6 mCi Tc-3m Choletec IV COMPARISON:  MRI 03/04/2018.  CT 03/03/2018 FINDINGS: Prompt uptake and biliary excretion of activity by the liver is seen. There is normal drainage of the common bile duct into the small bowel. No evidence of biliary obstruction or bowel leak. IMPRESSION: No evidence of biliary obstruction or bile leak. Electronically Signed   By: KRolm BaptiseM.D.   On: 03/05/2018 10:40   Ct Abdomen Pelvis W Contrast  Result Date: 03/03/2018 CLINICAL DATA:  Mid abdominal pain with nausea and vomiting. History of laparoscopic cholecystectomy on 02/16/2018 for cholelithiasis and biliary pancreatitis. EXAM: CT ABDOMEN AND PELVIS WITH CONTRAST TECHNIQUE: Multidetector CT imaging of the abdomen and pelvis was performed using the standard protocol following bolus administration of intravenous contrast. CONTRAST:  1036mISOVUE-300 IOPAMIDOL (ISOVUE-300) INJECTION 61% COMPARISON:  02/13/2018 FINDINGS: Lower chest: No acute abnormality. Hepatobiliary: No focal liver abnormality is seen. Status post cholecystectomy. No biliary dilatation. No abnormal fluid collections are identified post cholecystectomy. Cystic duct clips are normally positioned and show no obvious evidence of migration by CT. Pancreas:  Pancreatic inflammation has resolved since the prior CT. No mass or pancreatic ductal dilatation. No evidence of pseudocyst. Spleen: Normal in size without focal abnormality. Adrenals/Urinary Tract: Adrenal glands are unremarkable. Kidneys are normal, without renal calculi, focal lesion, or hydronephrosis. Bladder is unremarkable. Stomach/Bowel: No evidence of bowel obstruction, ileus or inflammation. No free air. No bowel lesions identified. Vascular/Lymphatic: No significant vascular findings are present. No enlarged abdominal or pelvic lymph nodes. Reproductive: Uterus and bilateral adnexa are unremarkable. Other: No abdominal wall hernia or abnormality. No focal abscess or ascites. Musculoskeletal: Stable degenerative disc disease at L5-S1. IMPRESSION: No acute findings or complications evident after recent cholecystectomy. Previously noted pancreatic inflammation has resolved by imaging. Electronically Signed   By: GlAletta Edouard.D.   On: 03/03/2018 16:29   Ct Abdomen Pelvis W Contrast  Result Date: 02/13/2018 CLINICAL DATA:  Nausea vomiting and abdominal pain EXAM: CT ABDOMEN AND PELVIS WITH CONTRAST TECHNIQUE: Multidetector CT imaging of the abdomen and pelvis was performed using the standard protocol following bolus administration of intravenous contrast. CONTRAST:  10080mSOVUE-300 IOPAMIDOL (ISOVUE-300) INJECTION 61% COMPARISON:  CT 04/25/2007 FINDINGS: Lower chest: Lung bases demonstrate no acute consolidation or effusion. The heart size is normal Hepatobiliary: No focal liver abnormality is seen. Multiple calcified stones in the gallbladder. No biliary dilatation. Pancreas: Head, uncinate process and proximal body appear indistinct and enlarged. Moderate peripancreatic edema. No organized fluid collections. Normal enhancement of the pancreas. Spleen: Normal in size without focal abnormality. Adrenals/Urinary Tract: Adrenal glands are unremarkable. Kidneys are normal, without renal calculi, focal  lesion, or hydronephrosis. Bladder is unremarkable. Stomach/Bowel: Stomach is within normal limits. Wall thickening of the duodenum and proximal jejunal loops. No dilated small bowel. No colon wall thickening. Negative appendix. Sigmoid colon diverticular disease. Collapsed appearance of the sigmoid colon. Vascular/Lymphatic: Normal enhancement of the portal vessels and splenic vein. Nonaneurysmal aorta. No significant adenopathy Reproductive: Multiple hypodensities at the cervix may reflect nabothian cysts. Tampon in the vagina. Cyst in the right ovary measuring 2 cm. Other: Small free fluid in the pelvis.  No free air. Musculoskeletal: Degenerative changes at L5-S1. No acute or suspicious abnormality. IMPRESSION: 1. Enlarged indistinct appearance of the head, uncinate process and proximal body of the pancreas with surrounding fluid and moderate edema suspect for an acute pancreatitis. No organized fluid collection. No necrosis. 2. Thickened appearance of the duodenum and proximal jejunal bowel loops, either due to reactive inflammation from the pancreas inflammation or duodenitis/enteritis 3. Gallstones 4. Sigmoid colon diverticular disease without acute inflammation 5. Small free fluid in the pelvis 6. Multiple low-density lesions in the cervix likely representing nabothian cysts, suggest nonemergent pelvic ultrasound correlation Electronically Signed   By: Donavan Foil M.D.   On: 02/13/2018 20:26   Mr Abdomen Mrcp Wo Contrast  Result Date: 02/14/2018 CLINICAL DATA:  Epigastric pain, vomiting, suspected acute pancreatitis on CT EXAM: MRI ABDOMEN WITHOUT CONTRAST TECHNIQUE: Multiplanar multisequence MR imaging was performed without the administration of intravenous contrast. COMPARISON:  CT abdomen/pelvis dated 02/13/2018 FINDINGS: Lower chest: Trace bilateral pleural effusions, left greater than right. Hepatobiliary: Liver is within normal limits. Layering small gallstones (series 3/image 40), without  gallbladder wall thickening or pericholecystic fluid. No intrahepatic or extrahepatic ductal dilatation. Common duct measures 4 mm. No choledocholithiasis is seen. Pancreas: Peripancreatic fluid/inflammatory changes, suggesting acute pancreatitis. No pancreatic ductal dilatation. No discrete fluid collection/pseudocyst. Spleen:  Within normal limits. Adrenals/Urinary Tract:  Adrenal glands are within normal limits. Kidneys are within normal limits. Perinephric fluid along the left kidney is likely reactive. Stomach/Bowel: Stomach and visualized bowel are grossly unremarkable. Vascular/Lymphatic:  No evidence of abdominal aortic aneurysm. No suspicious abdominal lymphadenopathy. Other: Peripancreatic fluid/edema in the left upper abdomen along the pancreatic tail. Musculoskeletal: No focal osseous lesions. IMPRESSION: Acute pancreatitis.  No drainable fluid collection/pseudocyst. Cholelithiasis, without associated inflammatory changes. No choledocholithiasis is seen. Electronically Signed   By: Julian Hy M.D.   On: 02/14/2018 09:40   Mr 3d Recon At Scanner  Result Date: 03/04/2018 CLINICAL DATA:  Severe upper abdominal pain, status post cholecystectomy 2 weeks ago, elevated LFTs EXAM: MRI ABDOMEN WITHOUT AND WITH CONTRAST (INCLUDING MRCP) TECHNIQUE: Multiplanar multisequence MR imaging of the abdomen was performed both before and after the administration of intravenous contrast. Heavily T2-weighted images of the biliary and pancreatic ducts were obtained, and three-dimensional MRCP images were rendered by post processing. CONTRAST:  6 mL Gadovist IV COMPARISON:  CT abdomen/pelvis dated 03/03/2018. MRI abdomen dated 02/14/2018. FINDINGS: Lower chest: Lung bases are clear. Hepatobiliary: Liver is within normal limits. No suspicious/enhancing hepatic lesions. No hepatic steatosis. Status post cholecystectomy. No fluid collection in the  gallbladder fossa. No intrahepatic or extrahepatic ductal dilatation. No  choledocholithiasis is seen. Pancreas: Within normal limits. No peripancreatic inflammatory changes. Spleen:  Within normal limits. Adrenals/Urinary Tract:  Adrenal glands are within normal limits. Kidneys are within normal limits.  No hydronephrosis. Stomach/Bowel: Stomach is within normal limits. Visualized bowel is unremarkable. Vascular/Lymphatic:  No evidence of abdominal aortic aneurysm. No suspicious abdominal lymphadenopathy. Other:  No abdominal ascites. Musculoskeletal: No focal osseous lesions. IMPRESSION: Status post cholecystectomy. No fluid collection in the gallbladder fossa. No intrahepatic or extrahepatic ductal dilatation. No choledocholithiasis is seen. No peripancreatic inflammatory changes on MRI. Electronically Signed   By: Julian Hy M.D.   On: 03/04/2018 10:07   Mr 3d Recon At Scanner  Result Date: 02/14/2018 CLINICAL DATA:  Epigastric pain, vomiting, suspected acute pancreatitis on CT EXAM: MRI ABDOMEN WITHOUT CONTRAST TECHNIQUE: Multiplanar multisequence MR imaging was performed without the administration of intravenous contrast. COMPARISON:  CT abdomen/pelvis dated 02/13/2018 FINDINGS: Lower chest: Trace bilateral pleural effusions, left greater than right. Hepatobiliary: Liver is within normal limits. Layering small gallstones (series 3/image 40), without gallbladder wall thickening or pericholecystic fluid. No intrahepatic or extrahepatic ductal dilatation. Common duct measures 4 mm. No choledocholithiasis is seen. Pancreas: Peripancreatic fluid/inflammatory changes, suggesting acute pancreatitis. No pancreatic ductal dilatation. No discrete fluid collection/pseudocyst. Spleen:  Within normal limits. Adrenals/Urinary Tract:  Adrenal glands are within normal limits. Kidneys are within normal limits. Perinephric fluid along the left kidney is likely reactive. Stomach/Bowel: Stomach and visualized bowel are grossly unremarkable. Vascular/Lymphatic:  No evidence of abdominal  aortic aneurysm. No suspicious abdominal lymphadenopathy. Other: Peripancreatic fluid/edema in the left upper abdomen along the pancreatic tail. Musculoskeletal: No focal osseous lesions. IMPRESSION: Acute pancreatitis.  No drainable fluid collection/pseudocyst. Cholelithiasis, without associated inflammatory changes. No choledocholithiasis is seen. Electronically Signed   By: Julian Hy M.D.   On: 02/14/2018 09:40   US Biopsy (liver)  Result Date: 03/06/2018 INDICATION: 47 year old female with a history of abnormal LFTs and elevated bilirubin following laparoscopic cholecystectomy. But no complicating feature could be identified by MRI, MRCP or nuclear medicine HIDA scan. She presents for random liver biopsy for further tissue evaluation. EXAM: Ultrasound-guided core biopsy of the liver Interventional Radiologist:  Criselda Peaches, MD MEDICATIONS: None. ANESTHESIA/SEDATION: Fentanyl 50 mcg IV; Versed 2 mg IV Moderate Sedation Time:  10 minutes The patient was continuously monitored during the procedure by the interventional radiology nurse under my direct supervision. FLUOROSCOPY TIME:  None COMPLICATIONS: None immediate. PROCEDURE: Informed consent was obtained from the patient following explanation of the procedure, risks, benefits and alternatives. The patient understands, agrees and consents for the procedure. All questions were addressed. A time out was performed. The right upper quadrant was interrogated with ultrasound. A relatively avascular plane of the liver was identified. A suitable skin entry site was selected and marked. The region was then sterilely prepped and draped in standard fashion using chlorhexidine skin prep. Local anesthesia was attained by infiltration with 1% lidocaine. A small dermatotomy was made. Under real-time sonographic guidance, a 17 gauge trocar needle was advanced into the liver. Multiple 18 gauge core biopsies were then coaxially obtained. Needle placement was  confirmed on all biopsy passes with real-time sonography. Biopsy specimens were placed in formalin and delivered to pathology for further analysis. As the introducer needle was removed, the biopsy tract was embolized with a Gel-Foam slurry. Post biopsy ultrasound imaging demonstrates no active bleeding or perihepatic hematoma. The patient tolerated the procedure well. IMPRESSION: Technically successful ultrasound-guided random core biopsy  of the liver. Electronically Signed   By: Jacqulynn Cadet M.D.   On: 03/06/2018 14:38   Mr Abdomen Mrcp Moise Boring Contast  Result Date: 03/04/2018 CLINICAL DATA:  Severe upper abdominal pain, status post cholecystectomy 2 weeks ago, elevated LFTs EXAM: MRI ABDOMEN WITHOUT AND WITH CONTRAST (INCLUDING MRCP) TECHNIQUE: Multiplanar multisequence MR imaging of the abdomen was performed both before and after the administration of intravenous contrast. Heavily T2-weighted images of the biliary and pancreatic ducts were obtained, and three-dimensional MRCP images were rendered by post processing. CONTRAST:  6 mL Gadovist IV COMPARISON:  CT abdomen/pelvis dated 03/03/2018. MRI abdomen dated 02/14/2018. FINDINGS: Lower chest: Lung bases are clear. Hepatobiliary: Liver is within normal limits. No suspicious/enhancing hepatic lesions. No hepatic steatosis. Status post cholecystectomy. No fluid collection in the gallbladder fossa. No intrahepatic or extrahepatic ductal dilatation. No choledocholithiasis is seen. Pancreas: Within normal limits. No peripancreatic inflammatory changes. Spleen:  Within normal limits. Adrenals/Urinary Tract:  Adrenal glands are within normal limits. Kidneys are within normal limits.  No hydronephrosis. Stomach/Bowel: Stomach is within normal limits. Visualized bowel is unremarkable. Vascular/Lymphatic:  No evidence of abdominal aortic aneurysm. No suspicious abdominal lymphadenopathy. Other:  No abdominal ascites. Musculoskeletal: No focal osseous lesions.  IMPRESSION: Status post cholecystectomy. No fluid collection in the gallbladder fossa. No intrahepatic or extrahepatic ductal dilatation. No choledocholithiasis is seen. No peripancreatic inflammatory changes on MRI. Electronically Signed   By: Julian Hy M.D.   On: 03/04/2018 10:07    Microbiology: No results found for this or any previous visit (from the past 240 hour(s)).   Labs: Basic Metabolic Panel: Recent Labs  Lab 03/04/18 0328 03/04/18 1121 03/05/18 0341 03/06/18 0423 03/07/18 0923  NA 140 138 139 140 138  K 3.6 3.5 3.5 3.5 3.5  CL 108 106 108 108 105  CO2 _0 GLUCOSE 114* 109* 96 102* 177*  BUN 8 8 5* 8 8  CREATININE 0.70 0.63 0.62 0.81 0.84  CALCIUM 8.3* 8.1* 8.1* 8.4* 8.6*   Liver Function Tests: Recent Labs  Lab 03/04/18 0328 03/04/18 1121 03/05/18 0341 03/06/18 0423 03/07/18 0923  AST 350* 335* 202* 86* 84*  ALT 219* 247* 212* 173* 154*  ALKPHOS 116 142* 150* 173* 167*  BILITOT 4.4* 6.4* 5.1* 1.9* 2.5*  PROT 6.3* 6.5 5.9* 6.5 6.9  ALBUMIN 3.4* 3.4* 3.1* 3.2* 3.5   Recent Labs  Lab 03/03/18 2130 03/04/18 0328  LIPASE 24 23   No results for input(s): AMMONIA in the last 168 hours. CBC: Recent Labs  Lab 03/03/18 2130 03/04/18 0328 03/05/18 0341 03/06/18 0423 03/07/18 0923  WBC 27.9* 17.6* 6.3 10.3 10.6*  NEUTROABS  --   --  4.3 7.1 7.6  HGB 12.6 10.7* 10.5* 11.1* 11.6*  HCT 38.7 33.4* 32.8* 34.4* 35.6*  MCV 87.0 87.4 87.0 88.9 87.0  PLT 400 356 311 324 344   Cardiac Enzymes: No results for input(s): CKTOTAL, CKMB, CKMBINDEX, TROPONINI in the last 168 hours. BNP: BNP (last 3 results) No results for input(s): BNP in the last 8760 hours.  ProBNP (last 3 results) No results for input(s): PROBNP in the last 8760 hours.  CBG: No results for input(s): GLUCAP in the last 168 hours.     Signed:  Alma Friendly, MD [facility] 03/07/2018, 11:36 AM

## 2018-03-07 NOTE — Progress Notes (Signed)
Discussed with patient and spouse discharge instructions, both verbalized agreement and understanding.  Patient to go home in private vehicle with all belongings. 

## 2018-03-07 NOTE — Progress Notes (Addendum)
Attending physician's note   I have taken an interval history, reviewed the chart and examined the patient. I agree with the Advanced Practitioner's note, impression and recommendations.   47 year old with painful obstructive jaundice and abnLFTs afteruneventful lap cholewithgood quality IOC d/tbiliary pancreatitis on10/28/2019 (Nl LFTs at that time). Neg post-op CT, MRCP/MRI for leak, biliary dilatation,intra-abdominal fluid collection or pancreatitis.Has increased WBC count but no fever/chills. Neg HIDA scan. S/P liver biopsy yesterday.  Responded very well to antibiotics.  Could have had infectious ascending cholangitis after cholangiography.  Still the diagnosis is not clear. She feels "normal" now.  Plan: Continue A/Bs x 7 days more, can take probiotics, FU in 1-2 weeks, prior to FU recheck CMP,CBC as an outpatient, follow liver Bx results.  If still with problems, would consider outpatient EUS.  D/w in detail with the patient and patient's husband.  Carmell Austria, MD    Progress Note   Subjective  Chief Complaint: Elevated LFTs, epigastric pain  Today, patient reports that she is feeling well.  She does have slight discomfort in the area of her liver biopsy but overall just wants to go home.    Objective   Vital signs in last 24 hours: Temp:  [98.1 F (36.7 C)-99.1 F (37.3 C)] 99.1 F (37.3 C) (11/16 0557) Pulse Rate:  [56-74] 61 (11/16 0557) Resp:  [10-16] 16 (11/16 0557) BP: (105-122)/(64-89) 109/64 (11/16 0557) SpO2:  [98 %-100 %] 98 % (11/16 0557) Last BM Date: 03/06/18 General:    white female in NAD Heart:  Regular rate and rhythm; no murmurs Lungs: Respirations even and unlabored, lungs CTA bilaterally Abdomen:  Soft, mild right upper quadrant TTP and nondistended. Normal bowel sounds. Extremities:  Without edema. Neurologic:  Alert and oriented,  grossly normal neurologically. Psych:  Cooperative. Normal mood and affect.  Intake/Output from  previous day: 11/15 0701 - 11/16 0700 In: 700.3 [P.O.:600; I.V.:0.3; IV Piggyback:100] Out: 2900 [Urine:2900]  Lab Results: Recent Labs    03/05/18 0341 03/06/18 0423 03/07/18 0923  WBC 6.3 10.3 10.6*  HGB 10.5* 11.1* 11.6*  HCT 32.8* 34.4* 35.6*  PLT 311 324 344   BMET Recent Labs    03/04/18 1121 03/05/18 0341 03/06/18 0423  NA 138 139 140  K 3.5 3.5 3.5  CL 106 108 108  CO2 '22 23 22  ' GLUCOSE 109* 96 102*  BUN 8 5* 8  CREATININE 0.63 0.62 0.81  CALCIUM 8.1* 8.1* 8.4*   LFT Recent Labs    03/06/18 0423  PROT 6.5  ALBUMIN 3.2*  AST 86*  ALT 173*  ALKPHOS 173*  BILITOT 1.9*   PT/INR Recent Labs    03/05/18 1100  LABPROT 13.0  INR 0.99    Studies/Results: US Biopsy (liver)  Result Date: 03/06/2018 INDICATION: 47 year old female with a history of abnormal LFTs and elevated bilirubin following laparoscopic cholecystectomy. But no complicating feature could be identified by MRI, MRCP or nuclear medicine HIDA scan. She presents for random liver biopsy for further tissue evaluation. EXAM: Ultrasound-guided core biopsy of the liver Interventional Radiologist:  Criselda Peaches, MD MEDICATIONS: None. ANESTHESIA/SEDATION: Fentanyl 50 mcg IV; Versed 2 mg IV Moderate Sedation Time:  10 minutes The patient was continuously monitored during the procedure by the interventional radiology nurse under my direct supervision. FLUOROSCOPY TIME:  None COMPLICATIONS: None immediate. PROCEDURE: Informed consent was obtained from the patient following explanation of the procedure, risks, benefits and alternatives. The patient understands, agrees and consents for the procedure. All  questions were addressed. A time out was performed. The right upper quadrant was interrogated with ultrasound. A relatively avascular plane of the liver was identified. A suitable skin entry site was selected and marked. The region was then sterilely prepped and draped in standard fashion using chlorhexidine  skin prep. Local anesthesia was attained by infiltration with 1% lidocaine. A small dermatotomy was made. Under real-time sonographic guidance, a 17 gauge trocar needle was advanced into the liver. Multiple 18 gauge core biopsies were then coaxially obtained. Needle placement was confirmed on all biopsy passes with real-time sonography. Biopsy specimens were placed in formalin and delivered to pathology for further analysis. As the introducer needle was removed, the biopsy tract was embolized with a Gel-Foam slurry. Post biopsy ultrasound imaging demonstrates no active bleeding or perihepatic hematoma. The patient tolerated the procedure well. IMPRESSION: Technically successful ultrasound-guided random core biopsy of the liver. Electronically Signed   By: Jacqulynn Cadet M.D.   On: 03/06/2018 14:38       Assessment / Plan:   Assessment: 1.  Epigastric pain with elevated LFTs: Again status post cholecystectomy 10/28, LFTs trending down, epigastric pain improved, MRCP, CT and HIDA scan all negative, ESR minimally elevated, PT/INR normal, CRP minimally elevated at 2.4, iron studies, B12 alpha-1 antitrypsin, ceruloplasmin, AMA, ASMA and hepatitis panel normal; suspected ascending cholangitis 2.  Leukocytosis: Improved  Plan: 1.  Awaiting results from CMP, I have called the lab as it has been over an hour since they took the specimen, as long as this continues to show LFTs are trending down, patient may be discharged this morning.  Did discuss this with her. 2.  Patient will have follow-up in her outpatient clinic with Dr. Lyndel Safe in the next 1-2 weeks (she will get a call from [place] on Monday to arrange).  Will discuss liver biopsy results as well as other liver tests. 3.  Patient will likely need repeat LFTs at that office visit as well 4.  Dr. Lyndel Safe recommends Levaquin 500 mg daily for the next 7 days at time of discharge.  Thank you for your kind consultation.  We will sign off.   LOS: 2 days    Levin Erp  03/07/2018, 10:44 AM

## 2018-03-09 ENCOUNTER — Telehealth: Payer: Self-pay | Admitting: Gastroenterology

## 2018-03-09 ENCOUNTER — Telehealth: Payer: Self-pay

## 2018-03-09 NOTE — Telephone Encounter (Signed)
Mr. [name] called regarding pt, he would like to speak with Dr. Urban GibsonGupta's nurse regarding antibiotic that Dr. Chales AbrahamsGupta prescribed at the hospital.

## 2018-03-09 NOTE — Telephone Encounter (Signed)
03/23/18 830 am Dr Chales AbrahamsGupta The patient has been notified of this information and all questions answered.

## 2018-03-09 NOTE — Telephone Encounter (Signed)
Called and spoke with patient and patient's husband-patient reported she is hesitant to take the Levaquin that was prescribed at discharge due to the numerous side effects associated with the medication, namely-achilles tendonitis and arthritis (which the patient has); patient's husband is leery of the patient taking the antibiotic due to the lack of a definitive diagnosis also; patient is willing to take an antibiotic but is requesting to have it changed to a different class of antibiotic such as the "cillins"-pt has never had a reaction from any of those types of antibiotics- Pt reported she has not taken/has any antibiotics since having her cholecystectomy removed and being discharged from the hospital; pt reports she is feeling better each day and wonders if the antibiotics will even help since there is not a definitive diagnosis;  Do you think you would be able to call the patient (which is what the husband is requesting in the first place) and give a more concrete reason for the importance of taking an antibiotic or would you like me to call again (patient is wanting a reason as to why the antibiotic and wants it changed to a different class)?

## 2018-03-09 NOTE — Telephone Encounter (Signed)
-----   Message from Unk LightningJennifer Lynne Lemmon, [place] sent at 03/07/2018 10:49 AM EST ----- Regarding: Follow up appt Needs follow up with Dr. Chales AbrahamsGupta in 1-2 weeks for elevated lfts. Being discharged today, told her we would call Monday to arrange.  Thanks-JLL

## 2018-03-10 ENCOUNTER — Other Ambulatory Visit: Payer: Self-pay

## 2018-03-10 DIAGNOSIS — K8309 Other cholangitis: Secondary | ICD-10-CM

## 2018-03-10 MED ORDER — AMOXICILLIN-POT CLAVULANATE 500-125 MG PO TABS: 1.0000 | ORAL_TABLET | Freq: Three times a day (TID) | ORAL | 0 refills | Status: DC

## 2018-03-10 NOTE — Telephone Encounter (Signed)
Called and spoke with patient- informed patient of MD recommendations; patient is agreeable to take Augmentin as prescribed; Rx submitted to pharmacy; patient verbalized understanding of information/instructions; patient understands the liver bx results are still pending; patient advised to call back if questions/concerns arise;

## 2018-03-10 NOTE — Telephone Encounter (Signed)
The other choice is  Augmentin 500 mg p.o. 3 times daily x 5 days. It appears that she had ascending cholangitis and responded very well to Zosyn. Liver biopsy still pending Please let me know if they still have questions Thanks for your help

## 2018-03-12 DIAGNOSIS — K7200 Acute and subacute hepatic failure without coma: Secondary | ICD-10-CM | POA: Diagnosis not present

## 2018-03-13 ENCOUNTER — Telehealth: Payer: Self-pay | Admitting: Gastroenterology

## 2018-03-13 NOTE — Telephone Encounter (Signed)
Once these results are obtained, Dr. Chales AbrahamsGupta will review them, and the patient will be notified of the MD recommendations;

## 2018-03-16 NOTE — Telephone Encounter (Signed)
Briana, I cannot find the CBC report I am sure they would have checked CMP also If we can track those results down I am sure Dois DavenportSandra is feeling well.

## 2018-03-17 NOTE — Telephone Encounter (Signed)
[name], have you seen any of the lab reports the patient states she had her primary send to Dr. Chales AbrahamsGupta? If not, can you see if we can get these results?

## 2018-03-18 NOTE — Telephone Encounter (Signed)
I have not seen the lab results. I have faxed a request to Dr. Paulino RilyWolters office for these results.

## 2018-03-23 ENCOUNTER — Other Ambulatory Visit (INDEPENDENT_AMBULATORY_CARE_PROVIDER_SITE_OTHER): Payer: BLUE CROSS/BLUE SHIELD

## 2018-03-23 ENCOUNTER — Ambulatory Visit: Payer: BLUE CROSS/BLUE SHIELD | Admitting: Gastroenterology

## 2018-03-23 ENCOUNTER — Encounter: Payer: Self-pay | Admitting: Gastroenterology

## 2018-03-23 VITALS — BP 124/66 | HR 64 | Ht 65.0 in | Wt 134.0 lb

## 2018-03-23 DIAGNOSIS — R945 Abnormal results of liver function studies: Secondary | ICD-10-CM | POA: Diagnosis not present

## 2018-03-23 DIAGNOSIS — R7989 Other specified abnormal findings of blood chemistry: Secondary | ICD-10-CM

## 2018-03-23 LAB — COMPREHENSIVE METABOLIC PANEL
ALT: 15 U/L (ref 0–35)
AST: 16 U/L (ref 0–37)
Albumin: 4.4 g/dL (ref 3.5–5.2)
Alkaline Phosphatase: 74 U/L (ref 39–117)
BUN: 16 mg/dL (ref 6–23)
CO2: 28 mEq/L (ref 19–32)
Calcium: 9.4 mg/dL (ref 8.4–10.5)
Chloride: 103 mEq/L (ref 96–112)
Creatinine, Ser: 0.72 mg/dL (ref 0.40–1.20)
GFR: 92.73 mL/min (ref >60.00)
Glucose, Bld: 82 mg/dL (ref 70–99)
Potassium: 4.5 mEq/L (ref 3.5–5.1)
Sodium: 139 mEq/L (ref 135–145)
Total Bilirubin: 0.7 mg/dL (ref 0.2–1.2)
Total Protein: 7.4 g/dL (ref 6.0–8.3)

## 2018-03-23 LAB — CBC WITH DIFFERENTIAL/PLATELET
Basophils Absolute: 0.1 10*3/uL (ref 0.0–0.1)
Basophils Relative: 1 % (ref 0.0–3.0)
Eosinophils Absolute: 0.3 10*3/uL (ref 0.0–0.7)
Eosinophils Relative: 2.8 % (ref 0.0–5.0)
HCT: 38.4 % (ref 36.0–46.0)
Hemoglobin: 12.9 g/dL (ref 12.0–15.0)
Lymphocytes Relative: 20.9 % (ref 12.0–46.0)
Lymphs Abs: 2 10*3/uL (ref 0.7–4.0)
MCHC: 33.4 g/dL (ref 30.0–36.0)
MCV: 86.9 fl (ref 78.0–100.0)
Monocytes Absolute: 0.6 10*3/uL (ref 0.1–1.0)
Monocytes Relative: 6.1 % (ref 3.0–12.0)
Neutro Abs: 6.7 10*3/uL (ref 1.4–7.7)
Neutrophils Relative %: 69.2 % (ref 43.0–77.0)
Platelets: 301 10*3/uL (ref 150.0–400.0)
RBC: 4.42 Mil/uL (ref 3.87–5.11)
RDW: 14.1 % (ref 11.5–15.5)
WBC: 9.7 10*3/uL (ref 4.0–10.5)

## 2018-03-23 NOTE — Progress Notes (Signed)
Chief Complaint: Follow-up  Referring Provider:  Mila PalmerWolters, Sharon, MD      ASSESSMENT AND PLAN;   47 year old with epigastric pain,obstructive jaundice and abnLFTs afteruneventful lap cholewithgood quality IOC d/tbiliary pancreatitis on10/28/2019 (Nl LFTs at that time). Neg post-op CT, MRCP/MRI for leak, biliary dilatation,intra-abdominal fluid collection or pancreatitis.Neg HIDAscan, neg liver biopsy. Had increased WBC count and abn LFTs likely due to ascending cholangitis.   Doubt if there are any retained CBD stones.  Responded very well to antibiotics. Still the diagnosis is not entirely clear.  She has finished a course of Levaquin. [facility].  Plan:  - Check CBC with diff, CMP today. - If still with problems, would consider EUS.   - Discussed with the patient and patient's husband in detail. - Can start exercising like cardio work-up.  - As per current recommendations, would recommend screening colonoscopy at the age of 47.  HPI:    [name] is a 47 y.o. female  For follow-up visit Accompanied by her husband. Doing very well. Feels great. No jaundice dark urine or pale stools.  No fever or chills. No abdominal pain except for burning sensation at the umbilical scar. No nausea, vomiting, heartburn, regurgitation, odynophagia or dysphagia.  No significant diarrhea or constipation.  There is no melena or hematochezia. No unintentional weight loss.   Past Medical History:  Diagnosis Date  . Allergic rhinitis   . Asthma   . Gallstones 1/09   on CT  . IBS (irritable bowel syndrome)   . Ischemic colitis (HCC) 1/09  . Jejunal intussusception (HCC) 1/09   on CT    Past Surgical History:  Procedure Laterality Date  . CHOLECYSTECTOMY N/A 02/16/2018   Procedure: LAPAROSCOPIC CHOLECYSTECTOMY WITH INTRAOPERATIVE CHOLANGIOGRAM;  Surgeon: Darnell LevelGerkin, Todd, MD;  Location: [facility];  Service: General;  Laterality: N/A;  . COLONOSCOPY  1/09    Family  History  Problem Relation Age of Onset  . Diabetes Mellitus II Neg Hx   . Hypertension Neg Hx     Social History   Tobacco Use  . Smoking status: Never Smoker  . Smokeless tobacco: Never Used  Substance Use Topics  . Alcohol use: Yes    Comment: occasional  . Drug use: No    Current Outpatient Medications  Medication Sig Dispense Refill  . calcium-vitamin D (OSCAL WITH D) 500-200 MG-UNIT tablet Take 2 tablets by mouth daily with breakfast.    . cyclobenzaprine (FLEXERIL) 10 MG tablet Take 10 mg by mouth 3 (three) times daily as needed for muscle spasms.     . diclofenac (VOLTAREN) 75 MG EC tablet Take 75 mg by mouth 2 (two) times daily as needed for mild pain.     . fexofenadine (ALLEGRA) 180 MG tablet Take 180 mg by mouth daily.    Marland Kitchen. GLUCOSAMINE-CHONDROITIN PO Take 1 tablet by mouth daily.    . montelukast (SINGULAIR) 10 MG tablet Take 10 mg by mouth daily.     . Probiotic Product (ALIGN) 4 MG CAPS Take 1 capsule by mouth daily.    . VENTOLIN HFA 108 (90 BASE) MCG/ACT inhaler Inhale 2 puffs into the lungs every 4 (four) hours as needed for wheezing or shortness of breath. Before exercise     Current Facility-Administered Medications  Medication Dose Route Frequency Provider Last Rate Last Dose  . triamcinolone acetonide (KENALOG) 10 MG/ML injection 10 mg  10 mg Other Once Lenn Sinkegal, Norman S, DPM        No Known  Allergies  Review of Systems:  neg     Physical Exam:    BP 124/66   Pulse 64   Ht 5\' 5"  (1.651 m)   Wt 134 lb (60.8 kg)   BMI 22.30 kg/m  Filed Weights   03/23/18 0848  Weight: 134 lb (60.8 kg)   Constitutional:  Well-developed, in no acute distress. Psychiatric: Normal mood and affect. Behavior is normal. HEENT: Pupils normal.  Conjunctivae are normal. No scleral icterus. Cardiovascular: Normal rate, regular rhythm. No edema Pulmonary/chest: Effort normal and breath sounds normal. No wheezing, rales or rhonchi. Abdominal: Soft, nondistended. Nontender.  Bowel sounds active throughout. There are no masses palpable. No hepatomegaly. Rectal:  defered Neurological: Alert and oriented to person place and time. Skin: Skin is warm and dry. No rashes noted.  Data Reviewed: I have personally reviewed following labs and imaging studies    Radiology Studies: Nm Hepatobiliary Liver Func  Result Date: 03/05/2018 CLINICAL DATA:  Laparoscopic cholecystectomy 02/16/2018. Abdominal pain. Elevated bilirubin. EXAM: NUCLEAR MEDICINE HEPATOBILIARY IMAGING TECHNIQUE: Sequential images of the abdomen were obtained out to 60 minutes following intravenous administration of radiopharmaceutical. RADIOPHARMACEUTICALS:  7.6 mCi Tc-55m  Choletec IV COMPARISON:  MRI 03/04/2018.  CT 03/03/2018 FINDINGS: Prompt uptake and biliary excretion of activity by the liver is seen. There is normal drainage of the common bile duct into the small bowel. No evidence of biliary obstruction or bowel leak. IMPRESSION: No evidence of biliary obstruction or bile leak. Electronically Signed   By: Charlett Nose M.D.   On: 03/05/2018 10:40   Ct Abdomen Pelvis W Contrast  Result Date: 03/03/2018 CLINICAL DATA:  Mid abdominal pain with nausea and vomiting. History of laparoscopic cholecystectomy on 02/16/2018 for cholelithiasis and biliary pancreatitis. EXAM: CT ABDOMEN AND PELVIS WITH CONTRAST TECHNIQUE: Multidetector CT imaging of the abdomen and pelvis was performed using the standard protocol following bolus administration of intravenous contrast. CONTRAST:  ISOVUE-300 IOPAMIDOL (ISOVUE-300) INJECTION 61% COMPARISON:  02/13/2018 FINDINGS: Lower chest: No acute abnormality. Hepatobiliary: No focal liver abnormality is seen. Status post cholecystectomy. No biliary dilatation. No abnormal fluid collections are identified post cholecystectomy. Cystic duct clips are normally positioned and show no obvious evidence of migration by CT. Pancreas: Pancreatic inflammation has resolved since the prior  CT. No mass or pancreatic ductal dilatation. No evidence of pseudocyst. Spleen: Normal in size without focal abnormality. Adrenals/Urinary Tract: Adrenal glands are unremarkable. Kidneys are normal, without renal calculi, focal lesion, or hydronephrosis. Bladder is unremarkable. Stomach/Bowel: No evidence of bowel obstruction, ileus or inflammation. No free air. No bowel lesions identified. Vascular/Lymphatic: No significant vascular findings are present. No enlarged abdominal or pelvic lymph nodes. Reproductive: Uterus and bilateral adnexa are unremarkable. Other: No abdominal wall hernia or abnormality. No focal abscess or ascites. Musculoskeletal: Stable degenerative disc disease at L5-S1. IMPRESSION: No acute findings or complications evident after recent cholecystectomy. Previously noted pancreatic inflammation has resolved by imaging. Electronically Signed   By: Irish Lack M.D.   On: 03/03/2018 16:29   Mr 3d Recon At Scanner  Result Date: 03/04/2018 CLINICAL DATA:  Severe upper abdominal pain, status post cholecystectomy 2 weeks ago, elevated LFTs EXAM: MRI ABDOMEN WITHOUT AND WITH CONTRAST (INCLUDING MRCP) TECHNIQUE: Multiplanar multisequence MR imaging of the abdomen was performed both before and after the administration of intravenous contrast. Heavily T2-weighted images of the biliary and pancreatic ducts were obtained, and three-dimensional MRCP images were rendered by post processing. CONTRAST:  6 mL Gadovist IV COMPARISON:  CT abdomen/pelvis dated 03/03/2018. MRI  abdomen dated 02/14/2018. FINDINGS: Lower chest: Lung bases are clear. Hepatobiliary: Liver is within normal limits. No suspicious/enhancing hepatic lesions. No hepatic steatosis. Status post cholecystectomy. No fluid collection in the gallbladder fossa. No intrahepatic or extrahepatic ductal dilatation. No choledocholithiasis is seen. Pancreas: Within normal limits. No peripancreatic inflammatory changes. Spleen:  Within normal  limits. Adrenals/Urinary Tract:  Adrenal glands are within normal limits. Kidneys are within normal limits.  No hydronephrosis. Stomach/Bowel: Stomach is within normal limits. Visualized bowel is unremarkable. Vascular/Lymphatic:  No evidence of abdominal aortic aneurysm. No suspicious abdominal lymphadenopathy. Other:  No abdominal ascites. Musculoskeletal: No focal osseous lesions. IMPRESSION: Status post cholecystectomy. No fluid collection in the gallbladder fossa. No intrahepatic or extrahepatic ductal dilatation. No choledocholithiasis is seen. No peripancreatic inflammatory changes on MRI. Electronically Signed   By: Charline Bills M.D.   On: 03/04/2018 10:07   US Biopsy (liver)  Result Date: 03/06/2018 INDICATION: 47 year old female with a history of abnormal LFTs and elevated bilirubin following laparoscopic cholecystectomy. But no complicating feature could be identified by MRI, MRCP or nuclear medicine HIDA scan. She presents for random liver biopsy for further tissue evaluation. EXAM: Ultrasound-guided core biopsy of the liver Interventional Radiologist:  Sterling Big, MD MEDICATIONS: None. ANESTHESIA/SEDATION: Fentanyl 50 mcg IV; Versed 2 mg IV Moderate Sedation Time:  10 minutes The patient was continuously monitored during the procedure by the interventional radiology nurse under my direct supervision. FLUOROSCOPY TIME:  None COMPLICATIONS: None immediate. PROCEDURE: Informed consent was obtained from the patient following explanation of the procedure, risks, benefits and alternatives. The patient understands, agrees and consents for the procedure. All questions were addressed. A time out was performed. The right upper quadrant was interrogated with ultrasound. A relatively avascular plane of the liver was identified. A suitable skin entry site was selected and marked. The region was then sterilely prepped and draped in standard fashion using chlorhexidine skin prep. Local anesthesia  was attained by infiltration with 1% lidocaine. A small dermatotomy was made. Under real-time sonographic guidance, a 17 gauge trocar needle was advanced into the liver. Multiple 18 gauge core biopsies were then coaxially obtained. Needle placement was confirmed on all biopsy passes with real-time sonography. Biopsy specimens were placed in formalin and delivered to pathology for further analysis. As the introducer needle was removed, the biopsy tract was embolized with a Gel-Foam slurry. Post biopsy ultrasound imaging demonstrates no active bleeding or perihepatic hematoma. The patient tolerated the procedure well. IMPRESSION: Technically successful ultrasound-guided random core biopsy of the liver. Electronically Signed   By: Malachy Moan M.D.   On: 03/06/2018 14:38   Mr Abdomen Mrcp Vivien Rossetti Contast  Result Date: 03/04/2018 CLINICAL DATA:  Severe upper abdominal pain, status post cholecystectomy 2 weeks ago, elevated LFTs EXAM: MRI ABDOMEN WITHOUT AND WITH CONTRAST (INCLUDING MRCP) TECHNIQUE: Multiplanar multisequence MR imaging of the abdomen was performed both before and after the administration of intravenous contrast. Heavily T2-weighted images of the biliary and pancreatic ducts were obtained, and three-dimensional MRCP images were rendered by post processing. CONTRAST:  6 mL Gadovist IV COMPARISON:  CT abdomen/pelvis dated 03/03/2018. MRI abdomen dated 02/14/2018. FINDINGS: Lower chest: Lung bases are clear. Hepatobiliary: Liver is within normal limits. No suspicious/enhancing hepatic lesions. No hepatic steatosis. Status post cholecystectomy. No fluid collection in the gallbladder fossa. No intrahepatic or extrahepatic ductal dilatation. No choledocholithiasis is seen. Pancreas: Within normal limits. No peripancreatic inflammatory changes. Spleen:  Within normal limits. Adrenals/Urinary Tract:  Adrenal glands are within normal limits. Kidneys are  within normal limits.  No hydronephrosis.  Stomach/Bowel: Stomach is within normal limits. Visualized bowel is unremarkable. Vascular/Lymphatic:  No evidence of abdominal aortic aneurysm. No suspicious abdominal lymphadenopathy. Other:  No abdominal ascites. Musculoskeletal: No focal osseous lesions. IMPRESSION: Status post cholecystectomy. No fluid collection in the gallbladder fossa. No intrahepatic or extrahepatic ductal dilatation. No choledocholithiasis is seen. No peripancreatic inflammatory changes on MRI. Electronically Signed   By: Charline Bills M.D.   On: 03/04/2018 10:07  I spent 15 minutes of face-to-face time with the patient. Greater than 50% of the time was spent counseling and coordinating care.     Edman Circle, MD 03/23/2018, 8:54 AM  Cc: Mila Palmer, MD

## 2018-03-23 NOTE — Patient Instructions (Signed)
If you are age 45 or older, your body mass index should be between 23-30. Your Body mass index is 22.3 kg/m. If this is out of the aforementioned range listed, please consider follow up with your Primary Care Provider.  If you are age 45 or younger, your body mass index should be between 19-25. Your Body mass index is 22.3 kg/m. If this is out of the aformentioned range listed, please consider follow up with your Primary Care Provider.   Please go to the lab on the 2nd floor [address] before you leave the office today.   Thank you,  Dr. Lynann Bolognaajesh Gupta

## 2018-04-22 DIAGNOSIS — K5792 Diverticulitis of intestine, part unspecified, without perforation or abscess without bleeding: Secondary | ICD-10-CM

## 2018-04-22 HISTORY — DX: Diverticulitis of intestine, part unspecified, without perforation or abscess without bleeding: K57.92

## 2018-06-11 DIAGNOSIS — M79643 Pain in unspecified hand: Secondary | ICD-10-CM | POA: Diagnosis not present

## 2018-06-11 DIAGNOSIS — L659 Nonscarring hair loss, unspecified: Secondary | ICD-10-CM | POA: Diagnosis not present

## 2018-06-16 DIAGNOSIS — Z3009 Encounter for other general counseling and advice on contraception: Secondary | ICD-10-CM | POA: Diagnosis not present

## 2018-06-16 DIAGNOSIS — N920 Excessive and frequent menstruation with regular cycle: Secondary | ICD-10-CM | POA: Diagnosis not present

## 2018-08-28 DIAGNOSIS — Z01419 Encounter for gynecological examination (general) (routine) without abnormal findings: Secondary | ICD-10-CM | POA: Diagnosis not present

## 2018-08-28 DIAGNOSIS — Z1231 Encounter for screening mammogram for malignant neoplasm of breast: Secondary | ICD-10-CM | POA: Diagnosis not present

## 2018-08-28 DIAGNOSIS — Z6823 Body mass index (BMI) 23.0-23.9, adult: Secondary | ICD-10-CM | POA: Diagnosis not present

## 2018-11-19 DIAGNOSIS — J3089 Other allergic rhinitis: Secondary | ICD-10-CM | POA: Diagnosis not present

## 2018-11-19 DIAGNOSIS — J45990 Exercise induced bronchospasm: Secondary | ICD-10-CM | POA: Diagnosis not present

## 2018-11-19 DIAGNOSIS — J301 Allergic rhinitis due to pollen: Secondary | ICD-10-CM | POA: Diagnosis not present

## 2018-11-19 DIAGNOSIS — J3081 Allergic rhinitis due to animal (cat) (dog) hair and dander: Secondary | ICD-10-CM | POA: Diagnosis not present

## 2019-01-05 ENCOUNTER — Encounter (HOSPITAL_BASED_OUTPATIENT_CLINIC_OR_DEPARTMENT_OTHER): Payer: Self-pay | Admitting: Emergency Medicine

## 2019-01-05 ENCOUNTER — Emergency Department (HOSPITAL_BASED_OUTPATIENT_CLINIC_OR_DEPARTMENT_OTHER): Payer: BC Managed Care – PPO

## 2019-01-05 ENCOUNTER — Emergency Department (HOSPITAL_BASED_OUTPATIENT_CLINIC_OR_DEPARTMENT_OTHER)
Admission: EM | Admit: 2019-01-05 | Discharge: 2019-01-05 | Disposition: A | Discharge status: Home / Self Care | Payer: BC Managed Care – PPO | Attending: Emergency Medicine | Admitting: Emergency Medicine

## 2019-01-05 ENCOUNTER — Other Ambulatory Visit: Payer: Self-pay

## 2019-01-05 DIAGNOSIS — K5792 Diverticulitis of intestine, part unspecified, without perforation or abscess without bleeding: Secondary | ICD-10-CM | POA: Diagnosis not present

## 2019-01-05 DIAGNOSIS — R1032 Left lower quadrant pain: Secondary | ICD-10-CM | POA: Diagnosis not present

## 2019-01-05 DIAGNOSIS — J45909 Unspecified asthma, uncomplicated: Secondary | ICD-10-CM | POA: Insufficient documentation

## 2019-01-05 DIAGNOSIS — K5732 Diverticulitis of large intestine without perforation or abscess without bleeding: Secondary | ICD-10-CM | POA: Insufficient documentation

## 2019-01-05 DIAGNOSIS — Z79899 Other long term (current) drug therapy: Secondary | ICD-10-CM | POA: Diagnosis not present

## 2019-01-05 DIAGNOSIS — R109 Unspecified abdominal pain: Secondary | ICD-10-CM | POA: Diagnosis not present

## 2019-01-05 LAB — URINALYSIS, ROUTINE W REFLEX MICROSCOPIC
Bilirubin Urine: NEGATIVE
Glucose, UA: NEGATIVE mg/dL
Hgb urine dipstick: NEGATIVE
Ketones, ur: NEGATIVE mg/dL
Leukocytes,Ua: NEGATIVE
Nitrite: NEGATIVE
Protein, ur: NEGATIVE mg/dL
Specific Gravity, Urine: <1.005 — ABNORMAL LOW (ref 1.005–1.030)
pH: 7 (ref 5.0–8.0)

## 2019-01-05 LAB — COMPREHENSIVE METABOLIC PANEL
ALT: 19 U/L (ref 0–44)
AST: 23 U/L (ref 15–41)
Albumin: 4.1 g/dL (ref 3.5–5.0)
Alkaline Phosphatase: 63 U/L (ref 38–126)
Anion gap: 9 (ref 5–15)
BUN: 20 mg/dL (ref 6–20)
CO2: 24 mmol/L (ref 22–32)
Calcium: 8.7 mg/dL — ABNORMAL LOW (ref 8.9–10.3)
Chloride: 103 mmol/L (ref 98–111)
Creatinine, Ser: 0.76 mg/dL (ref 0.44–1.00)
GFR calc Af Amer: >60 mL/min (ref >60)
GFR calc non Af Amer: >60 mL/min (ref >60)
Glucose, Bld: 112 mg/dL — ABNORMAL HIGH (ref 70–99)
Potassium: 4.2 mmol/L (ref 3.5–5.1)
Sodium: 136 mmol/L (ref 135–145)
Total Bilirubin: 1.4 mg/dL — ABNORMAL HIGH (ref 0.3–1.2)
Total Protein: 7.5 g/dL (ref 6.5–8.1)

## 2019-01-05 LAB — CBC
HCT: 42.1 % (ref 36.0–46.0)
Hemoglobin: 13.8 g/dL (ref 12.0–15.0)
MCH: 29 pg (ref 26.0–34.0)
MCHC: 32.8 g/dL (ref 30.0–36.0)
MCV: 88.4 fL (ref 80.0–100.0)
Platelets: 295 10*3/uL (ref 150–400)
RBC: 4.76 MIL/uL (ref 3.87–5.11)
RDW: 12.8 % (ref 11.5–15.5)
WBC: 19.7 10*3/uL — ABNORMAL HIGH (ref 4.0–10.5)
nRBC: 0 % (ref 0.0–0.2)

## 2019-01-05 LAB — LIPASE, BLOOD: Lipase: 40 U/L (ref 11–51)

## 2019-01-05 LAB — PREGNANCY, URINE: Preg Test, Ur: NEGATIVE

## 2019-01-05 MED ORDER — AMOXICILLIN-POT CLAVULANATE 875-125 MG PO TABS
1.0000 | ORAL_TABLET | Freq: Once | ORAL | Status: AC
  Administered 2019-01-05: 1 via ORAL
  Filled 2019-01-05: qty 1

## 2019-01-05 MED ORDER — ACETAMINOPHEN 325 MG PO TABS
650.0000 mg | ORAL_TABLET | Freq: Once | ORAL | Status: AC
  Administered 2019-01-05: 650 mg via ORAL
  Filled 2019-01-05: qty 2

## 2019-01-05 MED ORDER — SODIUM CHLORIDE 0.9 % IV BOLUS
1000.0000 mL | Freq: Once | INTRAVENOUS | Status: AC
  Administered 2019-01-05: 1000 mL via INTRAVENOUS

## 2019-01-05 MED ORDER — IOHEXOL 300 MG/ML  SOLN
100.0000 mL | Freq: Once | INTRAMUSCULAR | Status: AC | PRN
  Administered 2019-01-05: 100 mL via INTRAVENOUS

## 2019-01-05 MED ORDER — AMOXICILLIN-POT CLAVULANATE 875-125 MG PO TABS: 1.0000 | ORAL_TABLET | Freq: Three times a day (TID) | ORAL | 0 refills | Status: AC

## 2019-01-05 MED FILL — AMOX-CLAV 875-125 MG TABLET: 875-125 | 7 days supply | Qty: 21 | Fill #0

## 2019-01-05 NOTE — Discharge Instructions (Addendum)
If you develop fever, vomiting, worsening pain, or any other new/concerning symptoms then return to the ER for evaluation.

## 2019-01-05 NOTE — ED Notes (Signed)
Patient transported to CT 

## 2019-01-05 NOTE — ED Triage Notes (Signed)
Pt c/o LLQ pain/pressure since yesterday. Denies N/V/D

## 2019-01-05 NOTE — ED Provider Notes (Signed)
[facility] DEPART[facility] Provider Note   CSN: [id number] Arrival date & time: 01/05/19  6301     History   Chief Complaint Chief Complaint  Patient presents with  . Abdominal Pain    HPI [name] is a 48 y.o. female.     HPI  48 year old female with left lower quadrant abdominal pain.  Started yesterday while on a walk and has slowly gotten worse.  Is mild at this point.  No fever, back pain, vomiting, diarrhea, urinary symptoms.  She has not take anything for the pain.  This feels different than the complications of her gallbladder surgery last year.  Past Medical History:  Diagnosis Date  . Allergic rhinitis   . Asthma   . Gallstones 1/09   on CT  . IBS (irritable bowel syndrome)   . Ischemic colitis ([place]) 1/09  . 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 Epigastric pain 03/05/2018  . Elevated LFTs 03/04/2018  . Epigastric abdominal pain 03/04/2018  . Abdominal pain 03/03/2018  . Gallstone pancreatitis 02/13/2018  . Gallstones 02/13/2018  . Acute pancreatitis 02/13/2018  . Lactose intolerance 07/10/2013  . Irritable bowel syndrome 06/25/2007  . ALLERGIC RHINITIS CAUSE UNSPECIFIED 05/13/2007  . Intussusception ([place]) 05/13/2007  . CALCU GALLBLADD W/ACUT CHOLCYST W/O MENTION OBST 05/13/2007  . FLATULENCE ERUCTATION AND GAS PAIN 05/13/2007    Past Surgical History:  Procedure Laterality Date  . CHOLECYSTECTOMY N/A 02/16/2018   Procedure: LAPAROSCOPIC CHOLECYSTECTOMY WITH INTRAOPERATIVE CHOLANGIOGRAM;  Surgeon: Armandina Gemma, MD;  Location: [facility];  Service: General;  Laterality: N/A;  . COLONOSCOPY  1/09     OB History   No obstetric history on file.      Home Medications    Prior to Admission medications   Medication Sig Start Date End Date Taking? Authorizing Provider  montelukast (SINGULAIR) 10 MG tablet Take 10 mg by mouth daily.  06/26/13  Yes [provider]   amoxicillin-clavulanate (AUGMENTIN) 875-125 MG tablet Take 1 tablet by mouth 3 (three) times daily for 7 days. 01/05/19 01/12/19  Sherwood Gambler, MD  calcium-vitamin D (OSCAL WITH D) 500-200 MG-UNIT tablet Take 2 tablets by mouth daily with breakfast.    [provider]  cyclobenzaprine (FLEXERIL) 10 MG tablet Take 10 mg by mouth 3 (three) times daily as needed for muscle spasms.  04/01/13   [provider]  diclofenac (VOLTAREN) 75 MG EC tablet Take 75 mg by mouth 2 (two) times daily as needed for mild pain.     [provider]  fexofenadine (ALLEGRA) 180 MG tablet Take 180 mg by mouth daily.    [provider]  GLUCOSAMINE-CHONDROITIN PO Take 1 tablet by mouth daily.    [provider]  Probiotic Product (ALIGN) 4 MG CAPS Take 1 capsule by mouth daily.    [provider]  VENTOLIN HFA 108 (90 BASE) MCG/ACT inhaler Inhale 2 puffs into the lungs every 4 (four) hours as needed for wheezing or shortness of breath. Before exercise 04/24/13   [provider]    Family History Family History  Problem Relation Age of Onset  . Diabetes Mellitus II Neg Hx   . Hypertension Neg Hx     Social History Social History   Tobacco Use  . Smoking status: Never Smoker  . Smokeless tobacco: Never Used  Substance Use Topics  . Alcohol use: Yes    Comment: occasional  .  Drug use: No     Allergies   Patient has no known allergies.   Review of Systems Review of Systems  Constitutional: Negative for fever.  Gastrointestinal: Positive for abdominal pain. Negative for constipation, diarrhea and vomiting.  Genitourinary: Negative for dysuria and hematuria.  Musculoskeletal: Negative for back pain.  All other systems reviewed and are negative.    Physical Exam Updated Vital Signs BP (!) 131/98 (BP Location: Right Arm)   Pulse 76   Temp 98.3 F (36.8 C) (Oral)   Resp 16   Ht 5\' 5"  (1.651 m)   Wt 61.2 kg   LMP 12/26/2018   SpO2 100%    BMI 22.47 kg/m   Physical Exam Vitals signs and nursing note reviewed.  Constitutional:      Appearance: She is well-developed.  HENT:     Head: Normocephalic and atraumatic.     Right Ear: External ear normal.     Left Ear: External ear normal.     Nose: Nose normal.  Eyes:     General:        Right eye: No discharge.        Left eye: No discharge.  Cardiovascular:     Rate and Rhythm: Normal rate and regular rhythm.     Heart sounds: Normal heart sounds.  Pulmonary:     Effort: Pulmonary effort is normal.     Breath sounds: Normal breath sounds.  Abdominal:     Palpations: Abdomen is soft.     Tenderness: There is abdominal tenderness in the left lower quadrant. There is no right CVA tenderness or left CVA tenderness.  Skin:    General: Skin is warm and dry.  Neurological:     Mental Status: She is alert.  Psychiatric:        Mood and Affect: Mood is not anxious.      ED Treatments / Results  Labs (all labs ordered are listed, but only abnormal results are displayed) Labs Reviewed  COMPREHENSIVE METABOLIC PANEL - Abnormal; Notable for the following components:      Result Value   Glucose, Bld 112 (*)    Calcium 8.7 (*)    Total Bilirubin 1.4 (*)    All other components within normal limits  CBC - Abnormal; Notable for the following components:   WBC 19.7 (*)    All other components within normal limits  URINALYSIS, ROUTINE W REFLEX MICROSCOPIC - Abnormal; Notable for the following components:   Specific Gravity, Urine <1.005 (*)    All other components within normal limits  LIPASE, BLOOD  PREGNANCY, URINE    EKG None  Radiology Ct Abdomen Pelvis W Contrast  Result Date: 01/05/2019 CLINICAL DATA:  Acute left lower quadrant abdominal pain. EXAM: CT ABDOMEN AND PELVIS WITH CONTRAST TECHNIQUE: Multidetector CT imaging of the abdomen and pelvis was performed using the standard protocol following bolus administration of intravenous contrast. CONTRAST:   100mL OMNIPAQUE IOHEXOL 300 MG/ML  SOLN COMPARISON:  CT scan of March 03, 2018. FINDINGS: Lower chest: No acute abnormality. Hepatobiliary: No focal liver abnormality is seen. Status post cholecystectomy. No biliary dilatation. Pancreas: Unremarkable. No pancreatic ductal dilatation or surrounding inflammatory changes. Spleen: Normal in size without focal abnormality. Adrenals/Urinary Tract: Adrenal glands are unremarkable. Kidneys are normal, without renal calculi, focal lesion, or hydronephrosis. Bladder is unremarkable. Stomach/Bowel: Stomach and appendix are unremarkable. There is no evidence of bowel obstruction. Focal diverticulitis is noted in the proximal sigmoid colon without abscess or perforation. Vascular/Lymphatic: No  significant vascular findings are present. No enlarged abdominal or pelvic lymph nodes. Reproductive: Uterus and bilateral adnexa are unremarkable. Other: Small amount of free fluid is noted in the dependent portion of the pelvis. No hernia is noted. Musculoskeletal: No acute or significant osseous findings. IMPRESSION: Sigmoid diverticulitis is noted without abscess or perforation. Electronically Signed   By: Lupita Raider M.D.   On: 01/05/2019 10:12    Procedures Procedures (including critical care time)  Medications Ordered in ED Medications  acetaminophen (TYLENOL) tablet 650 mg (has no administration in time range)  amoxicillin-clavulanate (AUGMENTIN) 875-125 MG per tablet 1 tablet (has no administration in time range)  sodium chloride 0.9 % bolus 1,000 mL (1,000 mLs Intravenous New Bag/Given 01/05/19 0948)  iohexol (OMNIPAQUE) 300 MG/ML solution 100 mL (100 mLs Intravenous Contrast Given 01/05/19 0952)     Initial Impression / Assessment and Plan / ED Course  I have reviewed the triage vital signs and the nursing notes.  Pertinent labs & imaging results that were available during my care of the patient were reviewed by me and considered in my medical decision  making (see chart for details).        CT scan confirms diverticulitis.  No acute complicating factor such as rupture/abscess.  She does have an elevated WBC, but she is pretty well-appearing, no fever, or vomiting.  Given this, I think she is amenable to outpatient treatment.  We discussed strict return precautions.  She is not wanting thing stronger than ibuprofen or Tylenol.  Final Clinical Impressions(s) / ED Diagnoses   Final diagnoses:  Acute diverticulitis    ED Discharge Orders         Ordered    amoxicillin-clavulanate (AUGMENTIN) 875-125 MG tablet  3 times daily     01/05/19 1035           Pricilla Loveless, MD 01/05/19 1036

## 2019-01-08 DIAGNOSIS — K5732 Diverticulitis of large intestine without perforation or abscess without bleeding: Secondary | ICD-10-CM | POA: Diagnosis not present

## 2019-01-18 ENCOUNTER — Other Ambulatory Visit: Payer: Self-pay

## 2019-01-18 ENCOUNTER — Ambulatory Visit (INDEPENDENT_AMBULATORY_CARE_PROVIDER_SITE_OTHER): Payer: BC Managed Care – PPO | Admitting: Gastroenterology

## 2019-01-18 ENCOUNTER — Encounter: Payer: Self-pay | Admitting: Gastroenterology

## 2019-01-18 VITALS — BP 116/82 | HR 70 | Temp 98.4°F | Ht 65.0 in | Wt 139.0 lb

## 2019-01-18 DIAGNOSIS — K5732 Diverticulitis of large intestine without perforation or abscess without bleeding: Secondary | ICD-10-CM

## 2019-01-18 MED ORDER — HYOSCYAMINE SULFATE 0.125 MG SL SUBL: 0.1250 mg | SUBLINGUAL_TABLET | Freq: Four times a day (QID) | SUBLINGUAL | 3 refills | Status: DC | PRN

## 2019-01-18 NOTE — Patient Instructions (Signed)
You have been scheduled for a colonoscopy. Please follow written instructions given to you at your visit today.  Please pick up your prep supplies at the pharmacy within the next 1-3 days. If you use inhalers (even only as needed), please bring them with you on the day of your procedure. Your physician has requested that you go to www.[email or web address] and enter the access code given to you at your visit today. This web site gives a general overview about your procedure. However, you should still follow specific instructions given to you by our office regarding your preparation for the procedure.  Finish Antibiotics.  Continue probiotics.   You have been scheduled for a colonoscopy. Please follow written instructions given to you at your visit today.  Please pick up your prep supplies at the pharmacy within the next 1-3 days. If you use inhalers (even only as needed), please bring them with you on the day of your procedure. Your physician has requested that you go to www.[email or web address] and enter the access code given to you at your visit today. This web site gives a general overview about your procedure. However, you should still follow specific instructions given to you by our office regarding your preparation for the procedure.  We have sent the following medications to your pharmacy for you to pick up at your convenience: Hyoscamine 0.125mg  1-2 tabs sublingual every 6 hours as needed.   Trial of gluten free and lactose free diet in 3 weeks.  Gluten-Free Diet for Celiac Disease, Adult  The gluten-free diet includes all foods that do not contain gluten. Gluten is a protein that is found in wheat, rye, barley, and some other grains. Following the gluten-free diet is the only treatment for people with celiac disease. It helps to prevent damage to the intestines and improves or eliminates the symptoms of celiac disease. Following the gluten-free diet requires some planning. It can be challenging at  first, but it gets easier with time and practice. There are more gluten-free options available today than ever before. If you need help finding gluten-free foods or if you have questions, talk with your diet and nutrition specialist (registered dietitian) or your health care provider. What do I need to know about a gluten-free diet?  All fruits, vegetables, and meats are safe to eat and do not contain gluten.  When grocery shopping, start by shopping in the produce, meat, and dairy sections. These sections are more likely to contain gluten-free foods. Then move to the aisles that contain packaged foods if you need to.  Read all food labels. Gluten is often added to foods. Always check the ingredient list and look for warnings, such as "may contain gluten."  Talk with your dietitian or health care provider before taking a gluten-free multivitamin or mineral supplement.  Be aware of gluten-free foods having contact with foods that contain gluten (cross-contamination). This can happen at home and with any processed foods. ? Talk with your health care provider or dietitian about how to reduce the risk of cross-contamination in your home. ? If you have questions about how a food is processed, ask the manufacturer. What key words help to identify gluten? Foods that list any of these key words on the label usually contain gluten:  Wheat, flour, enriched flour, bromated flour, white flour, durum flour, graham flour, phosphated flour, self-rising flour, semolina, farina, barley (malt), rye, and oats.  Starch, dextrin, modified food starch, or cereal.  Thickening, fillers, or emulsifiers.  Malt flavoring, malt extract, or  malt syrup.  Hydrolyzed vegetable protein. In the [place] packaged foods that are gluten-free are required to be labeled "GF." These foods should be easy to identify and are safe to eat. In the [place] food companies are also required to list common food allergens, including wheat, on  their labels. Recommended foods Grains  Amaranth, bean flours, 100% buckwheat flour, corn, millet, nut flours or nut meals, GF oats, quinoa, rice, sorghum, teff, rice wafers, pure cornmeal tortillas, popcorn, and hot cereals made from cornmeal. Hominy, rice, wild rice. Some Asian rice noodles or bean noodles. Arrowroot starch, corn bran, corn flour, corn germ, cornmeal, corn starch, potato flour, potato starch flour, and rice bran. Plain, brown, and sweet rice flours. Rice polish, soy flour, and tapioca starch. Vegetables  All plain fresh, frozen, and canned vegetables. Fruits  All plain fresh, frozen, canned, and dried fruits, and 100% fruit juices. Meats and other protein foods  All fresh beef, pork, poultry, fish, seafood, and eggs. Fish canned in water, oil, brine, or vegetable broth. Plain nuts and seeds, peanut butter. Some lunch meat and some frankfurters. Dried beans, dried peas, and lentils. Dairy  Fresh plain, dry, evaporated, or condensed milk. Cream, butter, sour cream, whipping cream, and most yogurts. Unprocessed cheese, most processed cheeses, some cottage cheese, some cream cheeses. Beverages  Coffee, tea, most herbal teas. Carbonated beverages and some root beers. Wine, sake, and distilled spirits, such as gin, vodka, and whiskey. Most hard ciders. Fats and oils  Butter, margarine, vegetable oil, hydrogenated butter, olive oil, shortening, lard, cream, and some mayonnaise. Some commercial salad dressings. Olives. Sweets and desserts  Sugar, honey, some syrups, molasses, jelly, and jam. Plain hard candy, marshmallows, and gumdrops. Pure cocoa powder. Plain chocolate. Custard and some pudding mixes. Gelatin desserts, sorbets, frozen ice pops, and sherbet. Cake, cookies, and other desserts prepared with allowed flours. Some commercial ice creams. Cornstarch, tapioca, and rice puddings. Seasoning and other foods  Some canned or frozen soups. Monosodium glutamate (MSG). Cider,  rice, and wine vinegar. Baking soda and baking powder. Cream of tartar. Baking and nutritional yeast. Certain soy sauces made without wheat (ask your dietitian about specific brands that are allowed). Nuts, coconut, and chocolate. Salt, pepper, herbs, spices, flavoring extracts, imitation or artificial flavorings, natural flavorings, and food colorings. Some medicines and supplements. Some lip glosses and other cosmetics. Rice syrups. The items listed may not be a complete list. Talk with your dietitian about what dietary choices are best for you. Foods to avoid Grains  Barley, bran, bulgur, couscous, cracked wheat, [place], farro, graham, malt, matzo, semolina, wheat germ, and all wheat and rye cereals including spelt and kamut. Cereals containing malt as a flavoring, such as rice cereal. Noodles, spaghetti, macaroni, most packaged rice mixes, and all mixes containing wheat, rye, barley, or triticale. Vegetables  Most creamed vegetables and most vegetables canned in sauces. Some commercially prepared vegetables and salads. Fruits  Thickened or prepared fruits and some pie fillings. Some fruit snacks and fruit roll-ups. Meats and other protein foods  Any meat or meat alternative containing wheat, rye, barley, or gluten stabilizers. These are often marinated or packaged meats and lunch meats. Bread-containing products, such as Swiss steak, croquettes, meatballs, and meatloaf. Most tuna canned in vegetable broth and [place] with hydrolyzed vegetable protein (HVP) injected as part of the basting. Seitan. Imitation fish. Eggs in sauces made from ingredients to avoid. Dairy  Commercial chocolate milk drinks and malted milk. Some non-dairy creamers. Any cheese product containing ingredients to avoid.  Beverages  Certain cereal beverages. Beer, ale, malted milk, and some root beers. Some hard ciders. Some instant flavored coffees. Some herbal teas made with barley or with barley malt added. Fats and oils   Some commercial salad dressings. Sour cream containing modified food starch. Sweets and desserts  Some toffees. Chocolate-coated nuts (may be rolled in wheat flour) and some commercial candies and candy bars. Most cakes, cookies, donuts, pastries, and other baked goods. Some commercial ice cream. Ice cream cones. Commercially prepared mixes for cakes, cookies, and other desserts. Bread pudding and other puddings thickened with flour. Products containing brown rice syrup made with barley malt enzyme. Desserts and sweets made with malt flavoring. Seasoning and other foods  Some curry powders, some dry seasoning mixes, some gravy extracts, some meat sauces, some ketchups, some prepared mustards, and horseradish. Certain soy sauces. Malt vinegar. Bouillon and bouillon cubes that contain HVP. Some chip dips, and some chewing gum. Yeast extract. Brewer's yeast. Caramel color. Some medicines and supplements. Some lip glosses and other cosmetics. The items listed may not be a complete list. Talk with your dietitian about what dietary choices are best for you. Summary  Gluten is a protein that is found in wheat, rye, barley, and some other grains. The gluten-free diet includes all foods that do not contain gluten.  If you need help finding gluten-free foods or if you have questions, talk with your diet and nutrition specialist (registered dietitian) or your health care provider.  Read all food labels. Gluten is often added to foods. Always check the ingredient list and look for warnings, such as "may contain gluten." This information is not intended to replace advice given to you by your health care provider. Make sure you discuss any questions you have with your health care provider. Document Released: 04/08/2005 Document Revised: 03/21/2017 Document Reviewed: 01/22/2016 Elsevier Patient Education  2020 Elsevier Inc.  Lactose-Free Diet, Adult If you have lactose intolerance, you are not able to  digest lactose. Lactose is a natural sugar found mainly in dairy milk and dairy products. You may need to avoid all foods and beverages that contain lactose. A lactose-free diet can help you do this. Which foods have lactose? Lactose is found in dairy milk and dairy products, such as:  Yogurt.  Cheese.  Butter.  Margarine.  Sour cream.  Cream.  Whipped toppings and nondairy creamers.  Ice cream and other dairy-based desserts. Lactose is also found in foods or products made with dairy milk or milk ingredients. To find out whether a food contains dairy milk or a milk ingredient, look at the ingredients list. Avoid foods with the statement "May contain milk" and foods that contain:  Milk powder.  Whey.  Curd.  Caseinate.  Lactose.  Lactalbumin.  Lactoglobulin. What are alternatives to dairy milk and foods made with milk products?  Lactose-free milk.  Soy milk with added calcium and vitamin D.  Almond milk, coconut milk, rice milk, or other nondairy milk alternatives with added calcium and vitamin D. Note that these are low in protein.  Soy products, such as soy yogurt, soy cheese, soy ice cream, and soy-based sour cream.  Other nut milk products, such as almond yogurt, almond cheese, cashew yogurt, cashew cheese, cashew ice cream, coconut yogurt, and coconut ice cream. What are tips for following this plan?  Do not consume foods, beverages, vitamins, minerals, or medicines containing lactose. Read ingredient lists carefully.  Look for the words "lactose-free" on labels.  Use lactase enzyme drops or  tablets as directed by your health care provider.  Use lactose-free milk or a milk alternative, such as soy milk or almond milk, for drinking and cooking.  Make sure you get enough calcium and vitamin D in your diet. A lactose-free eating plan can be lacking in these important nutrients.  Take calcium and vitamin D supplements as directed by your health care provider.  Talk to your health care provider about supplements if you are not able to get enough calcium and vitamin D from food. What foods can I eat?  Fruits All fresh, canned, frozen, or dried fruits that are not processed with lactose. Vegetables All fresh, frozen, and canned vegetables without cheese, cream, or butter sauces. Grains Any that are not made with dairy milk or dairy products. Meats and other proteins Any meat, fish, poultry, and other protein sources that are not made with dairy milk or dairy products. Soy cheese and yogurt. Fats and oils Any that are not made with dairy milk or dairy products. Beverages Lactose-free milk. Soy, rice, or almond milk with added calcium and vitamin D. Fruit and vegetable juices. Sweets and desserts Any that are not made with dairy milk or dairy products. Seasonings and condiments Any that are not made with dairy milk or dairy products. Calcium Calcium is found in many foods that contain lactose and is important for bone health. The amount of calcium you need depends on your age:  Adults younger than 50 years: 1,000 mg of calcium a day.  Adults older than 50 years: 1,200 mg of calcium a day. If you are not getting enough calcium, you may get it from other sources, including:  Orange juice with calcium added. There are 300-350 mg of calcium in 1 cup of orange juice.  Calcium-fortified soy milk. There are 300-400 mg of calcium in 1 cup of calcium-fortified soy milk.  Calcium-fortified rice or almond milk. There are 300 mg of calcium in 1 cup of calcium-fortified rice or almond milk.  Calcium-fortified breakfast cereals. There are 100-1,000 mg of calcium in calcium-fortified breakfast cereals.  Spinach, cooked. There are 145 mg of calcium in  cup of cooked spinach.  Edamame, cooked. There are 130 mg of calcium in  cup of cooked edamame.  Collard greens, cooked. There are 125 mg of calcium in  cup of cooked collard greens.  Kale, frozen or  cooked. There are 90 mg of calcium in  cup of cooked or frozen kale.  Almonds. There are 95 mg of calcium in  cup of almonds.  Broccoli, cooked. There are 60 mg of calcium in 1 cup of cooked broccoli. The items listed above may not be a complete list of recommended foods and beverages. Contact a dietitian for more options. What foods are not recommended? Fruits None, unless they are made with dairy milk or dairy products. Vegetables None, unless they are made with dairy milk or dairy products. Grains Any grains that are made with dairy milk or dairy products. Meats and other proteins None, unless they are made with dairy milk or dairy products. Dairy All dairy products, including milk, goat's milk, buttermilk, kefir, acidophilus milk, flavored milk, evaporated milk, condensed milk, dulce de [employer], eggnog, yogurt, cheese, and cheese spreads. Fats and oils Any that are made with milk or milk products. Margarines and salad dressings that contain milk or cheese. Cream. Half and half. Cream cheese. Sour cream. Chip dips made with sour cream or yogurt. Beverages Hot chocolate. Cocoa with lactose. Instant iced teas. Powdered  fruit drinks. Smoothies made with dairy milk or yogurt. Sweets and desserts Any that are made with milk or milk products. Seasonings and condiments Chewing gum that has lactose. Spice blends if they contain lactose. Artificial sweeteners that contain lactose. Nondairy creamers. The items listed above may not be a complete list of foods and beverages to avoid. Contact a dietitian for more information. Summary  If you are lactose intolerant, it means that you have a hard time digesting lactose, a natural sugar found in milk and milk products.  Following a lactose-free diet can help you manage this condition.  Calcium is important for bone health and is found in many foods that contain lactose. Talk with your health care provider about other sources of calcium. This  information is not intended to replace advice given to you by your health care provider. Make sure you discuss any questions you have with your health care provider. Document Released: 09/28/2001 Document Revised: 05/06/2017 Document Reviewed: 05/06/2017 Elsevier Patient Education  2020 Elsevier Inc.   Thank you,  Dr. Lynann Bologna

## 2019-01-18 NOTE — Progress Notes (Signed)
Chief Complaint: Follow-up  Referring Provider:  Jonathon Jordan, MD      ASSESSMENT AND PLAN;   #1. Sigmoid diverticulitis on CT 01/05/2019  #2. H/O uneventful lap cholewithgood quality IOC d/tbiliary pancreatitis on10/28/2019 (Nl LFTs at that time). Neg post-op CT, MRCP/MRI for leak, biliary dilatation,intra-abdominal fluid collection or pancreatitis.Neg HIDAscan, neg liver biopsy. Had increased WBC count and abn LFTs likely due to ascending cholangitis. No retained CBD stones.  Responded very well to antibiotics. Still the diagnosis was not entirely clear.  She has finished a course of Levaquin. Adm to [facility] 11/13-11/16.  #3. IBS with bloating with occ dirrhea   . H/O lactose intolerance. Neg celiac screen in the past.  Plan:  - Finish antibiotics - Continue probiotics - Proceed with colon in 6 -8 weeks with miralax prep.  Have discussed risks and benefits in detail. - Trial of gluten and lactose free diet in 3 weeks - Can start exercising like cardio work-up.  - FU thereafter. - trial of Hyoscamine 0.125mg  1-2 S/L Q6hrs prn #120.  HPI:    [name] is a 48 y.o. female  For follow-up visit Had sudden onset of LLQ abdominal pain while on walk which got worse.  She came into ED where she was found to have significantly elevated WBC count.  Underwent CT Abdo/pelvis which showed diverticulitis.  Treated with Augmentin.  She does feel better.  Denies having any nausea, vomiting, heartburn, regurgitation odynophagia or dysphagia.  She has intermittent softer bowel movements with abdominal bloating in the past.  Gets more diarrhea during menstrual cycles.  Gets more constipated when she travels.  Diagnosed with IBS in the past.  Colonoscopy report from 2009 was reviewed.   Past Medical History:  Diagnosis Date  . Allergic rhinitis   . Asthma   . Gallstones 1/09   on CT  . IBS (irritable bowel syndrome)   . Ischemic colitis ([place]) 1/09  . Jejunal intussusception  ([place]) 1/09   on CT    Past Surgical History:  Procedure Laterality Date  . CHOLECYSTECTOMY N/A 02/16/2018   Procedure: LAPAROSCOPIC CHOLECYSTECTOMY WITH INTRAOPERATIVE CHOLANGIOGRAM;  Surgeon: Armandina Gemma, MD;  Location: [facility];  Service: General;  Laterality: N/A;  . COLONOSCOPY  1/09    Family History  Problem Relation Age of Onset  . Diabetes Mellitus II Neg Hx   . Hypertension Neg Hx   . Colon cancer Neg Hx     Social History   Tobacco Use  . Smoking status: Never Smoker  . Smokeless tobacco: Never Used  Substance Use Topics  . Alcohol use: Yes    Comment: rare  . Drug use: No    Current Outpatient Medications  Medication Sig Dispense Refill  . calcium-vitamin D (OSCAL WITH D) 500-200 MG-UNIT tablet Take 2 tablets by mouth daily with breakfast.    . cyclobenzaprine (FLEXERIL) 10 MG tablet Take 10 mg by mouth 3 (three) times daily as needed for muscle spasms.     . diclofenac (VOLTAREN) 75 MG EC tablet Take 75 mg by mouth 2 (two) times daily as needed for mild pain.     . fexofenadine (ALLEGRA) 180 MG tablet Take 180 mg by mouth daily.    Marland Kitchen GLUCOSAMINE-CHONDROITIN PO Take 1 tablet by mouth daily.    . montelukast (SINGULAIR) 10 MG tablet Take 10 mg by mouth daily.     . Probiotic Product (ALIGN) 4 MG CAPS Take 1 capsule by mouth daily.    [place] Cutter HFA  108 (90 BASE) MCG/ACT inhaler Inhale 2 puffs into the lungs every 4 (four) hours as needed for wheezing or shortness of breath. Before exercise     Current Facility-Administered Medications  Medication Dose Route Frequency Provider Last Rate Last Dose  . triamcinolone acetonide (KENALOG) 10 MG/ML injection 10 mg  10 mg Other Once Lenn Sink, DPM        No Known Allergies  Review of Systems:  neg     Physical Exam:    BP 116/82   Pulse 70   Temp 98.4 F (36.9 C)   Ht 5\' 5"  (1.651 m)   Wt 139 lb (63 kg)   LMP 12/26/2018   BMI 23.13 kg/m  Filed Weights   01/18/19 1545  Weight: 139 lb (63 kg)    Constitutional:  Well-developed, in no acute distress. Psychiatric: Normal mood and affect. Behavior is normal. HEENT: Pupils normal.  Conjunctivae are normal. No scleral icterus. Cardiovascular: Normal rate, regular rhythm. No edema Pulmonary/chest: Effort normal and breath sounds normal. No wheezing, rales or rhonchi. Abdominal: Soft, nondistended. Nontender. Bowel sounds active throughout. There are no masses palpable. No hepatomegaly. Rectal:  defered Neurological: Alert and oriented to person place and time. Skin: Skin is warm and dry. No rashes noted.  Data Reviewed: I have personally reviewed following labs and imaging studies    Radiology Studies: Ct Abdomen Pelvis W Contrast  Result Date: 01/05/2019 CLINICAL DATA:  Acute left lower quadrant abdominal pain. EXAM: CT ABDOMEN AND PELVIS WITH CONTRAST TECHNIQUE: Multidetector CT imaging of the abdomen and pelvis was performed using the standard protocol following bolus administration of intravenous contrast. CONTRAST:  01/07/2019 OMNIPAQUE IOHEXOL 300 MG/ML  SOLN COMPARISON:  CT scan of March 03, 2018. FINDINGS: Lower chest: No acute abnormality. Hepatobiliary: No focal liver abnormality is seen. Status post cholecystectomy. No biliary dilatation. Pancreas: Unremarkable. No pancreatic ductal dilatation or surrounding inflammatory changes. Spleen: Normal in size without focal abnormality. Adrenals/Urinary Tract: Adrenal glands are unremarkable. Kidneys are normal, without renal calculi, focal lesion, or hydronephrosis. Bladder is unremarkable. Stomach/Bowel: Stomach and appendix are unremarkable. There is no evidence of bowel obstruction. Focal diverticulitis is noted in the proximal sigmoid colon without abscess or perforation. Vascular/Lymphatic: No significant vascular findings are present. No enlarged abdominal or pelvic lymph nodes. Reproductive: Uterus and bilateral adnexa are unremarkable. Other: Small amount of free fluid is noted in the  dependent portion of the pelvis. No hernia is noted. Musculoskeletal: No acute or significant osseous findings. IMPRESSION: Sigmoid diverticulitis is noted without abscess or perforation. Electronically Signed   By: March 05, 2018 M.D.   On: 01/05/2019 10:12  I spent 25 minutes of face-to-face time with the patient. Greater than 50% of the time was spent counseling and coordinating care.  CT was reviewed independently.     01/07/2019, MD 01/18/2019, 3:54 PM  Cc: 01/20/2019, MD

## 2019-01-21 ENCOUNTER — Telehealth: Payer: Self-pay | Admitting: Gastroenterology

## 2019-01-21 DIAGNOSIS — K5732 Diverticulitis of large intestine without perforation or abscess without bleeding: Secondary | ICD-10-CM | POA: Diagnosis not present

## 2019-01-21 NOTE — Telephone Encounter (Signed)
Patient has called back to the office reporting the rash is now all over her body and she is itching all over as well-no fever or SHOB or other side effects as listed with the Hyoscyamine-patient reports she will not take any more of the medication but is going to take a Benadryl for the itching and the rash-patient is concerned about the flush feeling, especially on her face Please advise if additional measures need to be followed and alternative medication of Hyoscyamine that the patient needs to be prescribed

## 2019-01-21 NOTE — Telephone Encounter (Signed)
Pt calling back states she has been very itchy and is not sure what to do. Requesting to speak with the nurse.

## 2019-01-21 NOTE — Telephone Encounter (Signed)
Unusual reaction Could be because of hyoscyamine likely  Plan: -Stop hyoscyamine -Use Benadryl as needed as already suggested. -No other alternatives for now -She is very sensitive to medications  RG

## 2019-01-22 DIAGNOSIS — T7840XA Allergy, unspecified, initial encounter: Secondary | ICD-10-CM | POA: Diagnosis not present

## 2019-01-22 DIAGNOSIS — L309 Dermatitis, unspecified: Secondary | ICD-10-CM | POA: Diagnosis not present

## 2019-01-22 NOTE — Telephone Encounter (Signed)
Called and spoke with patient-patient reports her symptoms have gotten worse and she has called and made an appt with her PCP for today at 4:30pm; patient advised that was an appropriate step since her symptoms have gotten worse; patient advised to call back to the office should further questions/concerns arise; Patient verbalized understanding of information/instructions;

## 2019-03-09 ENCOUNTER — Encounter: Payer: Self-pay | Admitting: Gastroenterology

## 2019-03-23 ENCOUNTER — Other Ambulatory Visit: Payer: Self-pay

## 2019-03-23 ENCOUNTER — Encounter: Payer: Self-pay | Admitting: Gastroenterology

## 2019-03-23 ENCOUNTER — Ambulatory Visit (AMBULATORY_SURGERY_CENTER): Payer: BC Managed Care – PPO | Admitting: Gastroenterology

## 2019-03-23 VITALS — BP 156/87 | HR 65 | Temp 98.4°F | Resp 12 | Ht 65.0 in | Wt 139.0 lb

## 2019-03-23 DIAGNOSIS — Z0389 Encounter for observation for other suspected diseases and conditions ruled out: Secondary | ICD-10-CM | POA: Diagnosis not present

## 2019-03-23 DIAGNOSIS — K5732 Diverticulitis of large intestine without perforation or abscess without bleeding: Secondary | ICD-10-CM | POA: Diagnosis not present

## 2019-03-23 DIAGNOSIS — K640 First degree hemorrhoids: Secondary | ICD-10-CM | POA: Diagnosis not present

## 2019-03-23 MED ORDER — SODIUM CHLORIDE 0.9 % IV SOLN: 500.0000 mL | Freq: Once | INTRAVENOUS | Status: DC

## 2019-03-23 NOTE — Patient Instructions (Signed)
Biopsies taken today. Resume previous diet. Next colonoscopy in 10 years unless problems arise. RETURN TO GI CLINIC IN 12 WEEKS. (A nurse from [place] office will call you to schedule). PLEASE RE-READ ALL PAPERS GIVEN TO YOU TODAY INCLUDING HANDOUTS ON DIVERTICULOSIS AND HEMORRHOIDS.     YOU HAD AN ENDOSCOPIC PROCEDURE TODAY AT [facility]:   Refer to the procedure report that was given to you for any specific questions about what was found during the examination.  If the procedure report does not answer your questions, please call your gastroenterologist to clarify.  If you requested that your care partner not be given the details of your procedure findings, then the procedure report has been included in a sealed envelope for you to review at your convenience later.  YOU SHOULD EXPECT: Some feelings of bloating in the abdomen. Passage of more gas than usual.  Walking can help get rid of the air that was put into your GI tract during the procedure and reduce the bloating. If you had a lower endoscopy (such as a colonoscopy or flexible sigmoidoscopy) you may notice spotting of blood in your stool or on the toilet paper. If you underwent a bowel prep for your procedure, you may not have a normal bowel movement for a few days.  Please Note:  You might notice some irritation and congestion in your nose or some drainage.  This is from the oxygen used during your procedure.  There is no need for concern and it should clear up in a day or so.  SYMPTOMS TO REPORT IMMEDIATELY:   Following lower endoscopy (colonoscopy or flexible sigmoidoscopy):  Excessive amounts of blood in the stool  Significant tenderness or worsening of abdominal pains  Swelling of the abdomen that is new, acute  Fever of 100F or higher   For urgent or emergent issues, a gastroenterologist can be reached at any hour by calling [phone].   DIET:  We do recommend a small meal at first, but then you  may proceed to your regular diet.  Drink plenty of fluids but you should avoid alcoholic beverages for 24 hours.  ACTIVITY:  You should plan to take it easy for the rest of today and you should NOT DRIVE or use heavy machinery until tomorrow (because of the sedation medicines used during the test).    FOLLOW UP: Our staff will call the number listed on your records 48-72 hours following your procedure to check on you and address any questions or concerns that you may have regarding the information given to you following your procedure. If we do not reach you, we will leave a message.  We will attempt to reach you two times.  During this call, we will ask if you have developed any symptoms of COVID 19. If you develop any symptoms (ie: fever, flu-like symptoms, shortness of breath, cough etc.) before then, please call [phone].  If you test positive for Covid 19 in the 2 weeks post procedure, please call and report this information to [place].    If any biopsies were taken you will be contacted by phone or by letter within the next 1-3 weeks.  Please call us at [phone] if you have not heard about the biopsies in 3 weeks.    SIGNATURES/CONFIDENTIALITY: You and/or your care partner have signed paperwork which will be entered into your electronic medical record.  These signatures attest to the fact that that the information above on your After Visit  Summary has been reviewed and is understood.  Full responsibility of the confidentiality of this discharge information lies with you and/or your care-partner. 

## 2019-03-23 NOTE — Progress Notes (Signed)
Temp check by:LC Vital check by:CW  The medical and surgical history was reviewed and verified with the patient. 

## 2019-03-23 NOTE — Progress Notes (Signed)
Report given to PACU, vss 

## 2019-03-23 NOTE — Op Note (Signed)
[facility] Patient Name: [name] Procedure Date: 03/23/2019 8:33 AM MRN: [id number] Endoscopist: Lynann Bologna , MD Age: 48 Referring MD:  Date of Birth: [birth year] Gender: Female Account #: [id number] Procedure:                Colonoscopy Indications:              Clinically significant diarrhea of unexplained                            origin. H/O sigmoid diverticulitis 12/2018. Medicines:                Monitored Anesthesia Care Procedure:                Pre-Anesthesia Assessment:                           - Prior to the procedure, a History and Physical                            was performed, and patient medications and                            allergies were reviewed. The patient's tolerance of                            previous anesthesia was also reviewed. The risks                            and benefits of the procedure and the sedation                            options and risks were discussed with the patient.                            All questions were answered, and informed consent                            was obtained. Prior Anticoagulants: The patient has                            taken no previous anticoagulant or antiplatelet                            agents. ASA Grade Assessment: II - A patient with                            mild systemic disease. After reviewing the risks                            and benefits, the patient was deemed in                            satisfactory condition to undergo the procedure.  After obtaining informed consent, the colonoscope                            was passed under direct vision. Throughout the                            procedure, the patient's blood pressure, pulse, and                            oxygen saturations were monitored continuously. The                            Colonoscope was introduced through the anus and                            advanced to the 2 cm  into the ileum. The                            colonoscopy was performed without difficulty. The                            patient tolerated the procedure well. The quality                            of the bowel preparation was good. The terminal                            ileum, ileocecal valve, appendiceal orifice, and                            rectum were photographed. Scope In: 8:43:32 AM Scope Out: 8:58:53 AM Scope Withdrawal Time: 0 hours 10 minutes 49 seconds  Total Procedure Duration: 0 hours 15 minutes 21 seconds  Findings:                 The colon (entire examined portion) appeared                            normal. Biopsies for histology were taken with a                            cold forceps from the entire colon for evaluation                            of microscopic colitis.                           Multiple small-mouthed diverticula were found in                            the sigmoid colon, few scattered descending colon,                            transverse colon and ascending colon.  Non-bleeding internal hemorrhoids were found during                            retroflexion. The hemorrhoids were small.                           The terminal ileum appeared normal.                           The exam was otherwise without abnormality on                            direct and retroflexion views. Complications:            No immediate complications. Estimated Blood Loss:     Estimated blood loss: none. Impression:               -Pancolonic diverticulosis predominantly in the                            sigmoid colon.                           -Small internal hemorrhoids.                           -Otherwise normal colonoscopy to TI. Recommendation:           - Patient has a contact number available for                            emergencies. The signs and symptoms of potential                            delayed complications were discussed  with the                            patient. Return to normal activities tomorrow.                            Written discharge instructions were provided to the                            patient.                           - Resume previous diet.                           - Continue present medications.                           - Await pathology results.                           - Repeat colonoscopy in 10 years for screening  purposes. Earlier if with any new problems or if                            there is any change in family history.                           - Return to GI clinic in 12 weeks. Lynann Bologna, MD 03/23/2019 9:10:56 AM This report has been signed electronically.

## 2019-03-25 ENCOUNTER — Encounter: Payer: Self-pay | Admitting: Gastroenterology

## 2019-03-26 ENCOUNTER — Telehealth: Payer: Self-pay

## 2019-03-26 NOTE — Telephone Encounter (Signed)
  Follow up Call-  Call back number 03/23/2019  Post procedure Call Back phone  # [phone]  Permission to leave phone message Yes  Some recent data might be hidden     Patient questions:  Do you have a fever, pain , or abdominal swelling? No. Pain Score  0 *  Have you tolerated food without any problems? Yes.    Have you been able to return to your normal activities? Yes.    Do you have any questions about your discharge instructions: Diet   No. Medications  No. Follow up visit  No.  Do you have questions or concerns about your Care? No.  Actions: * If pain score is 4 or above: No action needed, pain <4.  1. Have you developed a fever since your procedure? no  2.   Have you had an respiratory symptoms (SOB or cough) since your procedure? no  3.   Have you tested positive for COVID 19 since your procedure no  4.   Have you had any family members/close contacts diagnosed with the COVID 19 since your procedure?  no   If yes to any of these questions please route to Joylene John, RN and Alphonsa Gin, Therapist, sports.

## 2019-04-20 DIAGNOSIS — Z713 Dietary counseling and surveillance: Secondary | ICD-10-CM | POA: Diagnosis not present

## 2019-05-24 DIAGNOSIS — Z713 Dietary counseling and surveillance: Secondary | ICD-10-CM | POA: Diagnosis not present

## 2019-08-06 ENCOUNTER — Telehealth: Payer: Self-pay | Admitting: Gastroenterology

## 2019-08-06 NOTE — Telephone Encounter (Signed)
Pt reported that she is having a diverticulitis flare.  Please advise.  

## 2019-08-06 NOTE — Telephone Encounter (Signed)
Called and spoke with patient- patient reports she thinks she is having a "diverticulitis flare up" -she reports she has been following a LOW FODMAP diet but is still having sensitivity to certain foods, especially gluten-patient reports she thinks she has "been having a flare at least once a month since the colonoscopy in Dec 2020";  This "flare" started a few days ago and it started suddenly- Patient denies nausea/vomiting/rectal bleeding/rectal pain Patient complains of very dark stools/diarrhea/bloating/mucus in stool/lower abd pain/abd pain lessens after having a bowel movement;  Patient has been scheduled an appt with Colleen at the [facility] office on 08/11/2019 at 2:00 pm per her request;   Please advise of further instructions

## 2019-08-11 ENCOUNTER — Other Ambulatory Visit (INDEPENDENT_AMBULATORY_CARE_PROVIDER_SITE_OTHER): Payer: BC Managed Care – PPO

## 2019-08-11 ENCOUNTER — Encounter: Payer: Self-pay | Admitting: Nurse Practitioner

## 2019-08-11 ENCOUNTER — Ambulatory Visit: Payer: BC Managed Care – PPO | Admitting: Nurse Practitioner

## 2019-08-11 VITALS — BP 130/84 | HR 72 | Temp 98.9°F | Ht 65.0 in | Wt 137.0 lb

## 2019-08-11 DIAGNOSIS — K5732 Diverticulitis of large intestine without perforation or abscess without bleeding: Secondary | ICD-10-CM

## 2019-08-11 DIAGNOSIS — R1032 Left lower quadrant pain: Secondary | ICD-10-CM

## 2019-08-11 LAB — C-REACTIVE PROTEIN: CRP: <1 mg/dL (ref 0.5–20.0)

## 2019-08-11 MED ORDER — DICYCLOMINE HCL 10 MG PO CAPS: 10.0000 mg | ORAL_CAPSULE | Freq: Three times a day (TID) | ORAL | 0 refills | Status: AC | PRN

## 2019-08-11 NOTE — Patient Instructions (Signed)
If you are age 47 or older, your body mass index should be between 23-30. Your Body mass index is 22.8 kg/m. If this is out of the aforementioned range listed, please consider follow up with your Primary Care Provider.  If you are age 33 or younger, your body mass index should be between 19-25. Your Body mass index is 22.8 kg/m. If this is out of the aformentioned range listed, please consider follow up with your Primary Care Provider  You have been scheduled for a CT scan of the abdomen and pelvis at  [facility]. Please go to radiology and pick up the oral contrast for your CT scan at least 1 day prior to your scheduled procedure.  You are scheduled on 08/13/2019 at 2:45 pm. You should arrive 15 minutes prior to your appointment time for registration. Please follow the written instructions below on the day of your exam:  WARNING: IF YOU ARE ALLERGIC TO IODINE/X-RAY DYE, PLEASE NOTIFY RADIOLOGY IMMEDIATELY AT [phone]! YOU WILL BE GIVEN A 13 HOUR PREMEDICATION PREP.  1) Do not eat or drink anything after 9:45 am (4 hours prior to your test) 2) You have been given 2 bottles of oral contrast to drink. The solution may taste better if refrigerated, but do NOT add ice or any other liquid to this solution. Shake well before drinking.    Drink 1 bottle of contrast @ 12:45 pm (2 hours prior to your exam)  Drink 1 bottle of contrast @ 1:45pm (1 hour prior to your exam)  You may take any medications as prescribed with a small amount of water, if necessary. If you take any of the following medications: METFORMIN, GLUCOPHAGE, GLUCOVANCE, AVANDAMET, RIOMET, FORTAMET, [place] MET, JANUMET, GLUMETZA or METAGLIP, you MAY be asked to HOLD this medication 48 hours AFTER the exam.  The purpose of you drinking the oral contrast is to aid in the visualization of your intestinal tract. The contrast solution may cause some diarrhea. Depending on your individual set of symptoms, you may also receive an  intravenous injection of x-ray contrast/dye. Plan on being at [facility] for 30 minutes or longer, depending on the type of exam you are having performed.  This test typically takes 30-45 minutes to complete.  If you have any questions regarding your exam or if you need to reschedule, you may call the CT department at [phone] between the hours of 8:00 am and 5:00 pm, Monday-Friday.  ________________________________________________________________________ Your provider has requested that you go to the basement level for lab work before leaving today. Press "B" on the elevator. The lab is located at the first door on the left as you exit the elevator.  Thank you for choosing [facility] Noralyn Pick, CRNP

## 2019-08-11 NOTE — Progress Notes (Signed)
08/11/2019 [name] [id number] 05/14/1970   Chief Complaint: LLQ abdominal pain   History of Present Illness: [name] has a past medical history of diverticulitis. She presents today for further evaluation for recurrent LLQ abdominal pain. Her last documented episode of diverticulitis was 01/05/2019. At that time, an abdominal/pelvic CT scan done in the ED confirmed sigmoid diverticulitis without abscess or perforation. WBC 19.7. She was prescribed Augmentin 875mg  po tid for 7 or 10 days and her abdominal pain resolved. She was seen in the office for follow up with Dr. Lyndel Safe on 01/18/2019. She was prescribed a gluten and lactose free diet, a trial of Hyoscyamine and a colonoscopy was ordered. She developed hives on Hyoscyamine so she stopped it. She underwent a colonoscopy on 03/23/2019 which showed pandiverticulosis predominantly in the sigmoid colon and small internal hemorrhoids. She was advised to repeat a colonoscopy in 10 years. She reported having a "flare" of diverticulitis once monthly for the past 4 months. Jan. 2021 she had LLQ pain, felt like a muscle pull, low level of discomfort for 2 to 3 days then abrupt worsening of pain for 3 to 4 days then low level pain for 2 days then gone. She had similar episodes in Feb and March. Her most recent episode of LLQ pain started on April 08/02/2019 which was a low level of discomfort for 3 days. On Thurs 4/15 she ate dinner, drank a beer and ate cake. Her LLQ pain worsened for the next 2 days then resolved by Sunday 4/18 evening. No fever. Yesterday, she felt mild LLQ discomfort when walking. Today, she feels well. No noticeable abdominal pain. She is passing normal brown BMs daily. She does not feel constipated. No straining. She has occasional soft to loose stools which has occurred intermittently since having her gallbladder surgery done due to having gallstone pancreatitis on 02/16/2018. She developed post CCY epigastric pain with  elevated LFTs, no evidence of biliary leak per HIDA and MRCP was negative. She was treated for possible ascending cholangitis. She underwent a liver biopsy 03/07/2019 which showed liver parenchyma with mild reactive/regeneratie changes. No fibrosis. Her LFTs normalized.     Current Outpatient Medications on File Prior to Visit  Medication Sig Dispense Refill  . AUROVELA 24 FE 1-20 MG-MCG(24) tablet Take 1 tablet by mouth daily.    . calcium-vitamin D (OSCAL WITH D) 500-200 MG-UNIT tablet Take 2 tablets by mouth daily with breakfast.    . cyclobenzaprine (FLEXERIL) 10 MG tablet Take 10 mg by mouth 3 (three) times daily as needed for muscle spasms.     . diclofenac (VOLTAREN) 75 MG EC tablet Take 75 mg by mouth 2 (two) times daily as needed for mild pain.     . fexofenadine (ALLEGRA) 180 MG tablet Take 180 mg by mouth daily.    Marland Kitchen GLUCOSAMINE-CHONDROITIN PO Take 1 tablet by mouth daily.    . montelukast (SINGULAIR) 10 MG tablet Take 10 mg by mouth daily.     . Probiotic Product (ALIGN) 4 MG CAPS Take 1 capsule by mouth daily.    . VENTOLIN HFA 108 (90 BASE) MCG/ACT inhaler Inhale 2 puffs into the lungs every 4 (four) hours as needed for wheezing or shortness of breath. Before exercise     Current Facility-Administered Medications on File Prior to Visit  Medication Dose Route Frequency Provider Last Rate Last Admin  . triamcinolone acetonide (KENALOG) 10 MG/ML injection 10 mg  10 mg Other Once Wallene Huh, DPM  Allergies  Allergen Reactions  . Hyoscyamine Hives      rent Medications, Allergies, Past Medical History, Past Surgical History, Family History and Social History were reviewed in [employer] record.   Physical Exam: BP 130/84   Pulse 72   Temp 98.9 F (37.2 C)   Ht 5\' 5"  (1.651 m)   Wt 137 lb (62.1 kg)   LMP 08/07/2019 (Exact Date)   BMI 22.80 kg/m  General: Well developed 49 year old female in no acute distress. Head: Normocephalic and  atraumatic. Eyes: No scleral icterus. Conjunctiva pink . Ears: Normal auditory acuity. Mouth: Dentition intact. No ulcers or lesions.  Lungs: Clear throughout to auscultation. Heart: Regular rate and rhythm, no murmur. Abdomen: Soft, nondistended. LLQ tenderness without rebound or guarding. No masses or hepatomegaly. Normal bowel sounds x 4 quadrants.  Rectal: Deferred.  Musculoskeletal: Symmetrical with no gross deformities. Extremities: No edema. Neurological: Alert oriented x 4. No focal deficits.  Psychological: Alert and cooperative. Normal mood and affect  Assessment and Recommendations:  1. Recurrent sigmoid diverticulitis, suspect smoldering diverticulitis with monthly flares of LLQ pain. Surgical intervention would be challenging in setting of pandiverticulosis. Colonoscopy 03/2019 without evidence of colitis or malignancy.  -CBC, CMP and CRP -CTAP with oral/IV contrast -Further recommendations to be determined after labs and CTAP results received  -Dicyclomine 10mg  one tab po Q 8 hrs PRN -Avoid constipation discussed  -To the ED if severe abdominal pain worsens  -Patient to call our office if her abdominal pain recurs/worsens, I discussed the risk of colon perforation if diverticulitis not treated promptly   2. IBS -See plan in # 1

## 2019-08-12 ENCOUNTER — Telehealth: Payer: Self-pay | Admitting: General Surgery

## 2019-08-12 LAB — CBC WITH DIFFERENTIAL/PLATELET
Basophils Absolute: 0.1 10*3/uL (ref 0.0–0.1)
Basophils Relative: 0.9 % (ref 0.0–3.0)
Eosinophils Absolute: 0.1 10*3/uL (ref 0.0–0.7)
Eosinophils Relative: 1.4 % (ref 0.0–5.0)
HCT: 40.2 % (ref 36.0–46.0)
Hemoglobin: 13.4 g/dL (ref 12.0–15.0)
Lymphocytes Relative: 25.6 % (ref 12.0–46.0)
Lymphs Abs: 2.6 10*3/uL (ref 0.7–4.0)
MCHC: 33.4 g/dL (ref 30.0–36.0)
MCV: 89.3 fl (ref 78.0–100.0)
Monocytes Absolute: 0.6 10*3/uL (ref 0.1–1.0)
Monocytes Relative: 5.6 % (ref 3.0–12.0)
Neutro Abs: 6.7 10*3/uL (ref 1.4–7.7)
Neutrophils Relative %: 66.5 % (ref 43.0–77.0)
Platelets: 336 10*3/uL (ref 150.0–400.0)
RBC: 4.5 Mil/uL (ref 3.87–5.11)
RDW: 12.9 % (ref 11.5–15.5)
WBC: 10.1 10*3/uL (ref 4.0–10.5)

## 2019-08-12 NOTE — Telephone Encounter (Signed)
Contact central scheduling spoke with Lyla Son gave her the auth # for the patients CT.auth [id number]  Auth # [id number]

## 2019-08-13 ENCOUNTER — Other Ambulatory Visit: Payer: Self-pay

## 2019-08-13 ENCOUNTER — Ambulatory Visit (HOSPITAL_COMMUNITY)
Admission: RE | Admit: 2019-08-13 | Discharge: 2019-08-13 | Disposition: A | Discharge status: Home / Self Care | Payer: BC Managed Care – PPO | Source: Ambulatory Visit | Attending: Nurse Practitioner | Admitting: Nurse Practitioner

## 2019-08-13 DIAGNOSIS — R1032 Left lower quadrant pain: Secondary | ICD-10-CM

## 2019-08-13 DIAGNOSIS — K5730 Diverticulosis of large intestine without perforation or abscess without bleeding: Secondary | ICD-10-CM | POA: Diagnosis not present

## 2019-08-13 DIAGNOSIS — K5732 Diverticulitis of large intestine without perforation or abscess without bleeding: Secondary | ICD-10-CM | POA: Insufficient documentation

## 2019-08-13 MED ORDER — IOHEXOL 300 MG/ML  SOLN
100.0000 mL | Freq: Once | INTRAMUSCULAR | Status: AC | PRN
  Administered 2019-08-13: 100 mL via INTRAVENOUS

## 2019-08-13 MED ORDER — SODIUM CHLORIDE (PF) 0.9 % IJ SOLN
INTRAMUSCULAR | Status: AC
  Filled 2019-08-13: qty 50

## 2019-08-17 ENCOUNTER — Other Ambulatory Visit: Payer: Self-pay | Admitting: Nurse Practitioner

## 2019-08-17 ENCOUNTER — Telehealth: Payer: Self-pay | Admitting: Nurse Practitioner

## 2019-08-17 MED ORDER — AMOXICILLIN-POT CLAVULANATE 875-125 MG PO TABS: 1.0000 | ORAL_TABLET | Freq: Two times a day (BID) | ORAL | 0 refills | Status: AC

## 2019-08-17 NOTE — Telephone Encounter (Signed)
I called the patient left a detailed message on her mobile phone, Dr. Chales Abrahams has reviewed her abdominal/pelvic CT and he recommended she take Augmentin 875 mg 1 p.o. twice daily for 7 days.  A prescription for the Augmentin was sent to her pharmacy.  I asked the patient to call me in 1 week with an update and to schedule follow-up appointment in the office with Dr. Chales Abrahams in the next month.

## 2019-09-01 ENCOUNTER — Telehealth: Payer: Self-pay

## 2019-09-01 ENCOUNTER — Other Ambulatory Visit: Payer: Self-pay

## 2019-09-01 ENCOUNTER — Other Ambulatory Visit (INDEPENDENT_AMBULATORY_CARE_PROVIDER_SITE_OTHER): Payer: BC Managed Care – PPO

## 2019-09-01 DIAGNOSIS — R197 Diarrhea, unspecified: Secondary | ICD-10-CM

## 2019-09-01 DIAGNOSIS — R1032 Left lower quadrant pain: Secondary | ICD-10-CM

## 2019-09-01 LAB — CBC WITH DIFFERENTIAL/PLATELET
Basophils Absolute: 0.1 10*3/uL (ref 0.0–0.1)
Basophils Relative: 0.9 % (ref 0.0–3.0)
Eosinophils Absolute: 0.1 10*3/uL (ref 0.0–0.7)
Eosinophils Relative: 0.8 % (ref 0.0–5.0)
HCT: 37 % (ref 36.0–46.0)
Hemoglobin: 12.5 g/dL (ref 12.0–15.0)
Lymphocytes Relative: 15.2 % (ref 12.0–46.0)
Lymphs Abs: 1.9 10*3/uL (ref 0.7–4.0)
MCHC: 33.7 g/dL (ref 30.0–36.0)
MCV: 89 fl (ref 78.0–100.0)
Monocytes Absolute: 0.9 10*3/uL (ref 0.1–1.0)
Monocytes Relative: 6.8 % (ref 3.0–12.0)
Neutro Abs: 9.8 10*3/uL — ABNORMAL HIGH (ref 1.4–7.7)
Neutrophils Relative %: 76.3 % (ref 43.0–77.0)
Platelets: 269 10*3/uL (ref 150.0–400.0)
RBC: 4.16 Mil/uL (ref 3.87–5.11)
RDW: 13.1 % (ref 11.5–15.5)
WBC: 12.9 10*3/uL — ABNORMAL HIGH (ref 4.0–10.5)

## 2019-09-01 LAB — COMPREHENSIVE METABOLIC PANEL
ALT: 10 U/L (ref 0–35)
AST: 13 U/L (ref 0–37)
Albumin: 3.9 g/dL (ref 3.5–5.2)
Alkaline Phosphatase: 51 U/L (ref 39–117)
BUN: 17 mg/dL (ref 6–23)
CO2: 27 mEq/L (ref 19–32)
Calcium: 8.7 mg/dL (ref 8.4–10.5)
Chloride: 103 mEq/L (ref 96–112)
Creatinine, Ser: 0.82 mg/dL (ref 0.40–1.20)
GFR: 74.62 mL/min (ref >60.00)
Glucose, Bld: 137 mg/dL — ABNORMAL HIGH (ref 70–99)
Potassium: 4 mEq/L (ref 3.5–5.1)
Sodium: 138 mEq/L (ref 135–145)
Total Bilirubin: 0.6 mg/dL (ref 0.2–1.2)
Total Protein: 6.7 g/dL (ref 6.0–8.3)

## 2019-09-01 LAB — C-REACTIVE PROTEIN: CRP: 7.4 mg/dL (ref 0.5–20.0)

## 2019-09-01 MED ORDER — AMOXICILLIN-POT CLAVULANATE 875-125 MG PO TABS: 1.0000 | ORAL_TABLET | Freq: Two times a day (BID) | ORAL | 0 refills | Status: AC

## 2019-09-01 NOTE — Telephone Encounter (Signed)
See telephone encounter of 09/01/19

## 2019-09-01 NOTE — Telephone Encounter (Signed)
Lab order placed. Patient notified. Pharmacy confirmed. Rx to be sent now.

## 2019-09-01 NOTE — Telephone Encounter (Signed)
Pt reported that she has completed antibiotics but is having another diverticulitis flare that started yesterday.  She is scheduled for an OV with Dr. Chales Abrahams 09/24/19.

## 2019-09-01 NOTE — Telephone Encounter (Signed)
Spoke with the patient.  Patient treated with Augmentin 2 weeks ago for diverticulitis. She calls today with complaints of a return of the pain in her LLQ. She feels bloated. She has diarrhea. Last night she felt chills. This has ceased. Dicyclomine helps with her spasms "but the pain remains." States these are the symptoms she experiences with diverticulitis. Please advise.

## 2019-09-01 NOTE — Telephone Encounter (Signed)
[name], I called the patient, she stated her current sx including bloat, LLQ pain and diarrhea are her typical diverticulitis sx.  I explained to her that diarrhea is not a typical sx for diverticulitis. Pls enter a lab order for C. Diff PCR  and for a CBC with diff, CMP and CRP. Pt to go to the lab today. Send RX for Augmentin 875mg  1 po bid x 14 days # 28, no refills. Pt to take Florator probiotic 1 po bid x 2 to 4 weeks. Pt is aware if her diarrhea worsens to call me. She does not typically develop diarrhea when Augmentin used in the past. If her abd pain the same or worse tomorrow I will order a repeat CT abd/pelvis. She will go to the ED if she develops severe abd pain. Pls call patient when lab order is done. thx

## 2019-09-02 ENCOUNTER — Other Ambulatory Visit: Payer: BC Managed Care – PPO

## 2019-09-02 DIAGNOSIS — Z1231 Encounter for screening mammogram for malignant neoplasm of breast: Secondary | ICD-10-CM | POA: Diagnosis not present

## 2019-09-02 DIAGNOSIS — Z01419 Encounter for gynecological examination (general) (routine) without abnormal findings: Secondary | ICD-10-CM | POA: Diagnosis not present

## 2019-09-02 DIAGNOSIS — Z6823 Body mass index (BMI) 23.0-23.9, adult: Secondary | ICD-10-CM | POA: Diagnosis not present

## 2019-09-04 LAB — CLOSTRIDIUM DIFFICILE BY PCR: Toxigenic C. Difficile by PCR: NEGATIVE

## 2019-09-13 ENCOUNTER — Encounter (HOSPITAL_BASED_OUTPATIENT_CLINIC_OR_DEPARTMENT_OTHER): Payer: Self-pay

## 2019-09-13 ENCOUNTER — Emergency Department (HOSPITAL_BASED_OUTPATIENT_CLINIC_OR_DEPARTMENT_OTHER)
Admission: EM | Admit: 2019-09-13 | Discharge: 2019-09-13 | Disposition: A | Discharge status: Home / Self Care | Payer: BC Managed Care – PPO | Attending: Emergency Medicine | Admitting: Emergency Medicine

## 2019-09-13 ENCOUNTER — Telehealth: Payer: Self-pay | Admitting: Nurse Practitioner

## 2019-09-13 ENCOUNTER — Emergency Department (HOSPITAL_BASED_OUTPATIENT_CLINIC_OR_DEPARTMENT_OTHER): Payer: BC Managed Care – PPO

## 2019-09-13 ENCOUNTER — Other Ambulatory Visit: Payer: Self-pay

## 2019-09-13 DIAGNOSIS — K5732 Diverticulitis of large intestine without perforation or abscess without bleeding: Secondary | ICD-10-CM | POA: Diagnosis not present

## 2019-09-13 DIAGNOSIS — R1110 Vomiting, unspecified: Secondary | ICD-10-CM | POA: Diagnosis not present

## 2019-09-13 DIAGNOSIS — R1030 Lower abdominal pain, unspecified: Secondary | ICD-10-CM | POA: Diagnosis not present

## 2019-09-13 DIAGNOSIS — R109 Unspecified abdominal pain: Secondary | ICD-10-CM | POA: Diagnosis not present

## 2019-09-13 DIAGNOSIS — Z888 Allergy status to other drugs, medicaments and biological substances status: Secondary | ICD-10-CM | POA: Insufficient documentation

## 2019-09-13 DIAGNOSIS — J45909 Unspecified asthma, uncomplicated: Secondary | ICD-10-CM | POA: Insufficient documentation

## 2019-09-13 DIAGNOSIS — K5792 Diverticulitis of intestine, part unspecified, without perforation or abscess without bleeding: Secondary | ICD-10-CM

## 2019-09-13 DIAGNOSIS — Z79899 Other long term (current) drug therapy: Secondary | ICD-10-CM | POA: Insufficient documentation

## 2019-09-13 LAB — URINALYSIS, ROUTINE W REFLEX MICROSCOPIC
Bilirubin Urine: NEGATIVE
Glucose, UA: NEGATIVE mg/dL
Ketones, ur: 15 mg/dL — AB
Leukocytes,Ua: NEGATIVE
Nitrite: NEGATIVE
Protein, ur: NEGATIVE mg/dL
Specific Gravity, Urine: 1.01 (ref 1.005–1.030)
pH: 6 (ref 5.0–8.0)

## 2019-09-13 LAB — CBC WITH DIFFERENTIAL/PLATELET
Abs Immature Granulocytes: 0.12 10*3/uL — ABNORMAL HIGH (ref 0.00–0.07)
Basophils Absolute: 0.1 10*3/uL (ref 0.0–0.1)
Basophils Relative: 0 %
Eosinophils Absolute: 0 10*3/uL (ref 0.0–0.5)
Eosinophils Relative: 0 %
HCT: 35.8 % — ABNORMAL LOW (ref 36.0–46.0)
Hemoglobin: 12.1 g/dL (ref 12.0–15.0)
Immature Granulocytes: 1 %
Lymphocytes Relative: 5 %
Lymphs Abs: 1.2 10*3/uL (ref 0.7–4.0)
MCH: 30.1 pg (ref 26.0–34.0)
MCHC: 33.8 g/dL (ref 30.0–36.0)
MCV: 89.1 fL (ref 80.0–100.0)
Monocytes Absolute: 0.8 10*3/uL (ref 0.1–1.0)
Monocytes Relative: 3 %
Neutro Abs: 19.8 10*3/uL — ABNORMAL HIGH (ref 1.7–7.7)
Neutrophils Relative %: 91 %
Platelets: 264 10*3/uL (ref 150–400)
RBC: 4.02 MIL/uL (ref 3.87–5.11)
RDW: 12.8 % (ref 11.5–15.5)
WBC: 21.9 10*3/uL — ABNORMAL HIGH (ref 4.0–10.5)
nRBC: 0 % (ref 0.0–0.2)

## 2019-09-13 LAB — URINALYSIS, MICROSCOPIC (REFLEX)

## 2019-09-13 LAB — COMPREHENSIVE METABOLIC PANEL
ALT: 14 U/L (ref 0–44)
AST: 19 U/L (ref 15–41)
Albumin: 3.5 g/dL (ref 3.5–5.0)
Alkaline Phosphatase: 63 U/L (ref 38–126)
Anion gap: 13 (ref 5–15)
BUN: 11 mg/dL (ref 6–20)
CO2: 20 mmol/L — ABNORMAL LOW (ref 22–32)
Calcium: 8.3 mg/dL — ABNORMAL LOW (ref 8.9–10.3)
Chloride: 102 mmol/L (ref 98–111)
Creatinine, Ser: 0.76 mg/dL (ref 0.44–1.00)
GFR calc Af Amer: >60 mL/min (ref >60)
GFR calc non Af Amer: >60 mL/min (ref >60)
Glucose, Bld: 102 mg/dL — ABNORMAL HIGH (ref 70–99)
Potassium: 3.7 mmol/L (ref 3.5–5.1)
Sodium: 135 mmol/L (ref 135–145)
Total Bilirubin: 1.1 mg/dL (ref 0.3–1.2)
Total Protein: 7.1 g/dL (ref 6.5–8.1)

## 2019-09-13 LAB — LIPASE, BLOOD: Lipase: 18 U/L (ref 11–51)

## 2019-09-13 MED ORDER — OXYCODONE-ACETAMINOPHEN 5-325 MG PO TABS
1.0000 | ORAL_TABLET | Freq: Once | ORAL | Status: AC
  Administered 2019-09-13: 1 via ORAL
  Filled 2019-09-13: qty 1

## 2019-09-13 MED ORDER — CIPROFLOXACIN HCL 500 MG PO TABS
500.0000 mg | ORAL_TABLET | Freq: Once | ORAL | Status: AC
  Administered 2019-09-13: 500 mg via ORAL
  Filled 2019-09-13: qty 1

## 2019-09-13 MED ORDER — METRONIDAZOLE 500 MG PO TABS
500.0000 mg | ORAL_TABLET | Freq: Once | ORAL | Status: AC
  Administered 2019-09-13: 500 mg via ORAL
  Filled 2019-09-13: qty 1

## 2019-09-13 MED ORDER — HYDROCODONE-ACETAMINOPHEN 5-325 MG PO TABS: 2.0000 | ORAL_TABLET | ORAL | 0 refills | Status: AC | PRN

## 2019-09-13 MED ORDER — KETOROLAC TROMETHAMINE 15 MG/ML IJ SOLN
15.0000 mg | Freq: Once | INTRAMUSCULAR | Status: AC
  Administered 2019-09-13: 15 mg via INTRAVENOUS
  Filled 2019-09-13: qty 1

## 2019-09-13 MED ORDER — CIPROFLOXACIN HCL 500 MG PO TABS
500.0000 mg | ORAL_TABLET | Freq: Two times a day (BID) | ORAL | 0 refills | Status: DC
Start: 2019-09-13 — End: 2019-09-24

## 2019-09-13 MED ORDER — ONDANSETRON 4 MG PO TBDP
4.0000 mg | ORAL_TABLET | Freq: Three times a day (TID) | ORAL | 0 refills | Status: DC | PRN
Start: 2019-09-13 — End: 2019-09-13

## 2019-09-13 MED ORDER — ONDANSETRON HCL 4 MG/2ML IJ SOLN
4.0000 mg | Freq: Once | INTRAMUSCULAR | Status: AC
  Administered 2019-09-13: 4 mg via INTRAVENOUS
  Filled 2019-09-13: qty 2

## 2019-09-13 MED ORDER — METRONIDAZOLE 500 MG PO TABS
500.0000 mg | ORAL_TABLET | Freq: Three times a day (TID) | ORAL | 0 refills | Status: DC
Start: 2019-09-13 — End: 2019-09-24

## 2019-09-13 MED ORDER — SODIUM CHLORIDE 0.9 % IV BOLUS
1000.0000 mL | Freq: Once | INTRAVENOUS | Status: AC
  Administered 2019-09-13: 1000 mL via INTRAVENOUS

## 2019-09-13 MED ORDER — ONDANSETRON 4 MG PO TBDP
4.0000 mg | ORAL_TABLET | Freq: Three times a day (TID) | ORAL | 0 refills | Status: DC | PRN
Start: 2019-09-13 — End: 2019-09-24

## 2019-09-13 MED ORDER — IOHEXOL 300 MG/ML  SOLN
100.0000 mL | Freq: Once | INTRAMUSCULAR | Status: AC | PRN
  Administered 2019-09-13: 100 mL via INTRAVENOUS

## 2019-09-13 MED FILL — CIPROFLOXACIN HCL 500 MG TA: 500 | 7 days supply | Qty: 14 | Fill #0

## 2019-09-13 MED FILL — ONDANSETRON ODT 4MG TBDP: 4 | 3 days supply | Qty: 8 | Fill #0

## 2019-09-13 MED FILL — METRONIDAZOLE 500 MG TABS: 500 | 7 days supply | Qty: 21 | Fill #0

## 2019-09-13 MED FILL — HYDROCODON-APAP 5-325: 5-325 | 2 days supply | Qty: 10 | Fill #0

## 2019-09-13 NOTE — ED Provider Notes (Signed)
[facility] EMERGENCY DEPARTMENT Provider Note   CSN: [id number] Arrival date & time: 09/13/19  1123     History Chief Complaint  Patient presents with  . Abdominal Pain    [name] is a 49 y.o. female.  HPI 49 year old female past medical history significant for diverticulitis presents to the emergency department today for evaluation of abdominal pain.  Patient reports for the past 2 weeks she is been dealing with some vague lower abdominal pain that she thought was her diverticulitis flare.  She reports that since September 2020 she has had monthly flares of diverticulitis.  Patient reports that she did have a colonoscopy in December.  Did have known diverticulosis at that time.  Patient reports that she was placed on Augmentin last week.  She is taking a 2 weeks course.  She reports the pain has not improved.  She reports the pain is worsening to her lower abdomen.  Rates the pain a 10/10.  Patient reports she did have a fever of 100.5 orally last night.  Patient reports vomiting as well.  She reports multiple episodes of nonbloody diarrhea.  No urinary symptoms.  She has been taking Bentyl for the pain without any significant relief.  Patient states that she is scheduled to see her GI doctor next week.  Patient reports the pain is crampy and sharp at times.  Pain is nonradiating.  Denies any vaginal bleeding.    Past Medical History:  Diagnosis Date  . Allergic rhinitis   . Asthma   . Diverticulitis 2020  . Gallstones 1/09   on CT  . IBS (irritable bowel syndrome)   . Ischemic colitis (HCC) 1/09  . Jejunal intussusception (HCC) 1/09   on CT    Patient Active Problem List   Diagnosis Date Noted  . Epigastric pain 03/05/2018  . Elevated LFTs 03/04/2018  . Epigastric abdominal pain 03/04/2018  . Abdominal pain 03/03/2018  . Gallstone pancreatitis 02/13/2018  . Gallstones 02/13/2018  . Acute pancreatitis 02/13/2018  . Lactose intolerance 07/10/2013  .  Irritable bowel syndrome 06/25/2007  . ALLERGIC RHINITIS CAUSE UNSPECIFIED 05/13/2007  . Intussusception (HCC) 05/13/2007  . CALCU GALLBLADD W/ACUT CHOLCYST W/O MENTION OBST 05/13/2007  . FLATULENCE ERUCTATION AND GAS PAIN 05/13/2007    Past Surgical History:  Procedure Laterality Date  . CHOLECYSTECTOMY N/A 02/16/2018   Procedure: LAPAROSCOPIC CHOLECYSTECTOMY WITH INTRAOPERATIVE CHOLANGIOGRAM;  Surgeon: Darnell Level, MD;  Location: [facility] ORS;  Service: General;  Laterality: N/A;  . COLONOSCOPY  1/09     OB History   No obstetric history on file.     Family History  Problem Relation Age of Onset  . Diabetes Mellitus II Neg Hx   . Hypertension Neg Hx   . Colon cancer Neg Hx   . Esophageal cancer Neg Hx   . Rectal cancer Neg Hx   . Stomach cancer Neg Hx     Social History   Tobacco Use  . Smoking status: Never Smoker  . Smokeless tobacco: Never Used  Substance Use Topics  . Alcohol use: Yes    Comment: saturday  . Drug use: No    Home Medications Prior to Admission medications   Medication Sig Start Date End Date Taking? Authorizing Provider  amoxicillin-clavulanate (AUGMENTIN) 875-125 MG tablet Take 1 tablet by mouth 2 (two) times daily for 14 days. 09/01/19 09/15/19  Arnaldo Natal, NP  AUROVELA 24 FE 1-20 MG-MCG(24) tablet Take 1 tablet by mouth daily. 08/02/19   [provider]  calcium-vitamin D (OSCAL WITH D) 500-200 MG-UNIT tablet Take 2 tablets by mouth daily with breakfast.    [provider]  cyclobenzaprine (FLEXERIL) 10 MG tablet Take 10 mg by mouth 3 (three) times daily as needed for muscle spasms.  04/01/13   [provider]  diclofenac (VOLTAREN) 75 MG EC tablet Take 75 mg by mouth 2 (two) times daily as needed for mild pain.     [provider]  dicyclomine (BENTYL) 10 MG capsule Take 1 capsule (10 mg total) by mouth every 8 (eight) hours as needed for spasms. 08/11/19   Noralyn Pick, NP  fexofenadine  (ALLEGRA) 180 MG tablet Take 180 mg by mouth daily.    [provider]  GLUCOSAMINE-CHONDROITIN PO Take 1 tablet by mouth daily.    [provider]  montelukast (SINGULAIR) 10 MG tablet Take 10 mg by mouth daily.  06/26/13   [provider]  Probiotic Product (ALIGN) 4 MG CAPS Take 1 capsule by mouth daily.    [provider]  VENTOLIN HFA 108 (90 BASE) MCG/ACT inhaler Inhale 2 puffs into the lungs every 4 (four) hours as needed for wheezing or shortness of breath. Before exercise 04/24/13   [provider]    Allergies    Hyoscyamine  Review of Systems   Review of Systems  Constitutional: Positive for fever. Negative for chills.  HENT: Negative for congestion.   Eyes: Negative for discharge.  Respiratory: Negative for shortness of breath.   Cardiovascular: Negative for chest pain.  Gastrointestinal: Positive for abdominal pain, diarrhea, nausea and vomiting. Negative for blood in stool.  Genitourinary: Negative for difficulty urinating, dysuria and urgency.  Musculoskeletal: Negative for myalgias.  Skin: Negative for color change.  Neurological: Negative for headaches.  Psychiatric/Behavioral: Negative for confusion.    Physical Exam Updated Vital Signs BP 103/87 (BP Location: Left Arm)   Pulse 90   Temp 99.1 F (37.3 C) (Oral)   Resp 18   Ht 5\' 5"  (1.651 m)   Wt 61.2 kg   SpO2 100%   BMI 22.47 kg/m   Physical Exam Vitals and nursing note reviewed.  Constitutional:      General: She is not in acute distress.    Appearance: She is well-developed. She is not ill-appearing or toxic-appearing.  HENT:     Head: Normocephalic and atraumatic.     Nose: Nose normal.  Eyes:     General:        Right eye: No discharge.        Left eye: No discharge.     Conjunctiva/sclera: Conjunctivae normal.     Pupils: Pupils are equal, round, and reactive to light.  Cardiovascular:     Rate and Rhythm: Normal rate and regular rhythm.     Heart  sounds: Normal heart sounds. No murmur. No friction rub. No gallop.   Pulmonary:     Effort: Pulmonary effort is normal. No respiratory distress.     Breath sounds: Normal breath sounds. No stridor. No wheezing, rhonchi or rales.  Chest:     Chest wall: No tenderness.  Abdominal:     General: Abdomen is flat. Bowel sounds are normal.     Palpations: Abdomen is soft.     Tenderness: There is abdominal tenderness in the right lower quadrant, suprapubic area and left lower quadrant. There is guarding and rebound. There is no right CVA tenderness or left CVA tenderness. Negative signs include Murphy's sign, Rovsing's sign and McBurney's sign.  Musculoskeletal:        General: No tenderness. Normal range of motion.     Cervical back: Normal range of motion and neck supple.  Lymphadenopathy:     Cervical: No cervical adenopathy.  Skin:    General: Skin is warm and dry.     Capillary Refill: Capillary refill takes less than 2 seconds.  Neurological:     Mental Status: She is alert and oriented to person, place, and time.  Psychiatric:        Mood and Affect: Mood normal.        Behavior: Behavior normal.        Thought Content: Thought content normal.        Judgment: Judgment normal.     ED Results / Procedures / Treatments   Labs (all labs ordered are listed, but only abnormal results are displayed) Labs Reviewed  CBC WITH DIFFERENTIAL/PLATELET - Abnormal; Notable for the following components:      Result Value   WBC 21.9 (*)    HCT 35.8 (*)    Neutro Abs 19.8 (*)    Abs Immature Granulocytes 0.12 (*)    All other components within normal limits  COMPREHENSIVE METABOLIC PANEL - Abnormal; Notable for the following components:   CO2 20 (*)    Glucose, Bld 102 (*)    Calcium 8.3 (*)    All other components within normal limits  URINALYSIS, ROUTINE W REFLEX MICROSCOPIC - Abnormal; Notable for the following components:   Hgb urine dipstick MODERATE (*)    Ketones, ur 15 (*)     All other components within normal limits  URINALYSIS, MICROSCOPIC (REFLEX) - Abnormal; Notable for the following components:   Bacteria, UA RARE (*)    All other components within normal limits  LIPASE, BLOOD    EKG None  Radiology CT ABDOMEN PELVIS W CONTRAST  Result Date: 09/13/2019 CLINICAL DATA:  Vomiting, lower abdominal pain. EXAM: CT ABDOMEN AND PELVIS WITH CONTRAST TECHNIQUE: Multidetector CT imaging of the abdomen and pelvis was performed using the standard protocol following bolus administration of intravenous contrast. CONTRAST:  100mL OMNIPAQUE IOHEXOL 300 MG/ML  SOLN COMPARISON:  August 13, 2019. FINDINGS: Lower chest: No acute abnormality. Hepatobiliary: No focal liver abnormality is seen. Status post cholecystectomy. No biliary dilatation. Pancreas: Unremarkable. No pancreatic ductal dilatation or surrounding inflammatory changes. Spleen: Normal in size without focal abnormality. Adrenals/Urinary Tract: Adrenal glands are unremarkable. Kidneys are normal, without renal calculi, focal lesion, or hydronephrosis. Bladder is unremarkable. Stomach/Bowel: The stomach appears normal. Diverticulitis is noted at the junction of the descending and sigmoid colon. This is progressed since prior exam. No definite abscess formation is noted. No definite bowel obstruction is noted. Vascular/Lymphatic: No significant vascular findings are present. No enlarged abdominal or pelvic lymph nodes. Reproductive: Uterus and bilateral adnexa are unremarkable. Other: No abdominal wall hernia or abnormality. No abdominopelvic ascites. Musculoskeletal: No acute or significant osseous findings. IMPRESSION: Diverticulitis is noted at the junction of the descending and sigmoid colon which is progressed since prior exam. No definite abscess formation is noted. Electronically Signed   By: Lupita RaiderJames  Green Jr M.D.   On: 09/13/2019 13:33    Procedures Procedures (including critical care time)  Medications Ordered in  ED Medications  sodium chloride 0.9 % bolus 1,000 mL (has no administration in time range)  ketorolac (TORADOL) 15 MG/ML injection 15 mg (has no administration in time range)  ondansetron (ZOFRAN) injection 4 mg (has no administration in time range)  oxyCODONE-acetaminophen (  PERCOCET/ROXICET) 5-325 MG per tablet 1 tablet (has no administration in time range)    ED Course  I have reviewed the triage vital signs and the nursing notes.  Pertinent labs & imaging results that were available during my care of the patient were reviewed by me and considered in my medical decision making (see chart for details).    MDM Rules/Calculators/A&P                      49 year old presents the ER for ongoing lower abdominal pain.  Does report episode of fever last night and vomiting.  Patient does have a history of recurrent diverticulitis.  She is currently been on Augmentin for the past 7 days without any significant improvement of symptoms.  On exam patient does have diffuse lower abdominal pain to palpation.  Some guarding noted.  Patient has no peritonitis.  Heart regular rate and rhythm.  Lungs clear to auscultation.  Patient is afebrile.  Concern for possible abscess versus perforation given her worsening pain.  Labs reviewed.  Patient does have a leukocytosis of 21,000.  No significant electrolyte derangement.  Mild hypocalcemia of 8.3.  Bicarb is 20 but no signs of anion gap.  Patient given fluids in the ER.  UA shows moderate hemoglobin but no other signs of infection.  Lipase is normal.  Normal kidney function.  CT imaging was reviewed.  Patient does have persistent diverticulitis without any significant abscess formation or perforation.  Patient has been on Augmentin without any improvement of symptoms.  Patient's pain controlled in the ER.  No intractable vomiting.  Vital signs remained reassuring.  Head sure decision-making with patient.  I did offer her admission for IV antibiotics and observation  however discussed with patient that we could switch her antibiotics to Cipro and Flagyl to see if this would help give her more broad coverage for her symptoms.  Patient does have GI follow-up next week.  Patient started on Cipro and Flagyl.  She feels comfortable with discharge home at this time.  I discussed with her reason that she should return to the ER immediately.  Pt is hemodynamically stable, in NAD, & able to ambulate in the ED. Evaluation does not show pathology that would require ongoing emergent intervention or inpatient treatment. I explained the diagnosis to the patient. Pain has been managed & has no complaints prior to dc. Pt is comfortable with above plan and is stable for discharge at this time. All questions were answered prior to disposition. Strict return precautions for f/u to the ED were discussed. Encouraged follow up with PCP.  Final Clinical Impression(s) / ED Diagnoses Final diagnoses:  Diverticulitis    Rx / DC Orders ED Discharge Orders         Ordered    HYDROcodone-acetaminophen (NORCO/VICODIN) 5-325 MG tablet  Every 4 hours PRN     09/13/19 1358    ondansetron (ZOFRAN ODT) 4 MG disintegrating tablet  Every 8 hours PRN,   Status:  Discontinued     09/13/19 1358    ciprofloxacin (CIPRO) 500 MG tablet  Every 12 hours     09/13/19 1358    metroNIDAZOLE (FLAGYL) 500 MG tablet  3 times daily     09/13/19 1358    ondansetron (ZOFRAN ODT) 4 MG disintegrating tablet  Every 8 hours PRN     09/13/19 1359           Rise Mu, PA-C 09/13/19 1703    [place]ohut,  Jeannett Senior, MD 09/14/19 [phone]

## 2019-09-13 NOTE — Telephone Encounter (Signed)
Dr. Chales Abrahams, I spoke with patient, she is at [facility] ER, CTAP pending. She has office visit with you in 2 weeks. We might need to see her sooner. Await ED evaluation.  [employer]

## 2019-09-13 NOTE — Telephone Encounter (Signed)
Patient called states she is having another diverticulitis flare up. She is experiencing diarrhea every hour, vomiting, a lot of lower left pain. She has been taking her medication but she said this time has been the worst for her. Please advise

## 2019-09-13 NOTE — Telephone Encounter (Signed)
Called the patient. She is in the ED now. Encouraged to stay for full evaluation. She states she is sorry she had to go, but she was hurting so much.

## 2019-09-13 NOTE — ED Triage Notes (Signed)
Pt states that she was recently on antibiotics for diverticulitis about 2 weeks ago. Reports she ate a large meal on Saturday and has been feeling worse ever since states that she has also had diarrhea and one episode of vomiting.

## 2019-09-13 NOTE — ED Notes (Signed)
Attempted IV x 2 , tol well, unsuccessful 

## 2019-09-13 NOTE — Telephone Encounter (Signed)
Will do Thanks for letting me know  [name], Can you work her in into my next clinic RG

## 2019-09-13 NOTE — Discharge Instructions (Addendum)
Your work-up is consistent with persistent diverticulitis.  Take antibiotics as prescribed.  Have given you pain medication that can make you drowsy.  Can also take anti-inflammatories.  Take the Zofran for any nausea or vomiting.  Please have close follow-up with a GI doctor.  If you have any worsening fevers or worsening abdominal pain, vomiting despite medications return to the ER.  If your symptoms not improve the next 24 to 40 hours return to the ER for further evaluation and possible admission.

## 2019-09-15 NOTE — Telephone Encounter (Signed)
CT AP reviewed Progression of diverticulitis without abscess  Plan: -Levaquin 500 mg p.o. once a day x 10 days -Flagyl 500 mg p.o. 3 times daily x 10 days -Start probiotics 1/day  RG

## 2019-09-16 DIAGNOSIS — M545 Low back pain: Secondary | ICD-10-CM | POA: Diagnosis not present

## 2019-09-16 DIAGNOSIS — K5792 Diverticulitis of intestine, part unspecified, without perforation or abscess without bleeding: Secondary | ICD-10-CM | POA: Diagnosis not present

## 2019-09-24 ENCOUNTER — Other Ambulatory Visit: Payer: Self-pay

## 2019-09-24 ENCOUNTER — Ambulatory Visit: Payer: BC Managed Care – PPO | Admitting: Gastroenterology

## 2019-09-24 ENCOUNTER — Encounter: Payer: Self-pay | Admitting: Gastroenterology

## 2019-09-24 VITALS — BP 126/78 | HR 65 | Temp 98.2°F | Ht 65.0 in | Wt 132.0 lb

## 2019-09-24 DIAGNOSIS — K5732 Diverticulitis of large intestine without perforation or abscess without bleeding: Secondary | ICD-10-CM | POA: Diagnosis not present

## 2019-09-24 NOTE — Patient Instructions (Signed)
Start benefiber 1 tbs daily   Start to exercise on Tuesday  Call if you have any problems  It was a pleasure to see you today!  Thank you,  Dr. Lynann Bologna

## 2019-09-24 NOTE — Progress Notes (Signed)
Chief Complaint: Follow-up  Referring Provider:  Mila Palmer, MD      ASSESSMENT AND PLAN;   #1. Sigmoid diverticulitis on CT 01/05/2019, April/May 2021.  Treated with Cipro and Flagyl.  #2. H/O uneventful lap cholewithgood quality IOC d/tbiliary pancreatitis on10/28/2019 (Nl LFTs at that time). Neg post-op CT, MRCP/MRI for leak, biliary dilatation,intra-abdominal fluid collection or pancreatitis.Neg HIDAscan, neg liver biopsy. Had increased WBC count and abn LFTs likely due to ascending cholangitis. No retained CBD stones.  Responded very well to antibiotics. Still the diagnosis was not entirely clear.  She has finished a course of Levaquin. Adm to [facility] 11/13-11/16.  #3. IBS with bloating with occ dirrhea   . H/O lactose intolerance. Neg celiac screen in the past.  Plan:  - Finished antibiotics - Benefiber 1 tbs po qd. - High-fiber diet in between the attacks. - Can start exercising next week - Call if with any problems - Discussed medical/surgical management of recurrent diverticulitis.  She would like to hold off on surgical evaluation at the present time.  I do agree.  HPI:    [name] is a 49 y.o. female  For follow-up visit Had another episode of acute sigmoid diverticulitis, initially treated with Augmentin.  Unfortunately there was some progression of diverticulitis without abscess.  She was successfully treated with Cipro and Flagyl.  She does feel significantly better.  No fever chills or night sweats.  She is currently at baseline.  Accompanied by her husband.  She does feel better.  Denies having any nausea, vomiting, heartburn, regurgitation odynophagia or dysphagia.  She has intermittent softer bowel movements with abdominal bloating in the past.  Gets more diarrhea during menstrual cycles.  Gets more constipated when she travels.  Diagnosed with IBS in the past.   Past GI procedures: -Colonoscopy to TI 03/23/2019: Pancolonic diverticulosis  predominantly in the sigmoid colon.  Negative random colonic biopsies for microscopic colitis.  Normal TI. -CT AP with contrast 08/15/2019: Mild sigmoid diverticulitis. CT AP 5/24 2021 showed progression of diverticulitis without abscess.   Past Medical History:  Diagnosis Date  . Allergic rhinitis   . Asthma   . Diverticulitis 2020  . Gallstones 1/09   on CT  . IBS (irritable bowel syndrome)   . Ischemic colitis (HCC) 1/09  . Jejunal intussusception (HCC) 1/09   on CT    Past Surgical History:  Procedure Laterality Date  . CHOLECYSTECTOMY N/A 02/16/2018   Procedure: LAPAROSCOPIC CHOLECYSTECTOMY WITH INTRAOPERATIVE CHOLANGIOGRAM;  Surgeon: Darnell Level, MD;  Location: [facility];  Service: General;  Laterality: N/A;  . COLONOSCOPY  1/09    Family History  Problem Relation Age of Onset  . Diabetes Mellitus II Neg Hx   . Hypertension Neg Hx   . Colon cancer Neg Hx   . Esophageal cancer Neg Hx   . Rectal cancer Neg Hx   . Stomach cancer Neg Hx     Social History   Tobacco Use  . Smoking status: Never Smoker  . Smokeless tobacco: Never Used  Substance Use Topics  . Alcohol use: Yes    Comment: saturday  . Drug use: No    Current Outpatient Medications  Medication Sig Dispense Refill  . Probiotic Product (ALIGN) 4 MG CAPS Take 1 capsule by mouth daily.    . AUROVELA 24 FE 1-20 MG-MCG(24) tablet Take 1 tablet by mouth daily.    . calcium-vitamin D (OSCAL WITH D) 500-200 MG-UNIT tablet Take 2 tablets by mouth daily with breakfast.    .  cyclobenzaprine (FLEXERIL) 10 MG tablet Take 10 mg by mouth 3 (three) times daily as needed for muscle spasms.     . diclofenac (VOLTAREN) 75 MG EC tablet Take 75 mg by mouth 2 (two) times daily as needed for mild pain.     Marland Kitchen dicyclomine (BENTYL) 10 MG capsule Take 1 capsule (10 mg total) by mouth every 8 (eight) hours as needed for spasms. (Patient not taking: Reported on 09/24/2019) 30 capsule 0  . fexofenadine (ALLEGRA) 180 MG tablet Take 180  mg by mouth daily.    Marland Kitchen GLUCOSAMINE-CHONDROITIN PO Take 1 tablet by mouth daily.    Marland Kitchen HYDROcodone-acetaminophen (NORCO/VICODIN) 5-325 MG tablet Take 2 tablets by mouth every 4 (four) hours as needed. (Patient not taking: Reported on 09/24/2019) 10 tablet 0  . montelukast (SINGULAIR) 10 MG tablet Take 10 mg by mouth daily.     . VENTOLIN HFA 108 (90 BASE) MCG/ACT inhaler Inhale 2 puffs into the lungs every 4 (four) hours as needed for wheezing or shortness of breath. Before exercise     Current Facility-Administered Medications  Medication Dose Route Frequency Provider Last Rate Last Admin  . triamcinolone acetonide (KENALOG) 10 MG/ML injection 10 mg  10 mg Other Once Wallene Huh, DPM        Allergies  Allergen Reactions  . Hyoscyamine Hives    Review of Systems:  neg     Physical Exam:    BP 126/78   Pulse 65   Temp 98.2 F (36.8 C)   Ht 5\' 5"  (1.651 m)   Wt 132 lb (59.9 kg)   LMP 09/04/2019   BMI 21.97 kg/m  Filed Weights   09/24/19 1349  Weight: 132 lb (59.9 kg)   Constitutional:  Well-developed, in no acute distress. Psychiatric: Normal mood and affect. Behavior is normal. HEENT: Pupils normal.  Conjunctivae are normal. No scleral icterus. Cardiovascular: Normal rate, regular rhythm. No edema Pulmonary/chest: Effort normal and breath sounds normal. No wheezing, rales or rhonchi. Abdominal: Soft, nondistended. Nontender. Bowel sounds active throughout. There are no masses palpable. No hepatomegaly. Rectal:  defered Neurological: Alert and oriented to person place and time. Skin: Skin is warm and dry. No rashes noted.  Data Reviewed: I have personally reviewed following labs and imaging studies    Radiology Studies: CT ABDOMEN PELVIS W CONTRAST  Result Date: 09/13/2019 CLINICAL DATA:  Vomiting, lower abdominal pain. EXAM: CT ABDOMEN AND PELVIS WITH CONTRAST TECHNIQUE: Multidetector CT imaging of the abdomen and pelvis was performed using the standard protocol  following bolus administration of intravenous contrast. CONTRAST:  166mL OMNIPAQUE IOHEXOL 300 MG/ML  SOLN COMPARISON:  August 13, 2019. FINDINGS: Lower chest: No acute abnormality. Hepatobiliary: No focal liver abnormality is seen. Status post cholecystectomy. No biliary dilatation. Pancreas: Unremarkable. No pancreatic ductal dilatation or surrounding inflammatory changes. Spleen: Normal in size without focal abnormality. Adrenals/Urinary Tract: Adrenal glands are unremarkable. Kidneys are normal, without renal calculi, focal lesion, or hydronephrosis. Bladder is unremarkable. Stomach/Bowel: The stomach appears normal. Diverticulitis is noted at the junction of the descending and sigmoid colon. This is progressed since prior exam. No definite abscess formation is noted. No definite bowel obstruction is noted. Vascular/Lymphatic: No significant vascular findings are present. No enlarged abdominal or pelvic lymph nodes. Reproductive: Uterus and bilateral adnexa are unremarkable. Other: No abdominal wall hernia or abnormality. No abdominopelvic ascites. Musculoskeletal: No acute or significant osseous findings. IMPRESSION: Diverticulitis is noted at the junction of the descending and sigmoid colon which is progressed since prior  exam. No definite abscess formation is noted. Electronically Signed   By: Lupita Raider M.D.   On: 09/13/2019 13:33  I spent 25 minutes of face-to-face time with the patient. Greater than 50% of the time was spent counseling and coordinating care.  CT was reviewed independently.     Edman Circle, MD 09/24/2019, 2:11 PM  Cc: Mila Palmer, MD

## 2019-10-04 DIAGNOSIS — Z713 Dietary counseling and surveillance: Secondary | ICD-10-CM | POA: Diagnosis not present

## 2019-11-08 DIAGNOSIS — Z713 Dietary counseling and surveillance: Secondary | ICD-10-CM | POA: Diagnosis not present

## 2019-12-08 DIAGNOSIS — J3089 Other allergic rhinitis: Secondary | ICD-10-CM | POA: Diagnosis not present

## 2019-12-08 DIAGNOSIS — J3081 Allergic rhinitis due to animal (cat) (dog) hair and dander: Secondary | ICD-10-CM | POA: Diagnosis not present

## 2019-12-08 DIAGNOSIS — J301 Allergic rhinitis due to pollen: Secondary | ICD-10-CM | POA: Diagnosis not present

## 2019-12-08 DIAGNOSIS — J45990 Exercise induced bronchospasm: Secondary | ICD-10-CM | POA: Diagnosis not present

## 2019-12-20 DIAGNOSIS — Z713 Dietary counseling and surveillance: Secondary | ICD-10-CM | POA: Diagnosis not present

## 2020-01-24 DIAGNOSIS — M25512 Pain in left shoulder: Secondary | ICD-10-CM | POA: Diagnosis not present

## 2020-01-24 DIAGNOSIS — L659 Nonscarring hair loss, unspecified: Secondary | ICD-10-CM | POA: Diagnosis not present

## 2020-01-31 DIAGNOSIS — Z713 Dietary counseling and surveillance: Secondary | ICD-10-CM | POA: Diagnosis not present

## 2020-02-15 DIAGNOSIS — Z713 Dietary counseling and surveillance: Secondary | ICD-10-CM | POA: Diagnosis not present

## 2020-02-17 DIAGNOSIS — Z713 Dietary counseling and surveillance: Secondary | ICD-10-CM | POA: Diagnosis not present

## 2020-02-21 ENCOUNTER — Telehealth: Payer: Self-pay | Admitting: Gastroenterology

## 2020-02-21 NOTE — Telephone Encounter (Signed)
Spoke to patient who has a HX of diverticulosis. She reports having IBS-D symptoms with a lot of gas after eating. She maintains a glutin and dairy free diet. On 02/09/20 she saw Tery Culp and Rosalie Doctor who are RD for a nutrition evaluation. Patient was noted to be reacted to natural fruit sugars, soy products, shrimp and scallops. The Dietitian's recommended a SIBO test to rule out bacteria. Dr Chales Abrahams please advise.

## 2020-02-21 NOTE — Telephone Encounter (Signed)
Pt would like to have a SIBO test. She stated that she had food allergy test suggested by nutritionist. Nutritionist thinks think that pt may have bacterial overgrowth so she would like to get tested for that.

## 2020-02-23 NOTE — Telephone Encounter (Signed)
Proceed with SIBO breath test RG

## 2020-02-23 NOTE — Telephone Encounter (Signed)
Spoke to patient. She will cone by the office to pick up a SIBO breath kit today 

## 2020-02-26 DIAGNOSIS — K589 Irritable bowel syndrome without diarrhea: Secondary | ICD-10-CM | POA: Diagnosis not present

## 2020-02-26 DIAGNOSIS — R140 Abdominal distension (gaseous): Secondary | ICD-10-CM | POA: Diagnosis not present

## 2020-02-28 DIAGNOSIS — M24811 Other specific joint derangements of right shoulder, not elsewhere classified: Secondary | ICD-10-CM | POA: Diagnosis not present

## 2020-02-28 DIAGNOSIS — M67912 Unspecified disorder of synovium and tendon, left shoulder: Secondary | ICD-10-CM | POA: Diagnosis not present

## 2020-03-02 DIAGNOSIS — Z713 Dietary counseling and surveillance: Secondary | ICD-10-CM | POA: Diagnosis not present

## 2020-03-07 DIAGNOSIS — M25512 Pain in left shoulder: Secondary | ICD-10-CM | POA: Diagnosis not present

## 2020-03-10 DIAGNOSIS — M67912 Unspecified disorder of synovium and tendon, left shoulder: Secondary | ICD-10-CM | POA: Diagnosis not present

## 2020-03-23 ENCOUNTER — Telehealth: Payer: Self-pay | Admitting: Gastroenterology

## 2020-03-23 DIAGNOSIS — Z713 Dietary counseling and surveillance: Secondary | ICD-10-CM | POA: Diagnosis not present

## 2020-03-23 NOTE — Telephone Encounter (Signed)
Lmom for patient to call back 

## 2020-03-23 NOTE — Telephone Encounter (Signed)
Spoke to patient. She gave me Rosalie Doctor RD Nutritionist phone number to contact. A message was left on he voicemail the results of SIBO breath test. She was asked to call me back to fax or Email the results to her. There office will be closed until next week.

## 2020-04-11 DIAGNOSIS — Z713 Dietary counseling and surveillance: Secondary | ICD-10-CM | POA: Diagnosis not present

## 2020-04-19 DIAGNOSIS — M67912 Unspecified disorder of synovium and tendon, left shoulder: Secondary | ICD-10-CM | POA: Diagnosis not present

## 2020-04-20 DIAGNOSIS — M7541 Impingement syndrome of right shoulder: Secondary | ICD-10-CM | POA: Diagnosis not present

## 2020-04-20 DIAGNOSIS — M25512 Pain in left shoulder: Secondary | ICD-10-CM | POA: Diagnosis not present

## 2020-04-20 DIAGNOSIS — M7542 Impingement syndrome of left shoulder: Secondary | ICD-10-CM | POA: Diagnosis not present

## 2020-04-20 DIAGNOSIS — M6281 Muscle weakness (generalized): Secondary | ICD-10-CM | POA: Diagnosis not present

## 2020-04-25 DIAGNOSIS — M7541 Impingement syndrome of right shoulder: Secondary | ICD-10-CM | POA: Diagnosis not present

## 2020-04-25 DIAGNOSIS — M7542 Impingement syndrome of left shoulder: Secondary | ICD-10-CM | POA: Diagnosis not present

## 2020-04-25 DIAGNOSIS — E538 Deficiency of other specified B group vitamins: Secondary | ICD-10-CM | POA: Diagnosis not present

## 2020-04-25 DIAGNOSIS — M6281 Muscle weakness (generalized): Secondary | ICD-10-CM | POA: Diagnosis not present

## 2020-04-25 DIAGNOSIS — M25512 Pain in left shoulder: Secondary | ICD-10-CM | POA: Diagnosis not present

## 2020-04-28 DIAGNOSIS — M25512 Pain in left shoulder: Secondary | ICD-10-CM | POA: Diagnosis not present

## 2020-04-28 DIAGNOSIS — M7541 Impingement syndrome of right shoulder: Secondary | ICD-10-CM | POA: Diagnosis not present

## 2020-04-28 DIAGNOSIS — M7542 Impingement syndrome of left shoulder: Secondary | ICD-10-CM | POA: Diagnosis not present

## 2020-04-28 DIAGNOSIS — M6281 Muscle weakness (generalized): Secondary | ICD-10-CM | POA: Diagnosis not present

## 2020-05-02 DIAGNOSIS — M25512 Pain in left shoulder: Secondary | ICD-10-CM | POA: Diagnosis not present

## 2020-05-02 DIAGNOSIS — Z713 Dietary counseling and surveillance: Secondary | ICD-10-CM | POA: Diagnosis not present

## 2020-05-02 DIAGNOSIS — M7541 Impingement syndrome of right shoulder: Secondary | ICD-10-CM | POA: Diagnosis not present

## 2020-05-02 DIAGNOSIS — M7542 Impingement syndrome of left shoulder: Secondary | ICD-10-CM | POA: Diagnosis not present

## 2020-05-02 DIAGNOSIS — M6281 Muscle weakness (generalized): Secondary | ICD-10-CM | POA: Diagnosis not present

## 2020-05-04 DIAGNOSIS — M6281 Muscle weakness (generalized): Secondary | ICD-10-CM | POA: Diagnosis not present

## 2020-05-04 DIAGNOSIS — M25512 Pain in left shoulder: Secondary | ICD-10-CM | POA: Diagnosis not present

## 2020-05-04 DIAGNOSIS — M7542 Impingement syndrome of left shoulder: Secondary | ICD-10-CM | POA: Diagnosis not present

## 2020-05-04 DIAGNOSIS — M7541 Impingement syndrome of right shoulder: Secondary | ICD-10-CM | POA: Diagnosis not present

## 2020-05-10 DIAGNOSIS — M6281 Muscle weakness (generalized): Secondary | ICD-10-CM | POA: Diagnosis not present

## 2020-05-10 DIAGNOSIS — M7541 Impingement syndrome of right shoulder: Secondary | ICD-10-CM | POA: Diagnosis not present

## 2020-05-10 DIAGNOSIS — M7542 Impingement syndrome of left shoulder: Secondary | ICD-10-CM | POA: Diagnosis not present

## 2020-05-10 DIAGNOSIS — M25512 Pain in left shoulder: Secondary | ICD-10-CM | POA: Diagnosis not present

## 2020-05-22 DIAGNOSIS — M6281 Muscle weakness (generalized): Secondary | ICD-10-CM | POA: Diagnosis not present

## 2020-05-22 DIAGNOSIS — M7542 Impingement syndrome of left shoulder: Secondary | ICD-10-CM | POA: Diagnosis not present

## 2020-05-22 DIAGNOSIS — M7541 Impingement syndrome of right shoulder: Secondary | ICD-10-CM | POA: Diagnosis not present

## 2020-05-22 DIAGNOSIS — M25512 Pain in left shoulder: Secondary | ICD-10-CM | POA: Diagnosis not present

## 2020-05-29 DIAGNOSIS — M6281 Muscle weakness (generalized): Secondary | ICD-10-CM | POA: Diagnosis not present

## 2020-05-29 DIAGNOSIS — M7541 Impingement syndrome of right shoulder: Secondary | ICD-10-CM | POA: Diagnosis not present

## 2020-05-29 DIAGNOSIS — M25512 Pain in left shoulder: Secondary | ICD-10-CM | POA: Diagnosis not present

## 2020-05-29 DIAGNOSIS — M7542 Impingement syndrome of left shoulder: Secondary | ICD-10-CM | POA: Diagnosis not present

## 2020-05-29 DIAGNOSIS — M24811 Other specific joint derangements of right shoulder, not elsewhere classified: Secondary | ICD-10-CM | POA: Diagnosis not present

## 2020-05-31 DIAGNOSIS — M25512 Pain in left shoulder: Secondary | ICD-10-CM | POA: Diagnosis not present

## 2020-05-31 DIAGNOSIS — M7542 Impingement syndrome of left shoulder: Secondary | ICD-10-CM | POA: Diagnosis not present

## 2020-05-31 DIAGNOSIS — M6281 Muscle weakness (generalized): Secondary | ICD-10-CM | POA: Diagnosis not present

## 2020-05-31 DIAGNOSIS — M7541 Impingement syndrome of right shoulder: Secondary | ICD-10-CM | POA: Diagnosis not present

## 2020-06-05 DIAGNOSIS — M6281 Muscle weakness (generalized): Secondary | ICD-10-CM | POA: Diagnosis not present

## 2020-06-05 DIAGNOSIS — M7541 Impingement syndrome of right shoulder: Secondary | ICD-10-CM | POA: Diagnosis not present

## 2020-06-05 DIAGNOSIS — M7542 Impingement syndrome of left shoulder: Secondary | ICD-10-CM | POA: Diagnosis not present

## 2020-06-05 DIAGNOSIS — M25512 Pain in left shoulder: Secondary | ICD-10-CM | POA: Diagnosis not present

## 2020-06-13 DIAGNOSIS — Z713 Dietary counseling and surveillance: Secondary | ICD-10-CM | POA: Diagnosis not present

## 2020-06-15 DIAGNOSIS — M19011 Primary osteoarthritis, right shoulder: Secondary | ICD-10-CM | POA: Diagnosis not present

## 2020-06-15 DIAGNOSIS — M7541 Impingement syndrome of right shoulder: Secondary | ICD-10-CM | POA: Diagnosis not present

## 2020-06-15 DIAGNOSIS — G8918 Other acute postprocedural pain: Secondary | ICD-10-CM | POA: Diagnosis not present

## 2020-06-15 DIAGNOSIS — M7551 Bursitis of right shoulder: Secondary | ICD-10-CM | POA: Diagnosis not present

## 2020-06-21 DIAGNOSIS — Z9889 Other specified postprocedural states: Secondary | ICD-10-CM | POA: Diagnosis not present

## 2020-06-30 DIAGNOSIS — Z23 Encounter for immunization: Secondary | ICD-10-CM | POA: Diagnosis not present

## 2020-06-30 DIAGNOSIS — Z13220 Encounter for screening for lipoid disorders: Secondary | ICD-10-CM | POA: Diagnosis not present

## 2020-06-30 DIAGNOSIS — Z0000 Encounter for general adult medical examination without abnormal findings: Secondary | ICD-10-CM | POA: Diagnosis not present

## 2020-06-30 DIAGNOSIS — Z8719 Personal history of other diseases of the digestive system: Secondary | ICD-10-CM | POA: Diagnosis not present

## 2020-07-04 DIAGNOSIS — M6281 Muscle weakness (generalized): Secondary | ICD-10-CM | POA: Diagnosis not present

## 2020-07-04 DIAGNOSIS — M25511 Pain in right shoulder: Secondary | ICD-10-CM | POA: Diagnosis not present

## 2020-07-04 DIAGNOSIS — Z9889 Other specified postprocedural states: Secondary | ICD-10-CM | POA: Diagnosis not present

## 2020-07-07 DIAGNOSIS — Z9889 Other specified postprocedural states: Secondary | ICD-10-CM | POA: Diagnosis not present

## 2020-07-07 DIAGNOSIS — M6281 Muscle weakness (generalized): Secondary | ICD-10-CM | POA: Diagnosis not present

## 2020-07-07 DIAGNOSIS — M25511 Pain in right shoulder: Secondary | ICD-10-CM | POA: Diagnosis not present

## 2020-07-11 DIAGNOSIS — Z9889 Other specified postprocedural states: Secondary | ICD-10-CM | POA: Diagnosis not present

## 2020-07-11 DIAGNOSIS — M6281 Muscle weakness (generalized): Secondary | ICD-10-CM | POA: Diagnosis not present

## 2020-07-11 DIAGNOSIS — M25511 Pain in right shoulder: Secondary | ICD-10-CM | POA: Diagnosis not present

## 2020-07-14 DIAGNOSIS — M25511 Pain in right shoulder: Secondary | ICD-10-CM | POA: Diagnosis not present

## 2020-07-14 DIAGNOSIS — M6281 Muscle weakness (generalized): Secondary | ICD-10-CM | POA: Diagnosis not present

## 2020-07-14 DIAGNOSIS — Z9889 Other specified postprocedural states: Secondary | ICD-10-CM | POA: Diagnosis not present

## 2020-07-18 DIAGNOSIS — Z9889 Other specified postprocedural states: Secondary | ICD-10-CM | POA: Diagnosis not present

## 2020-07-18 DIAGNOSIS — M6281 Muscle weakness (generalized): Secondary | ICD-10-CM | POA: Diagnosis not present

## 2020-07-18 DIAGNOSIS — M25511 Pain in right shoulder: Secondary | ICD-10-CM | POA: Diagnosis not present

## 2020-07-20 DIAGNOSIS — M25511 Pain in right shoulder: Secondary | ICD-10-CM | POA: Diagnosis not present

## 2020-07-20 DIAGNOSIS — Z9889 Other specified postprocedural states: Secondary | ICD-10-CM | POA: Diagnosis not present

## 2020-07-20 DIAGNOSIS — M6281 Muscle weakness (generalized): Secondary | ICD-10-CM | POA: Diagnosis not present

## 2020-07-25 DIAGNOSIS — M6281 Muscle weakness (generalized): Secondary | ICD-10-CM | POA: Diagnosis not present

## 2020-07-25 DIAGNOSIS — M25511 Pain in right shoulder: Secondary | ICD-10-CM | POA: Diagnosis not present

## 2020-07-25 DIAGNOSIS — Z9889 Other specified postprocedural states: Secondary | ICD-10-CM | POA: Diagnosis not present

## 2020-07-27 DIAGNOSIS — M6281 Muscle weakness (generalized): Secondary | ICD-10-CM | POA: Diagnosis not present

## 2020-07-27 DIAGNOSIS — Z9889 Other specified postprocedural states: Secondary | ICD-10-CM | POA: Diagnosis not present

## 2020-07-27 DIAGNOSIS — M25511 Pain in right shoulder: Secondary | ICD-10-CM | POA: Diagnosis not present

## 2020-08-01 DIAGNOSIS — Z9889 Other specified postprocedural states: Secondary | ICD-10-CM | POA: Diagnosis not present

## 2020-08-01 DIAGNOSIS — M6281 Muscle weakness (generalized): Secondary | ICD-10-CM | POA: Diagnosis not present

## 2020-08-01 DIAGNOSIS — M25511 Pain in right shoulder: Secondary | ICD-10-CM | POA: Diagnosis not present

## 2020-08-03 DIAGNOSIS — M6281 Muscle weakness (generalized): Secondary | ICD-10-CM | POA: Diagnosis not present

## 2020-08-03 DIAGNOSIS — M25511 Pain in right shoulder: Secondary | ICD-10-CM | POA: Diagnosis not present

## 2020-08-03 DIAGNOSIS — Z9889 Other specified postprocedural states: Secondary | ICD-10-CM | POA: Diagnosis not present

## 2020-08-05 IMAGING — MR MR MRCP
8 of 12 series · 22 of 48 positions shown · non-contrast
Comparison: CT abdomen/pelvis dated 02/13/2018

CLINICAL DATA: Epigastric pain, vomiting, suspected acute
pancreatitis on CT

EXAM:
MRI ABDOMEN WITHOUT CONTRAST
TECHNIQUE: Multiplanar multisequence MR imaging was performed without the
administration of intravenous contrast.

[Series 3: T2 fat-sat · axial · 5.0mm · 0.86mm/px · z∈[-74,+191]mm · 2 of 54 slices shown]
[im 1/54]
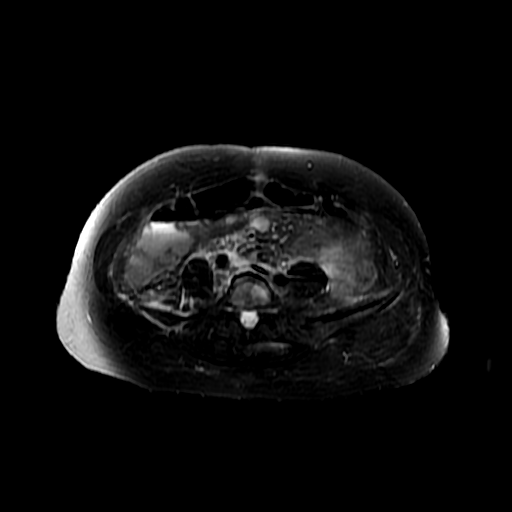
[im 54/54]
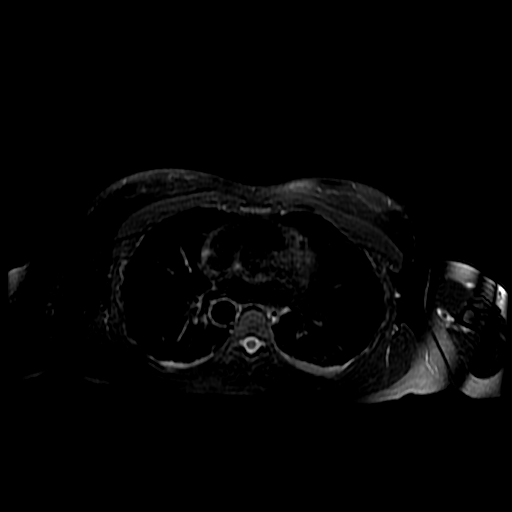

[Series 5: MRCP · coronal · 2.0mm · 0.70mm/px · 3 of 58 slices shown (1 of 2)]
[im 1/58]
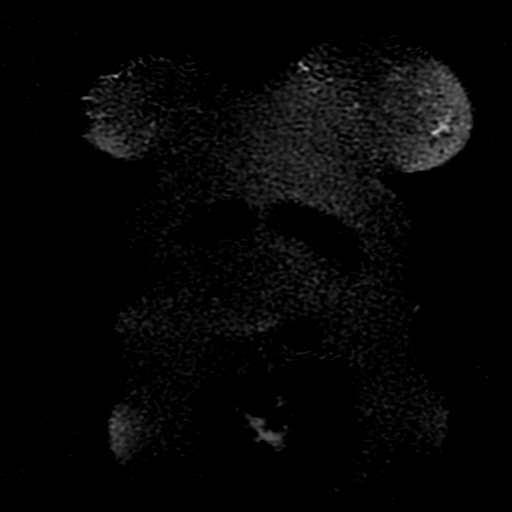
[im 29/58]
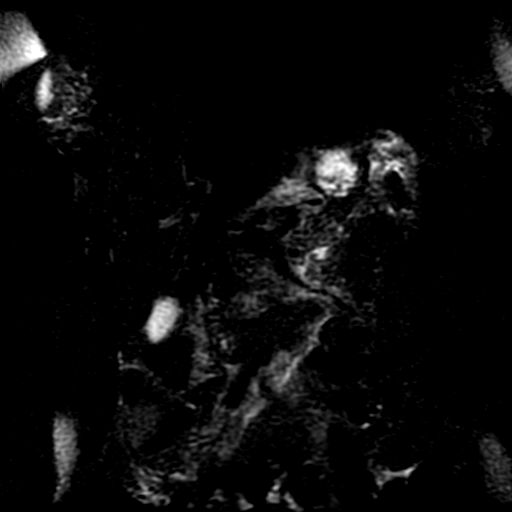
[im 58/58]
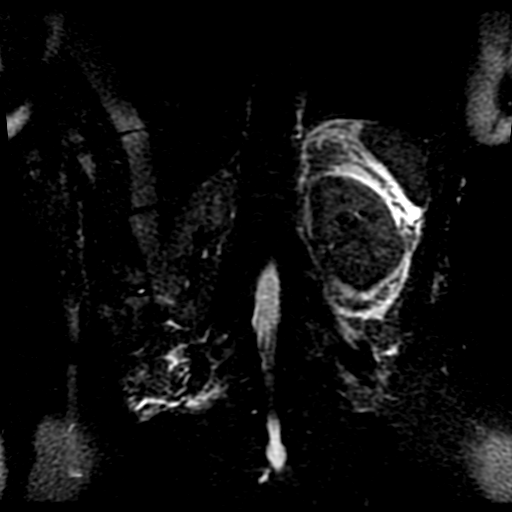

[Series 6: DWI b500 · axial · 6.0mm · 1.76mm/px · z∈[-92,+205]mm · 4 of 78 slices shown]
[im 1/78]
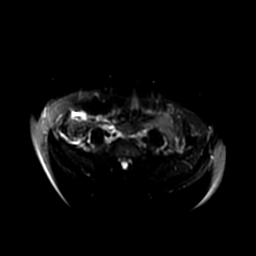
[im 26/78]
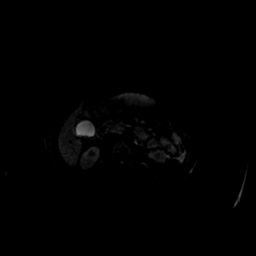
[im 52/78]
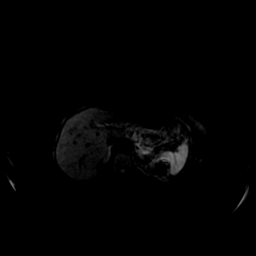
[im 78/78]
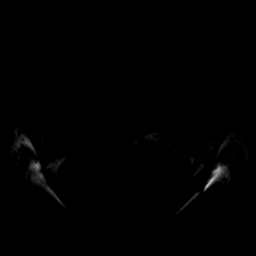

[Series 7: bSSFP fat-sat · coronal · 5.0mm · 0.78mm/px · 2 of 43 slices shown]
[im 1/43]
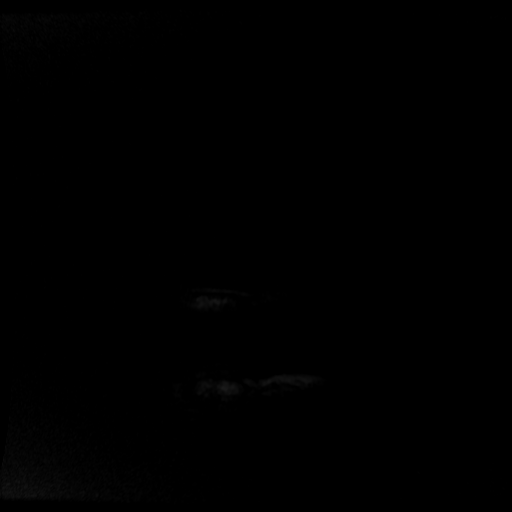
[im 43/43]
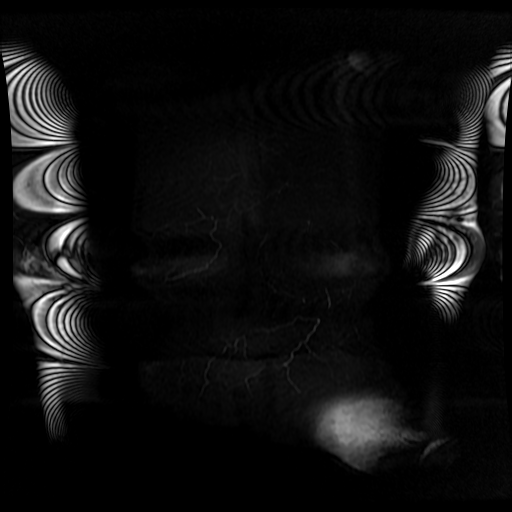

[Series 8: T2 · axial · 5.0mm · 0.86mm/px · z∈[-74,+191]mm · 3 of 54 slices shown]
[im 1/54]
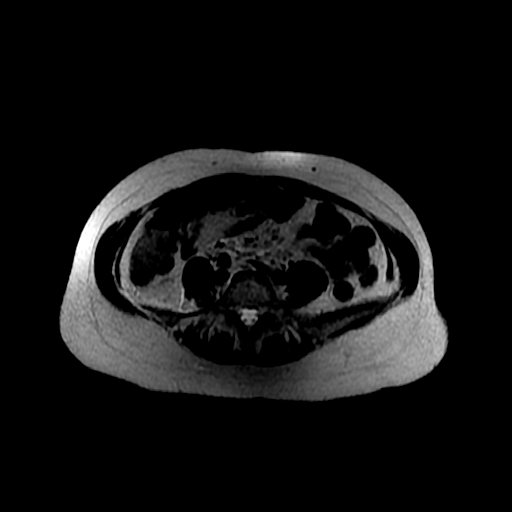
[im 27/54]
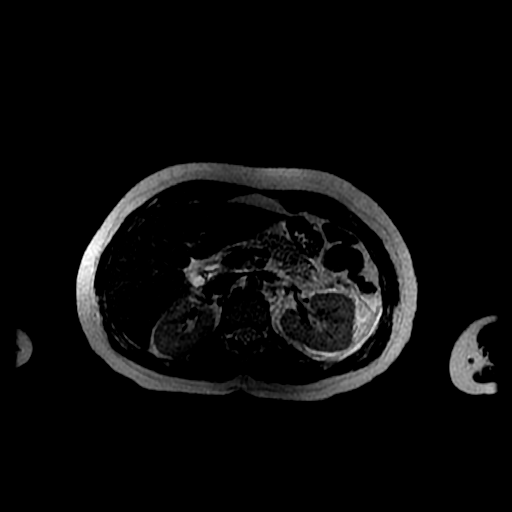
[im 54/54]
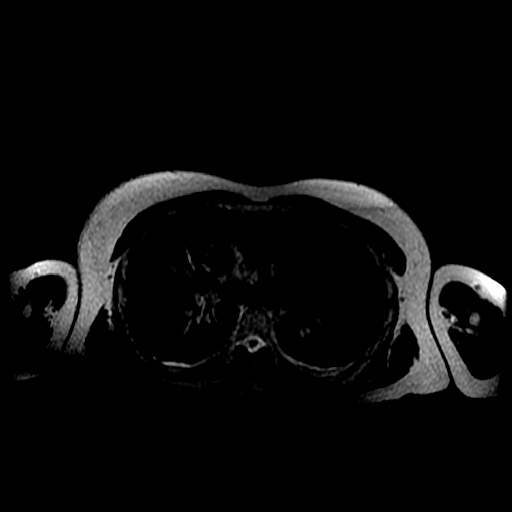

[Series 9: ax dualecho · axial · 5.0mm · 0.86mm/px · z∈[-74,+191]mm · 6 of 108 slices shown]
[im 1/108]
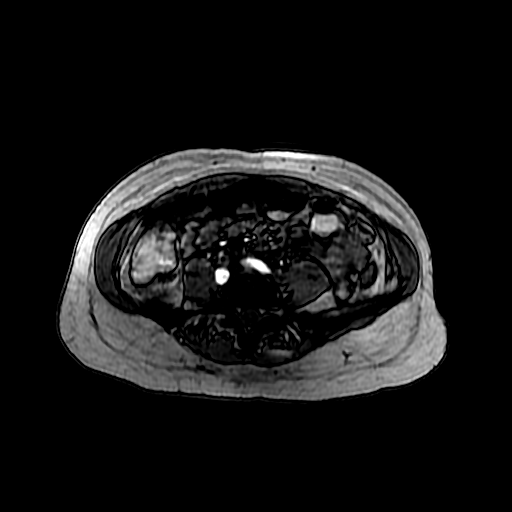
[im 22/108]
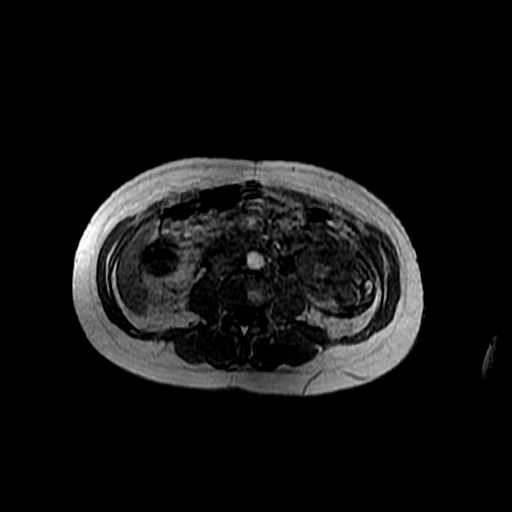
[im 43/108]
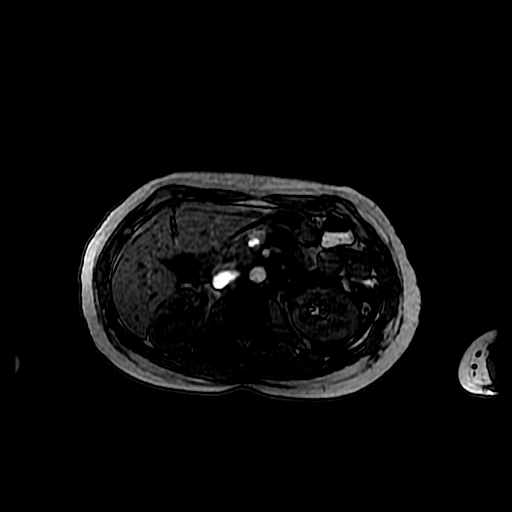
[im 65/108]
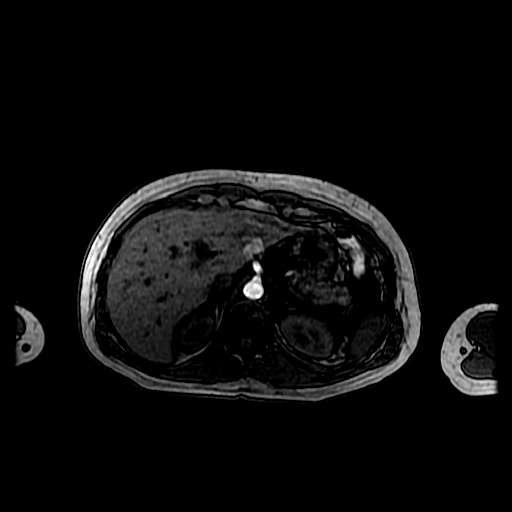
[im 86/108]
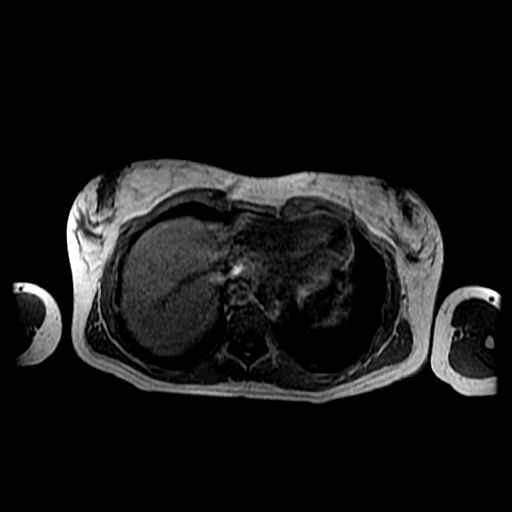
[im 108/108]
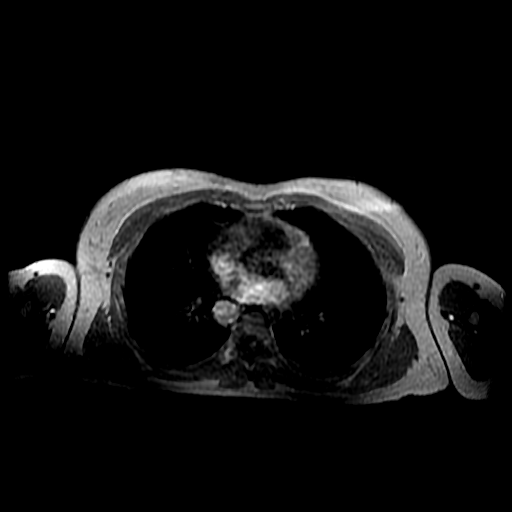

[Series 10: MRCP · coronal · 40.0mm · 0.70mm/px · 1 of 9 slices shown (2 of 2)]
[im 1/9]
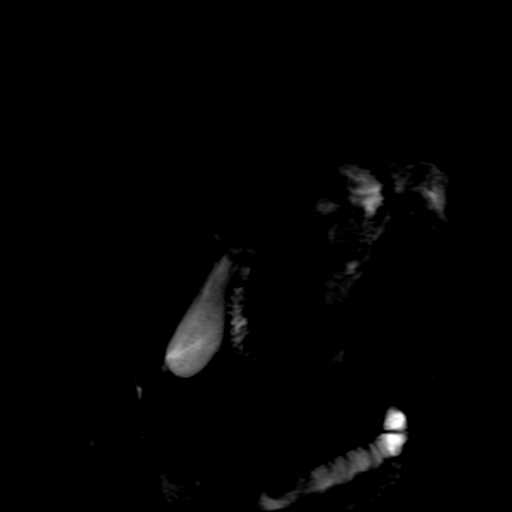

[Series 11: T1 dynamic · axial · 5.4mm · 0.78mm/px · 1 of 96 slices shown]
[im 1/96]
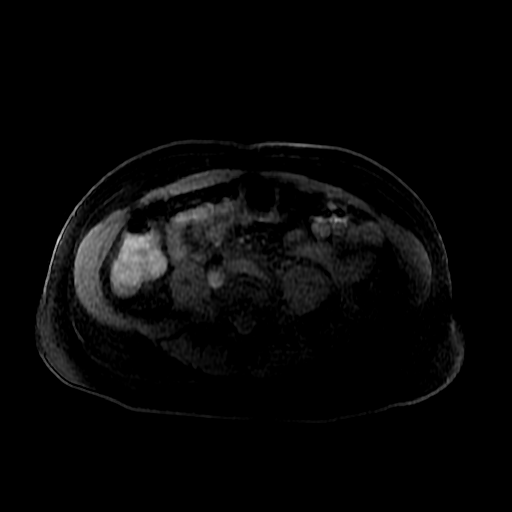

[22 of 48 positions shown; findings below may reference images not displayed]

FINDINGS: Lower chest: Trace bilateral pleural effusions, left greater than
right.

Hepatobiliary: Liver is within normal limits.

Layering small gallstones (series 3/image 40), without gallbladder
wall thickening or pericholecystic fluid.

No intrahepatic or extrahepatic ductal dilatation. Common duct
measures 4 mm. No choledocholithiasis is seen.

Pancreas: Peripancreatic fluid/inflammatory changes, suggesting
acute pancreatitis. No pancreatic ductal dilatation. No discrete
fluid collection/pseudocyst.

Spleen:  Within normal limits.

Adrenals/Urinary Tract:  Adrenal glands are within normal limits.

Kidneys are within normal limits. Perinephric fluid along the left
kidney is likely reactive.

Stomach/Bowel: Stomach and visualized bowel are grossly
unremarkable.

Vascular/Lymphatic:  No evidence of abdominal aortic aneurysm.

No suspicious abdominal lymphadenopathy.

Other: Peripancreatic fluid/edema in the left upper abdomen along
the pancreatic tail.

Musculoskeletal: No focal osseous lesions.
IMPRESSION: Acute pancreatitis.  No drainable fluid collection/pseudocyst.

Cholelithiasis, without associated inflammatory changes. No
choledocholithiasis is seen.

## 2020-08-15 DIAGNOSIS — Z713 Dietary counseling and surveillance: Secondary | ICD-10-CM | POA: Diagnosis not present

## 2020-08-30 DIAGNOSIS — Z9889 Other specified postprocedural states: Secondary | ICD-10-CM | POA: Diagnosis not present

## 2020-10-11 DIAGNOSIS — Z6823 Body mass index (BMI) 23.0-23.9, adult: Secondary | ICD-10-CM | POA: Diagnosis not present

## 2020-10-11 DIAGNOSIS — Z01419 Encounter for gynecological examination (general) (routine) without abnormal findings: Secondary | ICD-10-CM | POA: Diagnosis not present

## 2020-10-11 DIAGNOSIS — Z1231 Encounter for screening mammogram for malignant neoplasm of breast: Secondary | ICD-10-CM | POA: Diagnosis not present

## 2020-10-12 ENCOUNTER — Other Ambulatory Visit: Payer: Self-pay | Admitting: Obstetrics and Gynecology

## 2020-10-12 DIAGNOSIS — R928 Other abnormal and inconclusive findings on diagnostic imaging of breast: Secondary | ICD-10-CM

## 2020-11-17 ENCOUNTER — Other Ambulatory Visit: Payer: Self-pay | Admitting: Obstetrics and Gynecology

## 2020-11-17 ENCOUNTER — Ambulatory Visit
Admission: RE | Admit: 2020-11-17 | Discharge: 2020-11-17 | Disposition: A | Discharge status: Home / Self Care | Payer: BC Managed Care – PPO | Source: Ambulatory Visit | Attending: Obstetrics and Gynecology | Admitting: Obstetrics and Gynecology

## 2020-11-17 ENCOUNTER — Other Ambulatory Visit: Payer: Self-pay

## 2020-11-17 DIAGNOSIS — R922 Inconclusive mammogram: Secondary | ICD-10-CM | POA: Diagnosis not present

## 2020-11-17 DIAGNOSIS — R928 Other abnormal and inconclusive findings on diagnostic imaging of breast: Secondary | ICD-10-CM | POA: Diagnosis not present

## 2020-11-17 DIAGNOSIS — N6489 Other specified disorders of breast: Secondary | ICD-10-CM | POA: Diagnosis not present

## 2020-12-12 DIAGNOSIS — J301 Allergic rhinitis due to pollen: Secondary | ICD-10-CM | POA: Diagnosis not present

## 2020-12-12 DIAGNOSIS — J3089 Other allergic rhinitis: Secondary | ICD-10-CM | POA: Diagnosis not present

## 2020-12-12 DIAGNOSIS — J3081 Allergic rhinitis due to animal (cat) (dog) hair and dander: Secondary | ICD-10-CM | POA: Diagnosis not present

## 2020-12-12 DIAGNOSIS — J45990 Exercise induced bronchospasm: Secondary | ICD-10-CM | POA: Diagnosis not present

## 2021-01-02 DIAGNOSIS — Z713 Dietary counseling and surveillance: Secondary | ICD-10-CM | POA: Diagnosis not present

## 2021-02-27 DIAGNOSIS — Z713 Dietary counseling and surveillance: Secondary | ICD-10-CM | POA: Diagnosis not present

## 2021-03-29 DIAGNOSIS — L814 Other melanin hyperpigmentation: Secondary | ICD-10-CM | POA: Diagnosis not present

## 2021-03-29 DIAGNOSIS — D225 Melanocytic nevi of trunk: Secondary | ICD-10-CM | POA: Diagnosis not present

## 2021-03-29 DIAGNOSIS — Z86018 Personal history of other benign neoplasm: Secondary | ICD-10-CM | POA: Diagnosis not present

## 2021-03-29 DIAGNOSIS — D239 Other benign neoplasm of skin, unspecified: Secondary | ICD-10-CM | POA: Diagnosis not present

## 2021-05-08 DIAGNOSIS — Z713 Dietary counseling and surveillance: Secondary | ICD-10-CM | POA: Diagnosis not present

## 2021-05-21 ENCOUNTER — Ambulatory Visit
Admission: RE | Admit: 2021-05-21 | Discharge: 2021-05-21 | Disposition: A | Discharge status: Home / Self Care | Payer: BC Managed Care – PPO | Source: Ambulatory Visit | Attending: Obstetrics and Gynecology | Admitting: Obstetrics and Gynecology

## 2021-05-21 ENCOUNTER — Ambulatory Visit: Admission: RE | Admit: 2021-05-21 | Payer: BC Managed Care – PPO | Source: Ambulatory Visit

## 2021-05-21 ENCOUNTER — Other Ambulatory Visit: Payer: Self-pay | Admitting: Obstetrics and Gynecology

## 2021-05-21 DIAGNOSIS — R928 Other abnormal and inconclusive findings on diagnostic imaging of breast: Secondary | ICD-10-CM

## 2021-05-21 DIAGNOSIS — R922 Inconclusive mammogram: Secondary | ICD-10-CM | POA: Diagnosis not present

## 2021-06-25 DIAGNOSIS — L700 Acne vulgaris: Secondary | ICD-10-CM | POA: Diagnosis not present

## 2021-07-03 DIAGNOSIS — Z713 Dietary counseling and surveillance: Secondary | ICD-10-CM | POA: Diagnosis not present

## 2021-07-13 DIAGNOSIS — R109 Unspecified abdominal pain: Secondary | ICD-10-CM | POA: Diagnosis not present

## 2021-08-06 DIAGNOSIS — F418 Other specified anxiety disorders: Secondary | ICD-10-CM | POA: Diagnosis not present

## 2021-08-07 DIAGNOSIS — Z0000 Encounter for general adult medical examination without abnormal findings: Secondary | ICD-10-CM | POA: Diagnosis not present

## 2021-08-07 DIAGNOSIS — Z8719 Personal history of other diseases of the digestive system: Secondary | ICD-10-CM | POA: Diagnosis not present

## 2021-08-07 DIAGNOSIS — Z13220 Encounter for screening for lipoid disorders: Secondary | ICD-10-CM | POA: Diagnosis not present

## 2021-08-07 DIAGNOSIS — E538 Deficiency of other specified B group vitamins: Secondary | ICD-10-CM | POA: Diagnosis not present

## 2021-08-28 DIAGNOSIS — Z713 Dietary counseling and surveillance: Secondary | ICD-10-CM | POA: Diagnosis not present

## 2021-10-12 DIAGNOSIS — F418 Other specified anxiety disorders: Secondary | ICD-10-CM | POA: Diagnosis not present

## 2021-10-12 DIAGNOSIS — N920 Excessive and frequent menstruation with regular cycle: Secondary | ICD-10-CM | POA: Diagnosis not present

## 2021-10-12 DIAGNOSIS — Z01419 Encounter for gynecological examination (general) (routine) without abnormal findings: Secondary | ICD-10-CM | POA: Diagnosis not present

## 2021-10-12 DIAGNOSIS — Z6824 Body mass index (BMI) 24.0-24.9, adult: Secondary | ICD-10-CM | POA: Diagnosis not present

## 2021-10-30 DIAGNOSIS — Z713 Dietary counseling and surveillance: Secondary | ICD-10-CM | POA: Diagnosis not present

## 2021-11-16 ENCOUNTER — Ambulatory Visit
Admission: RE | Admit: 2021-11-16 | Discharge: 2021-11-16 | Disposition: A | Discharge status: Home / Self Care | Payer: BC Managed Care – PPO | Source: Ambulatory Visit | Attending: Obstetrics and Gynecology | Admitting: Obstetrics and Gynecology

## 2021-11-16 DIAGNOSIS — R922 Inconclusive mammogram: Secondary | ICD-10-CM | POA: Diagnosis not present

## 2021-11-16 DIAGNOSIS — R928 Other abnormal and inconclusive findings on diagnostic imaging of breast: Secondary | ICD-10-CM

## 2021-12-12 DIAGNOSIS — J3081 Allergic rhinitis due to animal (cat) (dog) hair and dander: Secondary | ICD-10-CM | POA: Diagnosis not present

## 2021-12-12 DIAGNOSIS — J3089 Other allergic rhinitis: Secondary | ICD-10-CM | POA: Diagnosis not present

## 2021-12-12 DIAGNOSIS — J45990 Exercise induced bronchospasm: Secondary | ICD-10-CM | POA: Diagnosis not present

## 2021-12-12 DIAGNOSIS — J301 Allergic rhinitis due to pollen: Secondary | ICD-10-CM | POA: Diagnosis not present

## 2021-12-25 DIAGNOSIS — Z713 Dietary counseling and surveillance: Secondary | ICD-10-CM | POA: Diagnosis not present

## 2022-02-26 DIAGNOSIS — Z713 Dietary counseling and surveillance: Secondary | ICD-10-CM | POA: Diagnosis not present

## 2022-04-02 ENCOUNTER — Telehealth: Payer: Self-pay | Admitting: *Deleted

## 2022-04-02 DIAGNOSIS — D239 Other benign neoplasm of skin, unspecified: Secondary | ICD-10-CM | POA: Diagnosis not present

## 2022-04-02 DIAGNOSIS — D485 Neoplasm of uncertain behavior of skin: Secondary | ICD-10-CM | POA: Diagnosis not present

## 2022-04-02 DIAGNOSIS — Z86018 Personal history of other benign neoplasm: Secondary | ICD-10-CM | POA: Diagnosis not present

## 2022-04-02 DIAGNOSIS — D225 Melanocytic nevi of trunk: Secondary | ICD-10-CM | POA: Diagnosis not present

## 2022-04-02 DIAGNOSIS — L814 Other melanin hyperpigmentation: Secondary | ICD-10-CM | POA: Diagnosis not present

## 2022-04-02 DIAGNOSIS — D224 Melanocytic nevi of scalp and neck: Secondary | ICD-10-CM | POA: Diagnosis not present

## 2022-04-02 NOTE — Patient Outreach (Signed)
  Care Coordination   04/02/2022 Name: [name] MRN: [id number] DOB: 01/20/1971   Care Coordination Outreach Attempts:  An unsuccessful telephone outreach was attempted today to offer the patient information about available care coordination services as a benefit of their health plan.   Follow Up Plan:  Additional outreach attempts will be made to offer the patient care coordination information and services.   Encounter Outcome:  No Answer   Care Coordination Interventions:  No, not indicated    Elliot Cousin, RN Care Management Coordinator [facility] Main Office [phone]

## 2022-04-09 ENCOUNTER — Telehealth: Payer: Self-pay | Admitting: *Deleted

## 2022-04-09 ENCOUNTER — Encounter: Payer: Self-pay | Admitting: *Deleted

## 2022-04-09 NOTE — Patient Instructions (Signed)
Visit Information  Thank you for taking time to visit with me today. Please don't hesitate to contact me if I can be of assistance to you.   Following are the goals we discussed today:   Goals Addressed               This Visit's Progress     COMPLETED: No needs (pt-stated)        Care Coordination Interventions: Reviewed medications with patient and discussed adherence with medications with no needed refills Reviewed scheduled/upcoming provider appointments including sufficient transportation source Screening for signs and symptoms of depression related to chronic disease state  Assessed social determinant of health barriers Care coordination services also introduced with no specified needs at this time.           Please call the care guide team at [phone] if you need to cancel or reschedule your appointment.   If you are experiencing a Mental Health or Behavioral Health Crisis or need someone to talk to, please call the Suicide and Crisis Lifeline: 988  Patient verbalizes understanding of instructions and care plan provided today and agrees to view in MyChart. Active MyChart status and patient understanding of how to access instructions and care plan via MyChart confirmed with patient.     No further follow up required: No further follow up needs  Elliot Cousin, RN Care Management Coordinator [facility] Main Office [phone]

## 2022-04-09 NOTE — Patient Outreach (Signed)
  Care Coordination   Initial Visit Note   04/09/2022 Name: [name] MRN: [id number] DOB: 1970-07-10  [name] is a 51 y.o. year old female who sees Mila Palmer, MD for primary care. I spoke with  Lovina Reach by phone today.  What matters to the patients health and wellness today?  No needs    Goals Addressed               This Visit's Progress     COMPLETED: No needs (pt-stated)        Care Coordination Interventions: Reviewed medications with patient and discussed adherence with medications with no needed refills Reviewed scheduled/upcoming provider appointments including sufficient transportation source Screening for signs and symptoms of depression related to chronic disease state  Assessed social determinant of health barriers Care coordination services also introduced with no specified needs at this time.           SDOH assessments and interventions completed:  Yes  SDOH Interventions Today    Flowsheet Row Most Recent Value  SDOH Interventions   Food Insecurity Interventions Intervention Not Indicated  Housing Interventions Intervention Not Indicated  Transportation Interventions Intervention Not Indicated  Utilities Interventions Intervention Not Indicated        Care Coordination Interventions:  Yes, provided   Follow up plan: No further intervention required.   Encounter Outcome:  Pt. Visit Completed   Elliot Cousin, RN Care Management Coordinator [facility] Main Office [phone]

## 2022-05-07 DIAGNOSIS — Z713 Dietary counseling and surveillance: Secondary | ICD-10-CM | POA: Diagnosis not present

## 2022-06-12 DIAGNOSIS — R21 Rash and other nonspecific skin eruption: Secondary | ICD-10-CM | POA: Diagnosis not present

## 2022-06-26 DIAGNOSIS — L811 Chloasma: Secondary | ICD-10-CM | POA: Diagnosis not present

## 2022-06-26 DIAGNOSIS — L209 Atopic dermatitis, unspecified: Secondary | ICD-10-CM | POA: Diagnosis not present

## 2022-06-26 DIAGNOSIS — L700 Acne vulgaris: Secondary | ICD-10-CM | POA: Diagnosis not present

## 2022-07-04 DIAGNOSIS — J3089 Other allergic rhinitis: Secondary | ICD-10-CM | POA: Diagnosis not present

## 2022-07-04 DIAGNOSIS — J301 Allergic rhinitis due to pollen: Secondary | ICD-10-CM | POA: Diagnosis not present

## 2022-07-04 DIAGNOSIS — J3081 Allergic rhinitis due to animal (cat) (dog) hair and dander: Secondary | ICD-10-CM | POA: Diagnosis not present

## 2022-07-04 DIAGNOSIS — J45990 Exercise induced bronchospasm: Secondary | ICD-10-CM | POA: Diagnosis not present

## 2022-07-10 DIAGNOSIS — J3081 Allergic rhinitis due to animal (cat) (dog) hair and dander: Secondary | ICD-10-CM | POA: Diagnosis not present

## 2022-07-10 DIAGNOSIS — J301 Allergic rhinitis due to pollen: Secondary | ICD-10-CM | POA: Diagnosis not present

## 2022-07-11 DIAGNOSIS — J3089 Other allergic rhinitis: Secondary | ICD-10-CM | POA: Diagnosis not present

## 2022-07-16 DIAGNOSIS — Z713 Dietary counseling and surveillance: Secondary | ICD-10-CM | POA: Diagnosis not present

## 2022-07-30 DIAGNOSIS — J3089 Other allergic rhinitis: Secondary | ICD-10-CM | POA: Diagnosis not present

## 2022-07-30 DIAGNOSIS — J301 Allergic rhinitis due to pollen: Secondary | ICD-10-CM | POA: Diagnosis not present

## 2022-07-30 DIAGNOSIS — J3081 Allergic rhinitis due to animal (cat) (dog) hair and dander: Secondary | ICD-10-CM | POA: Diagnosis not present

## 2022-08-01 DIAGNOSIS — J3089 Other allergic rhinitis: Secondary | ICD-10-CM | POA: Diagnosis not present

## 2022-08-01 DIAGNOSIS — J301 Allergic rhinitis due to pollen: Secondary | ICD-10-CM | POA: Diagnosis not present

## 2022-08-01 DIAGNOSIS — J3081 Allergic rhinitis due to animal (cat) (dog) hair and dander: Secondary | ICD-10-CM | POA: Diagnosis not present

## 2022-08-06 DIAGNOSIS — J3089 Other allergic rhinitis: Secondary | ICD-10-CM | POA: Diagnosis not present

## 2022-08-06 DIAGNOSIS — J301 Allergic rhinitis due to pollen: Secondary | ICD-10-CM | POA: Diagnosis not present

## 2022-08-06 DIAGNOSIS — J3081 Allergic rhinitis due to animal (cat) (dog) hair and dander: Secondary | ICD-10-CM | POA: Diagnosis not present

## 2022-08-09 DIAGNOSIS — J301 Allergic rhinitis due to pollen: Secondary | ICD-10-CM | POA: Diagnosis not present

## 2022-08-09 DIAGNOSIS — J3081 Allergic rhinitis due to animal (cat) (dog) hair and dander: Secondary | ICD-10-CM | POA: Diagnosis not present

## 2022-08-09 DIAGNOSIS — J3089 Other allergic rhinitis: Secondary | ICD-10-CM | POA: Diagnosis not present

## 2022-08-12 DIAGNOSIS — J301 Allergic rhinitis due to pollen: Secondary | ICD-10-CM | POA: Diagnosis not present

## 2022-08-12 DIAGNOSIS — J3081 Allergic rhinitis due to animal (cat) (dog) hair and dander: Secondary | ICD-10-CM | POA: Diagnosis not present

## 2022-08-12 DIAGNOSIS — J3089 Other allergic rhinitis: Secondary | ICD-10-CM | POA: Diagnosis not present

## 2022-08-15 DIAGNOSIS — J301 Allergic rhinitis due to pollen: Secondary | ICD-10-CM | POA: Diagnosis not present

## 2022-08-15 DIAGNOSIS — J3089 Other allergic rhinitis: Secondary | ICD-10-CM | POA: Diagnosis not present

## 2022-08-15 DIAGNOSIS — J3081 Allergic rhinitis due to animal (cat) (dog) hair and dander: Secondary | ICD-10-CM | POA: Diagnosis not present

## 2022-08-16 DIAGNOSIS — R413 Other amnesia: Secondary | ICD-10-CM | POA: Diagnosis not present

## 2022-08-16 DIAGNOSIS — Z0000 Encounter for general adult medical examination without abnormal findings: Secondary | ICD-10-CM | POA: Diagnosis not present

## 2022-08-16 DIAGNOSIS — Z13220 Encounter for screening for lipoid disorders: Secondary | ICD-10-CM | POA: Diagnosis not present

## 2022-08-16 DIAGNOSIS — E538 Deficiency of other specified B group vitamins: Secondary | ICD-10-CM | POA: Diagnosis not present

## 2022-08-16 DIAGNOSIS — R21 Rash and other nonspecific skin eruption: Secondary | ICD-10-CM | POA: Diagnosis not present

## 2022-08-19 DIAGNOSIS — J3081 Allergic rhinitis due to animal (cat) (dog) hair and dander: Secondary | ICD-10-CM | POA: Diagnosis not present

## 2022-08-19 DIAGNOSIS — J301 Allergic rhinitis due to pollen: Secondary | ICD-10-CM | POA: Diagnosis not present

## 2022-08-19 DIAGNOSIS — J3089 Other allergic rhinitis: Secondary | ICD-10-CM | POA: Diagnosis not present

## 2022-08-22 DIAGNOSIS — J3081 Allergic rhinitis due to animal (cat) (dog) hair and dander: Secondary | ICD-10-CM | POA: Diagnosis not present

## 2022-08-22 DIAGNOSIS — J301 Allergic rhinitis due to pollen: Secondary | ICD-10-CM | POA: Diagnosis not present

## 2022-08-22 DIAGNOSIS — J3089 Other allergic rhinitis: Secondary | ICD-10-CM | POA: Diagnosis not present

## 2022-08-26 ENCOUNTER — Other Ambulatory Visit: Payer: Self-pay | Admitting: Obstetrics and Gynecology

## 2022-08-26 DIAGNOSIS — N6489 Other specified disorders of breast: Secondary | ICD-10-CM

## 2022-08-26 DIAGNOSIS — J301 Allergic rhinitis due to pollen: Secondary | ICD-10-CM | POA: Diagnosis not present

## 2022-08-26 DIAGNOSIS — J3089 Other allergic rhinitis: Secondary | ICD-10-CM | POA: Diagnosis not present

## 2022-08-26 DIAGNOSIS — J3081 Allergic rhinitis due to animal (cat) (dog) hair and dander: Secondary | ICD-10-CM | POA: Diagnosis not present

## 2022-08-29 DIAGNOSIS — J301 Allergic rhinitis due to pollen: Secondary | ICD-10-CM | POA: Diagnosis not present

## 2022-08-29 DIAGNOSIS — J3089 Other allergic rhinitis: Secondary | ICD-10-CM | POA: Diagnosis not present

## 2022-08-29 DIAGNOSIS — J3081 Allergic rhinitis due to animal (cat) (dog) hair and dander: Secondary | ICD-10-CM | POA: Diagnosis not present

## 2022-09-02 DIAGNOSIS — J3081 Allergic rhinitis due to animal (cat) (dog) hair and dander: Secondary | ICD-10-CM | POA: Diagnosis not present

## 2022-09-02 DIAGNOSIS — J301 Allergic rhinitis due to pollen: Secondary | ICD-10-CM | POA: Diagnosis not present

## 2022-09-02 DIAGNOSIS — J3089 Other allergic rhinitis: Secondary | ICD-10-CM | POA: Diagnosis not present

## 2022-09-05 DIAGNOSIS — J3089 Other allergic rhinitis: Secondary | ICD-10-CM | POA: Diagnosis not present

## 2022-09-05 DIAGNOSIS — J301 Allergic rhinitis due to pollen: Secondary | ICD-10-CM | POA: Diagnosis not present

## 2022-09-05 DIAGNOSIS — J3081 Allergic rhinitis due to animal (cat) (dog) hair and dander: Secondary | ICD-10-CM | POA: Diagnosis not present

## 2022-09-09 DIAGNOSIS — J3089 Other allergic rhinitis: Secondary | ICD-10-CM | POA: Diagnosis not present

## 2022-09-09 DIAGNOSIS — J301 Allergic rhinitis due to pollen: Secondary | ICD-10-CM | POA: Diagnosis not present

## 2022-09-09 DIAGNOSIS — J3081 Allergic rhinitis due to animal (cat) (dog) hair and dander: Secondary | ICD-10-CM | POA: Diagnosis not present

## 2022-09-10 DIAGNOSIS — Z713 Dietary counseling and surveillance: Secondary | ICD-10-CM | POA: Diagnosis not present

## 2022-09-13 DIAGNOSIS — J301 Allergic rhinitis due to pollen: Secondary | ICD-10-CM | POA: Diagnosis not present

## 2022-09-13 DIAGNOSIS — J3081 Allergic rhinitis due to animal (cat) (dog) hair and dander: Secondary | ICD-10-CM | POA: Diagnosis not present

## 2022-09-13 DIAGNOSIS — J3089 Other allergic rhinitis: Secondary | ICD-10-CM | POA: Diagnosis not present

## 2022-09-17 DIAGNOSIS — J3089 Other allergic rhinitis: Secondary | ICD-10-CM | POA: Diagnosis not present

## 2022-09-17 DIAGNOSIS — J3081 Allergic rhinitis due to animal (cat) (dog) hair and dander: Secondary | ICD-10-CM | POA: Diagnosis not present

## 2022-09-17 DIAGNOSIS — J301 Allergic rhinitis due to pollen: Secondary | ICD-10-CM | POA: Diagnosis not present

## 2022-09-20 DIAGNOSIS — J3089 Other allergic rhinitis: Secondary | ICD-10-CM | POA: Diagnosis not present

## 2022-09-20 DIAGNOSIS — J3081 Allergic rhinitis due to animal (cat) (dog) hair and dander: Secondary | ICD-10-CM | POA: Diagnosis not present

## 2022-09-20 DIAGNOSIS — J301 Allergic rhinitis due to pollen: Secondary | ICD-10-CM | POA: Diagnosis not present

## 2022-09-26 DIAGNOSIS — R21 Rash and other nonspecific skin eruption: Secondary | ICD-10-CM | POA: Diagnosis not present

## 2022-09-26 DIAGNOSIS — D8989 Other specified disorders involving the immune mechanism, not elsewhere classified: Secondary | ICD-10-CM | POA: Diagnosis not present

## 2022-09-26 DIAGNOSIS — H04129 Dry eye syndrome of unspecified lacrimal gland: Secondary | ICD-10-CM | POA: Diagnosis not present

## 2022-09-26 DIAGNOSIS — J3081 Allergic rhinitis due to animal (cat) (dog) hair and dander: Secondary | ICD-10-CM | POA: Diagnosis not present

## 2022-09-26 DIAGNOSIS — R768 Other specified abnormal immunological findings in serum: Secondary | ICD-10-CM | POA: Diagnosis not present

## 2022-09-26 DIAGNOSIS — Z79899 Other long term (current) drug therapy: Secondary | ICD-10-CM | POA: Diagnosis not present

## 2022-09-26 DIAGNOSIS — L568 Other specified acute skin changes due to ultraviolet radiation: Secondary | ICD-10-CM | POA: Diagnosis not present

## 2022-09-26 DIAGNOSIS — J3089 Other allergic rhinitis: Secondary | ICD-10-CM | POA: Diagnosis not present

## 2022-09-26 DIAGNOSIS — J301 Allergic rhinitis due to pollen: Secondary | ICD-10-CM | POA: Diagnosis not present

## 2022-10-02 DIAGNOSIS — J301 Allergic rhinitis due to pollen: Secondary | ICD-10-CM | POA: Diagnosis not present

## 2022-10-02 DIAGNOSIS — J3089 Other allergic rhinitis: Secondary | ICD-10-CM | POA: Diagnosis not present

## 2022-10-02 DIAGNOSIS — J3081 Allergic rhinitis due to animal (cat) (dog) hair and dander: Secondary | ICD-10-CM | POA: Diagnosis not present

## 2022-10-03 DIAGNOSIS — L308 Other specified dermatitis: Secondary | ICD-10-CM | POA: Diagnosis not present

## 2022-10-03 DIAGNOSIS — D485 Neoplasm of uncertain behavior of skin: Secondary | ICD-10-CM | POA: Diagnosis not present

## 2022-10-03 DIAGNOSIS — L209 Atopic dermatitis, unspecified: Secondary | ICD-10-CM | POA: Diagnosis not present

## 2022-10-03 DIAGNOSIS — L989 Disorder of the skin and subcutaneous tissue, unspecified: Secondary | ICD-10-CM | POA: Diagnosis not present

## 2022-10-09 DIAGNOSIS — J301 Allergic rhinitis due to pollen: Secondary | ICD-10-CM | POA: Diagnosis not present

## 2022-10-09 DIAGNOSIS — J3089 Other allergic rhinitis: Secondary | ICD-10-CM | POA: Diagnosis not present

## 2022-10-09 DIAGNOSIS — J3081 Allergic rhinitis due to animal (cat) (dog) hair and dander: Secondary | ICD-10-CM | POA: Diagnosis not present

## 2022-10-16 DIAGNOSIS — J3089 Other allergic rhinitis: Secondary | ICD-10-CM | POA: Diagnosis not present

## 2022-10-16 DIAGNOSIS — J3081 Allergic rhinitis due to animal (cat) (dog) hair and dander: Secondary | ICD-10-CM | POA: Diagnosis not present

## 2022-10-16 DIAGNOSIS — J301 Allergic rhinitis due to pollen: Secondary | ICD-10-CM | POA: Diagnosis not present

## 2022-10-22 DIAGNOSIS — J301 Allergic rhinitis due to pollen: Secondary | ICD-10-CM | POA: Diagnosis not present

## 2022-10-22 DIAGNOSIS — J3081 Allergic rhinitis due to animal (cat) (dog) hair and dander: Secondary | ICD-10-CM | POA: Diagnosis not present

## 2022-10-22 DIAGNOSIS — J3089 Other allergic rhinitis: Secondary | ICD-10-CM | POA: Diagnosis not present

## 2022-10-23 DIAGNOSIS — L309 Dermatitis, unspecified: Secondary | ICD-10-CM | POA: Diagnosis not present

## 2022-10-23 DIAGNOSIS — L818 Other specified disorders of pigmentation: Secondary | ICD-10-CM | POA: Diagnosis not present

## 2022-10-31 DIAGNOSIS — J3089 Other allergic rhinitis: Secondary | ICD-10-CM | POA: Diagnosis not present

## 2022-10-31 DIAGNOSIS — J301 Allergic rhinitis due to pollen: Secondary | ICD-10-CM | POA: Diagnosis not present

## 2022-10-31 DIAGNOSIS — J3081 Allergic rhinitis due to animal (cat) (dog) hair and dander: Secondary | ICD-10-CM | POA: Diagnosis not present

## 2022-11-05 DIAGNOSIS — Z713 Dietary counseling and surveillance: Secondary | ICD-10-CM | POA: Diagnosis not present

## 2022-11-06 DIAGNOSIS — J3089 Other allergic rhinitis: Secondary | ICD-10-CM | POA: Diagnosis not present

## 2022-11-06 DIAGNOSIS — J3081 Allergic rhinitis due to animal (cat) (dog) hair and dander: Secondary | ICD-10-CM | POA: Diagnosis not present

## 2022-11-06 DIAGNOSIS — J301 Allergic rhinitis due to pollen: Secondary | ICD-10-CM | POA: Diagnosis not present

## 2022-11-07 DIAGNOSIS — Z6824 Body mass index (BMI) 24.0-24.9, adult: Secondary | ICD-10-CM | POA: Diagnosis not present

## 2022-11-07 DIAGNOSIS — Z01419 Encounter for gynecological examination (general) (routine) without abnormal findings: Secondary | ICD-10-CM | POA: Diagnosis not present

## 2022-11-18 ENCOUNTER — Ambulatory Visit
Admission: RE | Admit: 2022-11-18 | Discharge: 2022-11-18 | Disposition: A | Discharge status: Home / Self Care | Payer: BC Managed Care – PPO | Source: Ambulatory Visit | Attending: Obstetrics and Gynecology | Admitting: Obstetrics and Gynecology

## 2022-11-18 DIAGNOSIS — N6489 Other specified disorders of breast: Secondary | ICD-10-CM

## 2022-11-18 DIAGNOSIS — R928 Other abnormal and inconclusive findings on diagnostic imaging of breast: Secondary | ICD-10-CM | POA: Diagnosis not present

## 2022-11-26 DIAGNOSIS — J3089 Other allergic rhinitis: Secondary | ICD-10-CM | POA: Diagnosis not present

## 2022-11-26 DIAGNOSIS — J3081 Allergic rhinitis due to animal (cat) (dog) hair and dander: Secondary | ICD-10-CM | POA: Diagnosis not present

## 2022-11-26 DIAGNOSIS — J301 Allergic rhinitis due to pollen: Secondary | ICD-10-CM | POA: Diagnosis not present

## 2022-12-05 DIAGNOSIS — J301 Allergic rhinitis due to pollen: Secondary | ICD-10-CM | POA: Diagnosis not present

## 2022-12-05 DIAGNOSIS — J45990 Exercise induced bronchospasm: Secondary | ICD-10-CM | POA: Diagnosis not present

## 2022-12-05 DIAGNOSIS — J3089 Other allergic rhinitis: Secondary | ICD-10-CM | POA: Diagnosis not present

## 2022-12-05 DIAGNOSIS — J3081 Allergic rhinitis due to animal (cat) (dog) hair and dander: Secondary | ICD-10-CM | POA: Diagnosis not present

## 2023-01-01 DIAGNOSIS — L280 Lichen simplex chronicus: Secondary | ICD-10-CM | POA: Diagnosis not present

## 2023-01-01 DIAGNOSIS — L309 Dermatitis, unspecified: Secondary | ICD-10-CM | POA: Diagnosis not present

## 2023-01-14 DIAGNOSIS — Z713 Dietary counseling and surveillance: Secondary | ICD-10-CM | POA: Diagnosis not present

## 2023-01-16 DIAGNOSIS — L409 Psoriasis, unspecified: Secondary | ICD-10-CM | POA: Diagnosis not present

## 2023-01-16 DIAGNOSIS — R21 Rash and other nonspecific skin eruption: Secondary | ICD-10-CM | POA: Diagnosis not present

## 2023-01-16 DIAGNOSIS — R768 Other specified abnormal immunological findings in serum: Secondary | ICD-10-CM | POA: Diagnosis not present

## 2023-01-16 DIAGNOSIS — L4050 Arthropathic psoriasis, unspecified: Secondary | ICD-10-CM | POA: Diagnosis not present

## 2023-01-22 DIAGNOSIS — L400 Psoriasis vulgaris: Secondary | ICD-10-CM | POA: Diagnosis not present

## 2023-02-13 DIAGNOSIS — L4050 Arthropathic psoriasis, unspecified: Secondary | ICD-10-CM | POA: Diagnosis not present

## 2023-03-04 DIAGNOSIS — Z713 Dietary counseling and surveillance: Secondary | ICD-10-CM | POA: Diagnosis not present

## 2023-03-13 DIAGNOSIS — L4050 Arthropathic psoriasis, unspecified: Secondary | ICD-10-CM | POA: Diagnosis not present

## 2023-04-03 DIAGNOSIS — L814 Other melanin hyperpigmentation: Secondary | ICD-10-CM | POA: Diagnosis not present

## 2023-04-03 DIAGNOSIS — L578 Other skin changes due to chronic exposure to nonionizing radiation: Secondary | ICD-10-CM | POA: Diagnosis not present

## 2023-04-03 DIAGNOSIS — Z86018 Personal history of other benign neoplasm: Secondary | ICD-10-CM | POA: Diagnosis not present

## 2023-04-03 DIAGNOSIS — D229 Melanocytic nevi, unspecified: Secondary | ICD-10-CM | POA: Diagnosis not present

## 2023-04-10 DIAGNOSIS — L409 Psoriasis, unspecified: Secondary | ICD-10-CM | POA: Diagnosis not present

## 2023-04-10 DIAGNOSIS — L4050 Arthropathic psoriasis, unspecified: Secondary | ICD-10-CM | POA: Diagnosis not present

## 2023-04-10 DIAGNOSIS — R21 Rash and other nonspecific skin eruption: Secondary | ICD-10-CM | POA: Diagnosis not present

## 2023-04-10 DIAGNOSIS — R768 Other specified abnormal immunological findings in serum: Secondary | ICD-10-CM | POA: Diagnosis not present

## 2023-04-24 DIAGNOSIS — L400 Psoriasis vulgaris: Secondary | ICD-10-CM | POA: Diagnosis not present

## 2023-04-25 DIAGNOSIS — J019 Acute sinusitis, unspecified: Secondary | ICD-10-CM | POA: Diagnosis not present

## 2023-05-08 DIAGNOSIS — Z713 Dietary counseling and surveillance: Secondary | ICD-10-CM | POA: Diagnosis not present

## 2023-07-11 DIAGNOSIS — Z79899 Other long term (current) drug therapy: Secondary | ICD-10-CM | POA: Diagnosis not present

## 2023-07-11 DIAGNOSIS — L4050 Arthropathic psoriasis, unspecified: Secondary | ICD-10-CM | POA: Diagnosis not present

## 2023-07-14 DIAGNOSIS — M545 Low back pain, unspecified: Secondary | ICD-10-CM | POA: Diagnosis not present

## 2023-07-22 DIAGNOSIS — Z713 Dietary counseling and surveillance: Secondary | ICD-10-CM | POA: Diagnosis not present

## 2023-08-14 DIAGNOSIS — G8929 Other chronic pain: Secondary | ICD-10-CM | POA: Diagnosis not present

## 2023-08-14 DIAGNOSIS — M47816 Spondylosis without myelopathy or radiculopathy, lumbar region: Secondary | ICD-10-CM | POA: Diagnosis not present

## 2023-08-26 DIAGNOSIS — M6281 Muscle weakness (generalized): Secondary | ICD-10-CM | POA: Diagnosis not present

## 2023-08-26 DIAGNOSIS — M47816 Spondylosis without myelopathy or radiculopathy, lumbar region: Secondary | ICD-10-CM | POA: Diagnosis not present

## 2023-08-26 DIAGNOSIS — M62830 Muscle spasm of back: Secondary | ICD-10-CM | POA: Diagnosis not present

## 2023-08-28 DIAGNOSIS — M62830 Muscle spasm of back: Secondary | ICD-10-CM | POA: Diagnosis not present

## 2023-08-28 DIAGNOSIS — M6281 Muscle weakness (generalized): Secondary | ICD-10-CM | POA: Diagnosis not present

## 2023-08-28 DIAGNOSIS — M47816 Spondylosis without myelopathy or radiculopathy, lumbar region: Secondary | ICD-10-CM | POA: Diagnosis not present

## 2023-09-01 DIAGNOSIS — M62830 Muscle spasm of back: Secondary | ICD-10-CM | POA: Diagnosis not present

## 2023-09-01 DIAGNOSIS — M47816 Spondylosis without myelopathy or radiculopathy, lumbar region: Secondary | ICD-10-CM | POA: Diagnosis not present

## 2023-09-01 DIAGNOSIS — M6281 Muscle weakness (generalized): Secondary | ICD-10-CM | POA: Diagnosis not present

## 2023-09-02 DIAGNOSIS — Z13220 Encounter for screening for lipoid disorders: Secondary | ICD-10-CM | POA: Diagnosis not present

## 2023-09-02 DIAGNOSIS — L4050 Arthropathic psoriasis, unspecified: Secondary | ICD-10-CM | POA: Diagnosis not present

## 2023-09-02 DIAGNOSIS — R768 Other specified abnormal immunological findings in serum: Secondary | ICD-10-CM | POA: Diagnosis not present

## 2023-09-02 DIAGNOSIS — E538 Deficiency of other specified B group vitamins: Secondary | ICD-10-CM | POA: Diagnosis not present

## 2023-09-02 DIAGNOSIS — Z0000 Encounter for general adult medical examination without abnormal findings: Secondary | ICD-10-CM | POA: Diagnosis not present

## 2023-09-03 DIAGNOSIS — M47816 Spondylosis without myelopathy or radiculopathy, lumbar region: Secondary | ICD-10-CM | POA: Diagnosis not present

## 2023-09-03 DIAGNOSIS — M6281 Muscle weakness (generalized): Secondary | ICD-10-CM | POA: Diagnosis not present

## 2023-09-03 DIAGNOSIS — M62830 Muscle spasm of back: Secondary | ICD-10-CM | POA: Diagnosis not present

## 2023-09-08 DIAGNOSIS — M62830 Muscle spasm of back: Secondary | ICD-10-CM | POA: Diagnosis not present

## 2023-09-08 DIAGNOSIS — M6281 Muscle weakness (generalized): Secondary | ICD-10-CM | POA: Diagnosis not present

## 2023-09-08 DIAGNOSIS — M47816 Spondylosis without myelopathy or radiculopathy, lumbar region: Secondary | ICD-10-CM | POA: Diagnosis not present

## 2023-09-10 DIAGNOSIS — M47816 Spondylosis without myelopathy or radiculopathy, lumbar region: Secondary | ICD-10-CM | POA: Diagnosis not present

## 2023-09-10 DIAGNOSIS — M6281 Muscle weakness (generalized): Secondary | ICD-10-CM | POA: Diagnosis not present

## 2023-09-10 DIAGNOSIS — M62830 Muscle spasm of back: Secondary | ICD-10-CM | POA: Diagnosis not present

## 2023-09-16 DIAGNOSIS — M6281 Muscle weakness (generalized): Secondary | ICD-10-CM | POA: Diagnosis not present

## 2023-09-16 DIAGNOSIS — M47816 Spondylosis without myelopathy or radiculopathy, lumbar region: Secondary | ICD-10-CM | POA: Diagnosis not present

## 2023-09-16 DIAGNOSIS — M62830 Muscle spasm of back: Secondary | ICD-10-CM | POA: Diagnosis not present

## 2023-09-18 DIAGNOSIS — R768 Other specified abnormal immunological findings in serum: Secondary | ICD-10-CM | POA: Diagnosis not present

## 2023-09-18 DIAGNOSIS — R21 Rash and other nonspecific skin eruption: Secondary | ICD-10-CM | POA: Diagnosis not present

## 2023-09-18 DIAGNOSIS — L4050 Arthropathic psoriasis, unspecified: Secondary | ICD-10-CM | POA: Diagnosis not present

## 2023-09-18 DIAGNOSIS — M62830 Muscle spasm of back: Secondary | ICD-10-CM | POA: Diagnosis not present

## 2023-09-18 DIAGNOSIS — M6281 Muscle weakness (generalized): Secondary | ICD-10-CM | POA: Diagnosis not present

## 2023-09-18 DIAGNOSIS — L409 Psoriasis, unspecified: Secondary | ICD-10-CM | POA: Diagnosis not present

## 2023-09-18 DIAGNOSIS — M47816 Spondylosis without myelopathy or radiculopathy, lumbar region: Secondary | ICD-10-CM | POA: Diagnosis not present

## 2023-10-08 DIAGNOSIS — L400 Psoriasis vulgaris: Secondary | ICD-10-CM | POA: Diagnosis not present

## 2023-10-16 DIAGNOSIS — L4050 Arthropathic psoriasis, unspecified: Secondary | ICD-10-CM | POA: Diagnosis not present

## 2023-10-28 DIAGNOSIS — Z713 Dietary counseling and surveillance: Secondary | ICD-10-CM | POA: Diagnosis not present

## 2023-12-18 DIAGNOSIS — R21 Rash and other nonspecific skin eruption: Secondary | ICD-10-CM | POA: Diagnosis not present

## 2023-12-18 DIAGNOSIS — L409 Psoriasis, unspecified: Secondary | ICD-10-CM | POA: Diagnosis not present

## 2023-12-18 DIAGNOSIS — Z111 Encounter for screening for respiratory tuberculosis: Secondary | ICD-10-CM | POA: Diagnosis not present

## 2023-12-18 DIAGNOSIS — J4520 Mild intermittent asthma, uncomplicated: Secondary | ICD-10-CM | POA: Diagnosis not present

## 2023-12-18 DIAGNOSIS — L4050 Arthropathic psoriasis, unspecified: Secondary | ICD-10-CM | POA: Diagnosis not present

## 2023-12-18 DIAGNOSIS — H1045 Other chronic allergic conjunctivitis: Secondary | ICD-10-CM | POA: Diagnosis not present

## 2024-01-14 DIAGNOSIS — L400 Psoriasis vulgaris: Secondary | ICD-10-CM | POA: Diagnosis not present

## 2024-01-20 DIAGNOSIS — Z124 Encounter for screening for malignant neoplasm of cervix: Secondary | ICD-10-CM | POA: Diagnosis not present

## 2024-01-20 DIAGNOSIS — Z1151 Encounter for screening for human papillomavirus (HPV): Secondary | ICD-10-CM | POA: Diagnosis not present

## 2024-01-20 DIAGNOSIS — Z1231 Encounter for screening mammogram for malignant neoplasm of breast: Secondary | ICD-10-CM | POA: Diagnosis not present

## 2024-01-20 DIAGNOSIS — Z6824 Body mass index (BMI) 24.0-24.9, adult: Secondary | ICD-10-CM | POA: Diagnosis not present

## 2024-01-20 DIAGNOSIS — Z01419 Encounter for gynecological examination (general) (routine) without abnormal findings: Secondary | ICD-10-CM | POA: Diagnosis not present

## 2024-02-10 DIAGNOSIS — Z713 Dietary counseling and surveillance: Secondary | ICD-10-CM | POA: Diagnosis not present

## 2024-03-08 DIAGNOSIS — L409 Psoriasis, unspecified: Secondary | ICD-10-CM | POA: Diagnosis not present

## 2024-03-08 DIAGNOSIS — M2560 Stiffness of unspecified joint, not elsewhere classified: Secondary | ICD-10-CM | POA: Diagnosis not present

## 2024-03-08 DIAGNOSIS — L4050 Arthropathic psoriasis, unspecified: Secondary | ICD-10-CM | POA: Diagnosis not present

## 2024-03-08 DIAGNOSIS — R21 Rash and other nonspecific skin eruption: Secondary | ICD-10-CM | POA: Diagnosis not present

## 2024-03-30 DIAGNOSIS — L400 Psoriasis vulgaris: Secondary | ICD-10-CM | POA: Diagnosis not present

## 2024-04-19 DIAGNOSIS — L4050 Arthropathic psoriasis, unspecified: Secondary | ICD-10-CM | POA: Diagnosis not present
